# Patient Record
Sex: Male | Born: 1960 | Race: Black or African American | Hispanic: No | Marital: Single | Smoking: Former smoker
Health system: Southern US, Community
[De-identification: ages and names within clinical notes are randomized; demographics above are authoritative.]

## PROBLEM LIST (undated history)

## (undated) DIAGNOSIS — R0789 Other chest pain: Secondary | ICD-10-CM

## (undated) DIAGNOSIS — D689 Coagulation defect, unspecified: Secondary | ICD-10-CM

## (undated) DIAGNOSIS — I639 Cerebral infarction, unspecified: Secondary | ICD-10-CM

## (undated) DIAGNOSIS — Z8639 Personal history of other endocrine, nutritional and metabolic disease: Secondary | ICD-10-CM

## (undated) DIAGNOSIS — I10 Essential (primary) hypertension: Secondary | ICD-10-CM

## (undated) DIAGNOSIS — R29898 Other symptoms and signs involving the musculoskeletal system: Secondary | ICD-10-CM

## (undated) DIAGNOSIS — I69359 Hemiplegia and hemiparesis following cerebral infarction affecting unspecified side: Secondary | ICD-10-CM

## (undated) DIAGNOSIS — E119 Type 2 diabetes mellitus without complications: Secondary | ICD-10-CM

## (undated) DIAGNOSIS — E7800 Pure hypercholesterolemia, unspecified: Secondary | ICD-10-CM

## (undated) DIAGNOSIS — K2970 Gastritis, unspecified, without bleeding: Secondary | ICD-10-CM

## (undated) DIAGNOSIS — I69398 Other sequelae of cerebral infarction: Principal | ICD-10-CM

## (undated) DIAGNOSIS — D649 Anemia, unspecified: Secondary | ICD-10-CM

## (undated) DIAGNOSIS — N184 Chronic kidney disease, stage 4 (severe): Secondary | ICD-10-CM

## (undated) DIAGNOSIS — R002 Palpitations: Secondary | ICD-10-CM

## (undated) DIAGNOSIS — N289 Disorder of kidney and ureter, unspecified: Secondary | ICD-10-CM

## (undated) HISTORY — DX: Other symptoms and signs involving the musculoskeletal system: R29.898

## (undated) HISTORY — DX: Palpitations: R00.2

## (undated) HISTORY — DX: Personal history of other endocrine, nutritional and metabolic disease: Z86.39

## (undated) HISTORY — DX: Gastritis, unspecified, without bleeding: K29.70

## (undated) HISTORY — PX: COLONOSCOPY: SHX174

## (undated) HISTORY — PX: ESOPHAGOGASTRODUODENOSCOPY: SHX1529

## (undated) HISTORY — DX: Hemiplegia and hemiparesis following cerebral infarction affecting unspecified side: I69.359

## (undated) HISTORY — DX: Other chest pain: R07.89

## (undated) HISTORY — PX: AV FISTULA PLACEMENT: SHX1204

## (undated) HISTORY — DX: Coagulation defect, unspecified: D68.9

## (undated) HISTORY — DX: Other sequelae of cerebral infarction: I69.398

## (undated) HISTORY — DX: Anemia, unspecified: D64.9

## (undated) HISTORY — PX: LUMBAR SPINE SURGERY: SHX701

---
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
## 1999-07-26 ENCOUNTER — Emergency Department (HOSPITAL_COMMUNITY): Admission: EM | Admit: 1999-07-26 | Discharge: 1999-07-26 | Payer: Self-pay | Admitting: Podiatry

## 1999-07-27 ENCOUNTER — Encounter: Payer: Self-pay | Admitting: Emergency Medicine

## 2000-05-07 HISTORY — PX: REPAIR OF FRACTURED PENIS: SHX6063

## 2000-05-28 ENCOUNTER — Observation Stay (HOSPITAL_COMMUNITY): Admission: EM | Admit: 2000-05-28 | Discharge: 2000-05-28 | Payer: Self-pay | Admitting: Emergency Medicine

## 2004-05-11 ENCOUNTER — Observation Stay (HOSPITAL_COMMUNITY): Admission: RE | Admit: 2004-05-11 | Discharge: 2004-05-12 | Payer: Self-pay | Admitting: Specialist

## 2006-11-08 ENCOUNTER — Inpatient Hospital Stay (HOSPITAL_COMMUNITY): Admission: RE | Admit: 2006-11-08 | Discharge: 2006-11-09 | Payer: Self-pay | Admitting: Specialist

## 2008-11-19 ENCOUNTER — Inpatient Hospital Stay (HOSPITAL_COMMUNITY): Admission: AD | Admit: 2008-11-19 | Discharge: 2008-11-22 | Payer: Self-pay | Admitting: Specialist

## 2008-12-01 ENCOUNTER — Encounter: Payer: Self-pay | Admitting: Internal Medicine

## 2009-02-12 ENCOUNTER — Ambulatory Visit: Payer: Self-pay | Admitting: Internal Medicine

## 2009-02-12 DIAGNOSIS — K921 Melena: Secondary | ICD-10-CM

## 2009-02-12 DIAGNOSIS — E119 Type 2 diabetes mellitus without complications: Secondary | ICD-10-CM

## 2009-03-01 ENCOUNTER — Telehealth: Payer: Self-pay | Admitting: Internal Medicine

## 2009-03-03 ENCOUNTER — Telehealth: Payer: Self-pay | Admitting: Internal Medicine

## 2009-03-05 ENCOUNTER — Ambulatory Visit: Payer: Self-pay | Admitting: Internal Medicine

## 2010-10-06 ENCOUNTER — Other Ambulatory Visit: Payer: Self-pay | Admitting: Nephrology

## 2010-10-06 DIAGNOSIS — I10 Essential (primary) hypertension: Secondary | ICD-10-CM

## 2010-10-06 DIAGNOSIS — N179 Acute kidney failure, unspecified: Secondary | ICD-10-CM

## 2010-10-12 ENCOUNTER — Ambulatory Visit
Admission: RE | Admit: 2010-10-12 | Discharge: 2010-10-12 | Disposition: A | Discharge status: Home / Self Care | Payer: Managed Care, Other (non HMO) | Source: Ambulatory Visit | Attending: Nephrology | Admitting: Nephrology

## 2010-10-12 DIAGNOSIS — N179 Acute kidney failure, unspecified: Secondary | ICD-10-CM

## 2010-10-12 DIAGNOSIS — I10 Essential (primary) hypertension: Secondary | ICD-10-CM

## 2010-11-13 LAB — GLUCOSE, CAPILLARY
Glucose-Capillary: 27 mg/dL — CL (ref 70–99)
Glucose-Capillary: 29 mg/dL — CL (ref 70–99)
Glucose-Capillary: 39 mg/dL — CL (ref 70–99)
Glucose-Capillary: 42 mg/dL — ABNORMAL LOW (ref 70–99)

## 2010-11-16 LAB — BASIC METABOLIC PANEL
BUN: 10 mg/dL (ref 6–23)
BUN: 11 mg/dL (ref 6–23)
BUN: 17 mg/dL (ref 6–23)
CO2: 27 mEq/L (ref 19–32)
CO2: 29 mEq/L (ref 19–32)
CO2: 29 mEq/L (ref 19–32)
Calcium: 8.1 mg/dL — ABNORMAL LOW (ref 8.4–10.5)
Chloride: 106 mEq/L (ref 96–112)
Chloride: 108 mEq/L (ref 96–112)
Creatinine, Ser: 1.22 mg/dL (ref 0.4–1.5)
GFR calc non Af Amer: >60 mL/min (ref >60)
GFR calc non Af Amer: >60 mL/min (ref >60)
Glucose, Bld: 137 mg/dL — ABNORMAL HIGH (ref 70–99)
Glucose, Bld: 50 mg/dL — ABNORMAL LOW (ref 70–99)
Glucose, Bld: 185 mg/dL — ABNORMAL HIGH (ref 70–99)
Potassium: 3.5 mEq/L (ref 3.5–5.1)
Sodium: 137 mEq/L (ref 135–145)
Sodium: 138 mEq/L (ref 135–145)

## 2010-11-16 LAB — GLUCOSE, CAPILLARY
Glucose-Capillary: 114 mg/dL — ABNORMAL HIGH (ref 70–99)
Glucose-Capillary: 147 mg/dL — ABNORMAL HIGH (ref 70–99)
Glucose-Capillary: 149 mg/dL — ABNORMAL HIGH (ref 70–99)
Glucose-Capillary: 157 mg/dL — ABNORMAL HIGH (ref 70–99)
Glucose-Capillary: 195 mg/dL — ABNORMAL HIGH (ref 70–99)
Glucose-Capillary: 224 mg/dL — ABNORMAL HIGH (ref 70–99)
Glucose-Capillary: 358 mg/dL — ABNORMAL HIGH (ref 70–99)
Glucose-Capillary: 135 mg/dL — ABNORMAL HIGH (ref 70–99)
Glucose-Capillary: 143 mg/dL — ABNORMAL HIGH (ref 70–99)
Glucose-Capillary: 61 mg/dL — ABNORMAL LOW (ref 70–99)
Glucose-Capillary: 70 mg/dL (ref 70–99)

## 2010-11-16 LAB — CBC
HCT: 33 % — ABNORMAL LOW (ref 39.0–52.0)
HCT: 38.7 % — ABNORMAL LOW (ref 39.0–52.0)
Hemoglobin: 10 g/dL — ABNORMAL LOW (ref 13.0–17.0)
Hemoglobin: 10.4 g/dL — ABNORMAL LOW (ref 13.0–17.0)
MCHC: 31.6 g/dL (ref 30.0–36.0)
MCHC: 32.1 g/dL (ref 30.0–36.0)
MCHC: 31.8 g/dL (ref 30.0–36.0)
MCV: 79.2 fL (ref 78.0–100.0)
MCV: 79.9 fL (ref 78.0–100.0)
MCV: 79.7 fL (ref 78.0–100.0)
Platelets: 249 10*3/uL (ref 150–400)
RBC: 3.94 MIL/uL — ABNORMAL LOW (ref 4.22–5.81)
RBC: 4.13 MIL/uL — ABNORMAL LOW (ref 4.22–5.81)
RDW: 14.6 % (ref 11.5–15.5)
RDW: 14.9 % (ref 11.5–15.5)
WBC: 7.7 10*3/uL (ref 4.0–10.5)

## 2010-11-16 LAB — URINALYSIS, ROUTINE W REFLEX MICROSCOPIC
Bilirubin Urine: NEGATIVE
Ketones, ur: NEGATIVE mg/dL
Nitrite: NEGATIVE
Specific Gravity, Urine: 1.017 (ref 1.005–1.030)
Urobilinogen, UA: 0.2 mg/dL (ref 0.0–1.0)

## 2010-11-16 LAB — COMPREHENSIVE METABOLIC PANEL
Albumin: 3.5 g/dL (ref 3.5–5.2)
BUN: 22 mg/dL (ref 6–23)
Calcium: 9 mg/dL (ref 8.4–10.5)
Chloride: 107 mEq/L (ref 96–112)
Creatinine, Ser: 1.63 mg/dL — ABNORMAL HIGH (ref 0.4–1.5)
GFR calc non Af Amer: 45 mL/min — ABNORMAL LOW (ref >60)
Total Bilirubin: 0.7 mg/dL (ref 0.3–1.2)

## 2010-11-16 LAB — PROTIME-INR: INR: 1 (ref 0.00–1.49)

## 2010-11-16 LAB — APTT: aPTT: 26 seconds (ref 24–37)

## 2010-12-20 NOTE — Op Note (Signed)
Daniel, Santos NO.:  [id number]   MEDICAL RECORD NO.:  [id number]          PATIENT TYPE:  AMB   LOCATION:  DAY                          FACILITY:  [facility]   PHYSICIAN:  Jene Every, M.D.    DATE OF BIRTH:  1961-05-16   DATE OF PROCEDURE:  DATE OF DISCHARGE:                               OPERATIVE REPORT   PREOPERATIVE DIAGNOSIS:  Spinal stenosis, recurrent disk herniation,  status post decompression 4-5, 5-1.   POSTOPERATIVE DIAGNOSIS:  Spinal stenosis, recurrent disk herniation,  status post decompression 4-5, 5-1.   PROCEDURE PERFORMED:  1. Redo lumbar decompression L4-5 with bilateral hemilaminotomies,      lateral recess decompression, foraminotomies L4.  2. Redo lumbar decompression L5-S1 with bilateral hemilaminotomies,      lateral recess decompression, microdiskectomy L5-S1 left.   ANESTHESIA:  General.   ASSISTANT:  Strader.   BRIEF HISTORY AND INDICATIONS:  The patient is a 50 year old status post  lumbar decompression 4-5 and 5-1.  Has had progressive disk  degeneration.  Had spinal stenosis, central disk protrusion at 4-5,  recurrent disk herniation and small bony fragment L5-S1 on the right  compressing the S1 and 5 roots.  He is indicated for decompression and  has been refractory to conservative treatment.  Risks and benefits  discussed including bleeding, infection, damage to neurovascular  structure, CSF leakage, epidural fibrosis, adjacent segment disease,  need for fusion in the future, anesthetic complications, etc.   With the patient in supine position, after adequate general anesthesia  and 2 g of Kefzol, he was placed prone on the [place] frame.  All bony  prominences were well padded.  Lumbar region was prepped and draped in  the usual sterile fashion.  Incision was made from the spinous process  of L4 to S1.  Subcutaneous tissue was dissected, electrocautery utilized  to achieve hemostasis.  Dorsolumbar fascia identified  and divided in  line with the skin incision.  Paraspinous muscle elevated from lamina at  4-5 and 5-1.  Kocher's placed in the spinous processes of 4 and 5,  confirmed with x-ray.  It was determined preoperatively we would remove  the spinous processes of 4 and 5 and performed complete removal of the  lamina of 5.  He had previous decompression at 4-5 and 5-1 right.  Hemilaminotomy of the caudad edge of 4 was performed with 2-mm  Kerrison's attached to the ligamentum flavum.  We used the operating  microscope.  Ligament flavum attached from the cephalad edge of 5  utilizing the 2-mm Kerrison as well.  Removed the ligamentum flavum from  the interspace centrally after mobilization, protecting the neural  elements.  He had severe lateral recess stenosis bilaterally which was  decompressed at the medial border pedicle utilizing 2-mm Kerrison.  Bipolar electrocautery was utilized to achieve hemostasis.  Then passed  a hockey stick probe cephalad with good epidural fat, no cephalad  compression.  He did have a hard disk at 4-5 on both sides.  There was  fairly significant pressure of the 4 roots.  It was fairly significant,  especially  of the 4 and 5 roots into the lateral recess, and these  decompressed to the medial border of the pedicle.  Following this,  hockey stick probe was passed freely up the foramen of 4.  We then  turned our attention caudad, removing the hemi-lamina of 5 on the right  with a 2-mm Kerrison.  Ligamentum flavum was then removed from the  interspace at 5-1 on the left.  There was compression over the S1 nerve  root into the lateral recesses and decompressed the medial border of the  pedicle.  There was no evidence of a disk herniation here.  Hockey-stick  probe then passed freely at 5 and S1 on the left.  We turned our  attention to the right.  The small bony fragment at L5-S1 on the right  seemed to be the inferior aspect of the facet at that level.  This was   skeletonized and removed with a pituitary root, performed a foraminotomy  of S1 at the caudad edge, gently mobilizing the scar tissue with a  straight curette.  This was fairly significant encasing the S1 and 5  roots.  This was performed at 4-5 on the right as well.  After removal  of the hemi-lamina 5 as well on the right, we decompressed the lateral  recess and the medial border pedicle, took the 5 root out as well.  With  the neural elements well protected and mobilizing the epidural fibrosis,  we identified the disk space at 5-1, entered it with a Penfield 4 and  removed herniated disk material at 5-1 on the right.  This mobilized the  S1 and 5 roots adequately.  Hockey stick probe passed freely out the  foramen of S1 and 5.  Inspection revealed no evidence of CSF leakage or  active bleeding.  There was no instability of the spine noted.  This was  evaluated prior to the decompression as well with a Kocher on the  spinous process.  Next, bipolar cautery was utilized to achieve  hemostasis.  Thrombin-soaked Gelfoam was placed in the laminotomy  defect, removed the [facility] retractor.  Paraspinous muscles  inspected.  Electrocautery utilized to achieve hemostasis.  Was  irrigated copiously, and the wound was irrigated copiously throughout  the case.  Dorsolumbar fascia reapproximated with #1 Vicryl interrupted  figure-of-eight sutures.  Just on the proximal aspect, there was a  slight aperture of a centimeter to allow for evacuation of hematoma if  it occurred.  Subcutaneous tissue reapproximated with 2-0 Vicryl simple  sutures.  Skin was reapproximated with staples and dressed sterilely.  Placed supine on the hospital bed, extubated without difficulty and  transferred to recovery in satisfactory condition.  The patient  tolerated the procedure well.  There were no complications.   ASSISTANT:  Roma Schanz, P.A.   BLOOD LOSS:  100 mL.      Jene Every, M.D.   Electronically Signed     JB/MEDQ  D:  11/19/2008  T:  11/19/2008  Job:  [id number]

## 2010-12-23 NOTE — Discharge Summary (Signed)
NAMESYLVESTER, MINTON NO.:  [id number]   MEDICAL RECORD NO.:  [id number]          PATIENT TYPE:  INP   LOCATION:  1529                         FACILITY:  [facility]   PHYSICIAN:  Jene Every, M.D.    DATE OF BIRTH:  [birth year]   DATE OF ADMISSION:  11/19/2008  DATE OF DISCHARGE:  11/22/2008                               DISCHARGE SUMMARY   ADMISSION DIAGNOSES:  1. Severe spinal stenosis at L4-5 and 5-1.  2. Insulin-dependent diabetes.  3. Hypertension.  4. Hypercholesteremia.   DISCHARGE DIAGNOSES:  1. Severe spinal stenosis at L4-5 and 5-1.  2. Insulin-dependent diabetes.  3. Hypertension.  4. Hypercholesteremia.  5. Status post lumbar decompression at 4-5 and 5-1.  6. Resolved urinary retention.   HISTORY:  Daniel Santos is a 50 year old gentleman who is well-known to our  practice.  He has undergone previous lumbar decompression with good  relief of his symptoms, but unfortunately there is now recurrent pain  down to the right lower extremity.  He states pain is worse with  walking, better with standing.  MRI study does reveal significant  stenosis multifactorial at 4-5.  The patient also has lateral recess and  disk herniation at 5-1 on the right with synovial cysts.  I felt at this  point due to the disabling nature of the patient's symptoms that he  would benefit from lumbar decompression.  The risks and benefits of this  were discussed with the patient.  He does wish to proceed.   LABORATORY DATA:  Preoperative CBC shows white cell count 7.7,  hemoglobin 12.3, hematocrit of 8.7.  This was monitored throughout the  hospital course.  White cell count remains in normal range.  Hemoglobin  slightly decreased at 10.0, hematocrit 31.2.  Coagulation studies were  within normal range.  Preoperative chemistries showed sodium of 140,  potassium of 4.8, elevated glucose of 367.  This was nonfasting.  Elevated creatinine of 1.63.  These were monitored throughout  the  hospital course.  Sodium and potassium remained stable.  BUN remained  stable.  Creatinine stabilized at time of discharge to 1.13. Glucose did  fluctuate; high being the 367 on admission, the low being 50.  At time  of discharge serum glucose is 137.  Initial GFR was 45 prior to  discharge.  GFR is greater than 60.  Routine liver function tests within  normal range.  Preoperative urinalysis showed 250 mg/dL of glucose,  trace hemoglobin, 100 mg/dL of protein, rare bacteria, hyaline casts  were noted.  Preoperative EKG showed normal sinus rhythm.  Preoperative  chest x-ray showed no active lung disease.  Slight hyperaeration,  probable chronic bronchitis was noted.   PROCEDURE NOTE:  Lumbar decompression, redo 4-5 and 5-1.  Surgeon Dr.  Jene Every,  assistant Roma Schanz P.A.-C.  Anesthesia:  General.  Complications:  None.   HOSPITAL COURSE:  The patient was admitted, taken to the OR. and  underwent the above stated procedure without difficulty.  He was then  transferred to the PACU, and then to the orthopedic floor for continued  postoperative  care.  Postoperatively the patient did fairly well.  He  did have some issues with hypoglycemia.  This was treated accordingly  without incident.  Postoperative day #1, the patient did have low back  pain as expected.  He did note improvement in his lower extremity  symptoms.  The patient had been out of bed without difficulty, and he  was having some problems voiding at this time.  He denied any shortness  of breath or chest pain.  No silent seizure was noted.  Vital signs were  stable.  He had slight temp at 99.1 overnight.  Incision was clean and  dry.  Motor and neurovascular function was intact to the lower  extremity.  Calves were soft and nontender.  Abdomen was also nontender  and mildly distended.  At this point we did start Flomax IV, in and out  cath q.6, bladder scan if greater than 250 going in and out cath.  We  did  adjust his Lantus to avoid hyperglycemic event.  We did continue  with antibiotics secondary to drainage at incision.  Postop day #2, the  patient was doing better.  He noted decreased pain.  Unfortunately he  continued to have problems with urinary retention.  He had been in and  out cath __________ , but just voided about 200 mL prior to exam.  Vital  signs were stable.  Continued to have a low grade temp at 99.2.  Hemoglobin was stable.  Motor and neurovascular function remained  intact.  At this point we did adjust his medications.  We discontinued  the Robaxin.  We continued the Flomax.  We did start Urecholine.  On  postoperative day #3, the patient was doing much better.  He was voiding  without difficulty.  Did note significant decrease in his pain.  Vital  signs were stable.  He was afebrile.  Incision was clean, dry, and  intact.  No evidence of infection. Until this point the patient was  stable to be discharged home.   DISPOSITION:  The patient discharged home.   ACTIVITY:  Walk as tolerated.  Use back precautions.  He is to follow up  with Dr. Shelle Iron in approximately 10-14 days.   DISCHARGE MEDICATIONS:  Includes all home medications as well as:   1. Vitamin C 500 mg.  2. Percocet 1-2 p.o. q.4-6 p.r.n. pain.   CONDITION ON DISCHARGE:  Stable.   FINAL DIAGNOSIS:  Doing well status post lumbar decompression L4-5 and 5-  1.      Roma Schanz, P.A.      Jene Every, M.D.  Electronically Signed    CS/MEDQ  D:  12/17/2008  T:  12/17/2008  Job:  [id number]

## 2010-12-23 NOTE — Op Note (Signed)
Daniel Santos, Daniel Santos NO.:  [id number]   MEDICAL RECORD NO.:  [id number]          PATIENT TYPE:  INP   LOCATION:  0002                         FACILITY:  [facility]   PHYSICIAN:  Jene Every, M.D.    DATE OF BIRTH:  1960-10-03   DATE OF PROCEDURE:  11/08/2006  DATE OF DISCHARGE:                               OPERATIVE REPORT   PREOPERATIVE DIAGNOSES:  1. Spinal stenosis.  2. Recurrent disk herniation L5-S1 right and L4-5.   PROCEDURE PERFORMED:  Redo decompression L5-S1 with foraminotomy of S1,  microdiskectomy L5-S1, hemilaminectomy of L5 and decompression 4-5  right.   ANESTHESIA:  General.   ASSISTANT:  Roma Schanz, P.A.   BRIEF HISTORY AND INDICATION:  A 50 year old with S1-L5 radiculopathy  secondary to recurrent disk herniation of 5-1, lateral recess stenosis,  progressive disc degeneration, severe S1 and 5 root compression,  indicated for decompression.  Risks and benefits discussed including  bleeding, infection, damage to vascular structures, CSF leakage,  epidural fibrosis, adjacent segment disease, need for fusion in the  future, anesthetic complications, etc.   The patient in supine position after induction of adequate general  anesthesia and 500 mg IV vancomycin, placed prone on the [place] frame.  All bony prominences well-padded.  Lumbar area prepped and draped in the  usual sterile fashion.  Incision was made in spinous process 4-5  resecting the previous scar.  Subcutaneous tissue was dissected.  Electrocautery was utilized to achieve hemostasis.  Dorsolumbar fascia  identified and divided in line of the skin incision.  Paraspinous muscle  elevated from lamina of 4-5 and S1.  Epidural fibrosis removed from the  interspace at 5-1.  The bony landmarks were skeletonized with a curette.  Operating microscope was draped and brought into the surgical field.  Hemilaminotomy of 5 was performed. Due to shingling of 5, the disk  degeneration,  the compression, migration of the disk herniation, it was  felt necessary to remove the hemilamina of 5 on the right.  I performed  this with a 2 and 3 mm Kerrison.  There was significant stenosis in the  lateral recess noted.  I decompressed the lateral recess at 4-5, which  was fairly significant to the medial border of the pedicle, performed  foraminotomy of S1.  Identified the S1 nerve root, gently mobilized it  medially, identified the disk space and a large disk herniation  paracentral to the right was noted.  Protected the thecal sac, performed  annulotomy and large amount of fragments were retrieved with straight  and upbiting pituitary mobilized with a nerve hook.  This significantly  decompressed the thecal sac, S1 and S5 root.  Performed a foraminotomy  of 5 as well freeing the 5 root.  There was no significant disc  herniation at 4-5 so we left the disk intact.  The 4 and 5 root,  however, were free.  Good excursion of the S1 and 5 root at least a  centimeter medial to the pedicle without difficulty.  No CSF leakage or  active bleeding.  Wound was copiously irrigated.  McCullough retractor  removed, the paraspinous muscle was inspected with no evidence of active  bleeding.  Thrombin-soaked Gelfoam was placed in laminotomy defects.  The dorsolumbar fascia reapproximated with #1 Vicryl in figure-of-eight  sutures.  Subcutaneous tissue reapproximated  with 2-0 Vicryl simple sutures.  Skin reapproximated with staples.  Wound was dressed sterilely.  Placed supine on hospital bed, extubated  without difficulty and transported to the recovery room in satisfactory  condition.   The patient tolerated the procedure well with no complications.      Jene Every, M.D.  Electronically Signed     JB/MEDQ  D:  11/08/2006  T:  11/08/2006  Job:  [id number]

## 2010-12-23 NOTE — Op Note (Signed)
[facility]  Patient:    Daniel Santos, Daniel Santos                        MRN: [id number] Proc. Date: 05/27/00 Adm. Date:  [id number] Attending:  Benny Lennert                           Operative Report  PREOPERATIVE DIAGNOSIS:  Fractured penis.  POSTOPERATIVE DIAGNOSIS:  Fractured penis.  PROCEDURE:  Repair of fractured penis and cystoscopy.  SURGEON:  Jamison Neighbor, M.D.  ANESTHESIA:  General.  COMPLICATIONS:  None.  DRAINS:  None.  BRIEF HISTORY:  A 50 year old male heard a pop during intercourse and developed immediate swelling and bruising.  This occurred on Friday night. The patient did attempt to ejaculate a second time, which was successful, but he noted that he had swelling at the base of the penis and no ability to achieve an erection beyond the swollen point.  It is quite clear the patient has a fractured penis both by history and examination.  The patients _____ was normal with no blood at the meatus and no dysuria.  The patient is now to undergo repair of the fractured penis plus cystoscopy to ensure that the urethra is intact.  The patient understands the risks and benefits of the procedure.  Specifically, he has been informed that if he does nothing, he is guaranteed to have problems with erectile dysfunction and curvature of the penis.  He also realizes, however, that even with repair there is no guarantee that he will have normal erections postoperatively.  He gave full and informed consent.  DESCRIPTION OF PROCEDURE:  After successful induction of general anesthesia, the patient was placed in the supine position, prepped with Betadine, and draped in the usual sterile fashion.  A circumferential incision was made just below the glans penis, and the penis was degloved.  The patient was found to have a large clot underneath the [place] fascia on the left-hand side.  The [place] fascia was opened, exposing the clot, which was then evacuated.   The underlying injury to the corpora was identified and was closed with a series of interrupted sutures of 2-0 Vicryl.  The urethra was exposed directly adjacent to the injury, and there was no sign of extension into the urethra or to the corpus spongiosum.  The other side was inspected.  There was no evidence of any additional injury.  The patient had an artificial erection performed.  The erection was straight.  There was no sign of any leak from the area of repair, and there was no sign of any other injury to the corporal body.  The devitalized tissue was debrided, but very little of that was necessary.  The patient had a cystoscopic examination performed.  The urethra was visualized with the flexible cystoscope.  No injury to the urethra occurred.  Beyond the verumontanum, there was little if any prostatic enlargement.  The bladder was carefully inspected.  It was free of any tumor or stones.  Both ureteral orifices were normal.  The patient had a dorsal block placed.  He was then closed with quadrant sutures of 4-0 chromic followed by individual sutures of 4-0 chromic as done with a normal circumcision.  The patient had a dressing of Xeroform gauze, dry gauze, and Coban applied.  He tolerated the procedure well and was taken to the recovery room in good condition.  Due to the late hour, he will stay overnight.  He will be given a prescription for Tylox as well as Keflex and will return to the office in one weeks time. DD:  05/27/00 TD:  05/28/00 Job: [id number]

## 2010-12-23 NOTE — Op Note (Signed)
NAMEANAY, RATHE NO.:  [id number]   MEDICAL RECORD NO.:  [id number]          PATIENT TYPE:  AMB   LOCATION:  DAY                          FACILITY[facility]   PHYSICIAN:  Jene Every, M.D.    DATE OF BIRTH:  July 09, 1961   DATE OF PROCEDURE:  05/11/2004  DATE OF DISCHARGE:                                 OPERATIVE REPORT   PREOPERATIVE DIAGNOSIS:  Herniated nucleus pulposus, L5-S1, right.   POSTOPERATIVE DIAGNOSIS:  Herniated nucleus pulposus, L5-S1, right.   PROCEDURE PERFORMED:  Lateral recess, decompression, microdiskectomy, L5-S1,  right.   ANESTHESIA:  General.   ASSISTANT:  Roma Schanz, P.A.   BRIEF HISTORY/INDICATIONS:  This is a 50 year old with right lower extremity  radicular pain, L5-S1 nerve root distribution, neural compression at L5-S1.  Positive neural tension sign on MRI, indicating neural compression.  Operative intervention is indicated for decompression of the S1 nerve root.  Risks and benefits discussed including bleeding, infection, damage to nerve  or vascular structures, CSF leakage, epidural fibrosis, adjacent segment  disease, need for fusion in the future, etc.   TECHNIQUE:  With the patient in supine position after adequate general  anesthesia and 1 gm of Kefzol, he is placed prone on the [place] frame.  All  bony prominences are well padded.  The lumbar region is prepped and draped  in the usual sterile fashion.  An 18 gauge spinal needle is used to localize  the L5-S1 interspace, confirmed with x-ray.  The incision is made from the  spinous process of 5-1.  Subcutaneous tissue was dissected.  Electrocautery  was utilized to achieve hemostasis.  The dorsal lumbar fascia was identified  in the bilateral skin incision.  The paraspinous muscle was elevated from  the lamina of L5-S1.  A McCullough retractor is placed.  The operating  microscope is draped and brought into the surgical field.  The ligamentum  flavum is  detached from the cephalad edge of S1.  Confirmatory radiograph  obtained with the Penfield 4 in the interlaminar space.  A hemilaminectomy  of the caudad edge of 5 was performed, and with a 2-3 mm Kerrison, a  foraminotomy of S1 was performed as well with detachment of the cephalad  edge of the ligamentum flavum of S1.  Foraminotomy of S1 was performed.  The  nerve root was gently mobilized medially.  A focal HNP was noted at the disk  space.  Annulotomy was performed, and copious portion of the disk material  was removed from the disk space in a piece-meal fashion with straightening  up by the pituitaries; however, the patient still had some tension up on the  nerve root.  He was found, with further exploration, the axilla of the root,  a caudad extrusion and compression of the S1 nerve root.  I mobilized a free  fragment from between the axilla of the nerve root.  This was retrieved with  a pituitary rongeur.  This freed up the nerve root of S1 and had medial  excursion without difficulty.  Hockey stick probe placed in the foramen of  S1 and 5 found to be widely patent.  Bipolar electrocautery was utilized to  achieve hemostasis.  Copious portion of disk material was irrigated in the  disk space.  The disk space was copiously irrigated as well.  The disk space  was copiously irrigated.  Further checked the disk space beneath the thecal  sac and foramen of S1 and 5, found to be widely patent.  There was lateral  recess stenosis that was decompressed with 2-3 mm Kerrison.  Good excursion  of the root.  No further compression.  After copious irrigation, inspection  revealed no evidence of CSF leaks or active bleeding.  Thrombin-soaked  Gelfoam was placed in the laminotomy defect.  Inspection revealed no  evidence of CSF leakage or active bleeding.  The dorsal lumbar fascia was  reapproximated with #1 Vicryl figure-of-eight suture.  The subcutaneous  tissue was reapproximated with 2-0 Vicryl  simple sutures.  The skin was  reapproximated with 4-0 subcuticular Prolene.  It was reinforced with Steri-  Strips.  Sterile dressing applied.  Placed supine on the hospital bed.  Extubated without difficulty.  Transported to the recovery room in  satisfactory condition.  No complications.  Minimal blood loss.      JB/MEDQ  D:  05/11/2004  T:  05/11/2004  Job:  [id number]

## 2012-01-04 ENCOUNTER — Other Ambulatory Visit: Payer: Self-pay | Admitting: Nephrology

## 2012-01-04 DIAGNOSIS — N179 Acute kidney failure, unspecified: Secondary | ICD-10-CM

## 2012-01-08 ENCOUNTER — Ambulatory Visit
Admission: RE | Admit: 2012-01-08 | Discharge: 2012-01-08 | Disposition: A | Discharge status: Home / Self Care | Payer: Managed Care, Other (non HMO) | Source: Ambulatory Visit | Attending: Nephrology | Admitting: Nephrology

## 2012-01-08 DIAGNOSIS — N179 Acute kidney failure, unspecified: Secondary | ICD-10-CM

## 2015-01-12 ENCOUNTER — Emergency Department (HOSPITAL_COMMUNITY): Payer: BLUE CROSS/BLUE SHIELD

## 2015-01-12 ENCOUNTER — Encounter (HOSPITAL_COMMUNITY): Payer: Self-pay | Admitting: Emergency Medicine

## 2015-01-12 ENCOUNTER — Inpatient Hospital Stay (HOSPITAL_COMMUNITY)
Admission: EM | Admit: 2015-01-12 | Discharge: 2015-01-13 | DRG: 065 | Disposition: A | Discharge status: Home / Self Care | Payer: BLUE CROSS/BLUE SHIELD | Attending: Family Medicine | Admitting: Family Medicine

## 2015-01-12 ENCOUNTER — Inpatient Hospital Stay (HOSPITAL_COMMUNITY): Payer: BLUE CROSS/BLUE SHIELD

## 2015-01-12 DIAGNOSIS — N179 Acute kidney failure, unspecified: Secondary | ICD-10-CM | POA: Diagnosis present

## 2015-01-12 DIAGNOSIS — E119 Type 2 diabetes mellitus without complications: Secondary | ICD-10-CM | POA: Diagnosis present

## 2015-01-12 DIAGNOSIS — E785 Hyperlipidemia, unspecified: Secondary | ICD-10-CM | POA: Diagnosis not present

## 2015-01-12 DIAGNOSIS — N189 Chronic kidney disease, unspecified: Secondary | ICD-10-CM | POA: Diagnosis present

## 2015-01-12 DIAGNOSIS — Z794 Long term (current) use of insulin: Secondary | ICD-10-CM

## 2015-01-12 DIAGNOSIS — I639 Cerebral infarction, unspecified: Principal | ICD-10-CM | POA: Diagnosis present

## 2015-01-12 DIAGNOSIS — Z9641 Presence of insulin pump (external) (internal): Secondary | ICD-10-CM | POA: Diagnosis present

## 2015-01-12 DIAGNOSIS — Z881 Allergy status to other antibiotic agents status: Secondary | ICD-10-CM | POA: Diagnosis not present

## 2015-01-12 DIAGNOSIS — L0390 Cellulitis, unspecified: Secondary | ICD-10-CM | POA: Diagnosis present

## 2015-01-12 DIAGNOSIS — I739 Peripheral vascular disease, unspecified: Secondary | ICD-10-CM | POA: Diagnosis present

## 2015-01-12 DIAGNOSIS — Z720 Tobacco use: Secondary | ICD-10-CM | POA: Diagnosis present

## 2015-01-12 DIAGNOSIS — R531 Weakness: Secondary | ICD-10-CM

## 2015-01-12 DIAGNOSIS — I129 Hypertensive chronic kidney disease with stage 1 through stage 4 chronic kidney disease, or unspecified chronic kidney disease: Secondary | ICD-10-CM | POA: Diagnosis present

## 2015-01-12 DIAGNOSIS — Z7982 Long term (current) use of aspirin: Secondary | ICD-10-CM

## 2015-01-12 DIAGNOSIS — E780 Pure hypercholesterolemia: Secondary | ICD-10-CM | POA: Diagnosis present

## 2015-01-12 DIAGNOSIS — E1143 Type 2 diabetes mellitus with diabetic autonomic (poly)neuropathy: Secondary | ICD-10-CM | POA: Diagnosis not present

## 2015-01-12 DIAGNOSIS — E1065 Type 1 diabetes mellitus with hyperglycemia: Secondary | ICD-10-CM

## 2015-01-12 DIAGNOSIS — M6289 Other specified disorders of muscle: Secondary | ICD-10-CM | POA: Diagnosis not present

## 2015-01-12 DIAGNOSIS — IMO0002 Reserved for concepts with insufficient information to code with codable children: Secondary | ICD-10-CM

## 2015-01-12 DIAGNOSIS — I10 Essential (primary) hypertension: Secondary | ICD-10-CM | POA: Diagnosis not present

## 2015-01-12 DIAGNOSIS — I6350 Cerebral infarction due to unspecified occlusion or stenosis of unspecified cerebral artery: Secondary | ICD-10-CM | POA: Diagnosis present

## 2015-01-12 HISTORY — DX: Essential (primary) hypertension: I10

## 2015-01-12 HISTORY — DX: Type 2 diabetes mellitus without complications: E11.9

## 2015-01-12 HISTORY — DX: Disorder of kidney and ureter, unspecified: N28.9

## 2015-01-12 HISTORY — DX: Pure hypercholesterolemia, unspecified: E78.00

## 2015-01-12 LAB — BASIC METABOLIC PANEL
Anion gap: 10 (ref 5–15)
BUN: 52 mg/dL — ABNORMAL HIGH (ref 6–20)
CO2: 21 mmol/L — AB (ref 22–32)
CREATININE: 3.16 mg/dL — AB (ref 0.61–1.24)
Calcium: 9.5 mg/dL (ref 8.9–10.3)
Chloride: 106 mmol/L (ref 101–111)
GFR calc Af Amer: 24 mL/min — ABNORMAL LOW (ref >60)
GFR calc non Af Amer: 21 mL/min — ABNORMAL LOW (ref >60)
GLUCOSE: 206 mg/dL — AB (ref 65–99)
Potassium: 5 mmol/L (ref 3.5–5.1)
SODIUM: 137 mmol/L (ref 135–145)

## 2015-01-12 LAB — GLUCOSE, CAPILLARY
Glucose-Capillary: 413 mg/dL — ABNORMAL HIGH (ref 65–99)
Glucose-Capillary: 310 mg/dL — ABNORMAL HIGH (ref 65–99)

## 2015-01-12 LAB — CBC
HCT: 35.9 % — ABNORMAL LOW (ref 39.0–52.0)
HEMATOCRIT: 32.1 % — AB (ref 39.0–52.0)
Hemoglobin: 11.6 g/dL — ABNORMAL LOW (ref 13.0–17.0)
Hemoglobin: 10.3 g/dL — ABNORMAL LOW (ref 13.0–17.0)
MCH: 25.1 pg — AB (ref 26.0–34.0)
MCH: 24.2 pg — AB (ref 26.0–34.0)
MCHC: 32.3 g/dL (ref 30.0–36.0)
MCHC: 32.1 g/dL (ref 30.0–36.0)
MCV: 77.5 fL — AB (ref 78.0–100.0)
MCV: 75.5 fL — ABNORMAL LOW (ref 78.0–100.0)
PLATELETS: 175 10*3/uL (ref 150–400)
Platelets: 163 10*3/uL (ref 150–400)
RBC: 4.63 MIL/uL (ref 4.22–5.81)
RBC: 4.25 MIL/uL (ref 4.22–5.81)
RDW: 14.3 % (ref 11.5–15.5)
RDW: 14.1 % (ref 11.5–15.5)
WBC: 5.8 10*3/uL (ref 4.0–10.5)
WBC: 6.6 10*3/uL (ref 4.0–10.5)

## 2015-01-12 LAB — CREATININE, SERUM
Creatinine, Ser: 2.93 mg/dL — ABNORMAL HIGH (ref 0.61–1.24)
GFR calc non Af Amer: 23 mL/min — ABNORMAL LOW (ref >60)
GFR, EST AFRICAN AMERICAN: 26 mL/min — AB (ref >60)

## 2015-01-12 LAB — CBG MONITORING, ED: Glucose-Capillary: 173 mg/dL — ABNORMAL HIGH (ref 65–99)

## 2015-01-12 MED ORDER — SENNOSIDES-DOCUSATE SODIUM 8.6-50 MG PO TABS: 1.0000 | ORAL_TABLET | Freq: Every evening | ORAL | Status: DC | PRN

## 2015-01-12 MED ORDER — ATORVASTATIN CALCIUM 10 MG PO TABS
20.0000 mg | ORAL_TABLET | Freq: Every day | ORAL | Status: DC
  Administered 2015-01-12 – 2015-01-13 (×2): 20 mg via ORAL
  Filled 2015-01-12 (×2): qty 2

## 2015-01-12 MED ORDER — STROKE: EARLY STAGES OF RECOVERY BOOK
Freq: Once | Status: AC
  Administered 2015-01-13: 06:00:00

## 2015-01-12 MED ORDER — INSULIN PUMP
SUBCUTANEOUS | Status: DC
  Administered 2015-01-12 – 2015-01-13 (×5): via SUBCUTANEOUS
  Filled 2015-01-12: qty 1

## 2015-01-12 MED ORDER — INSULIN PUMP: SUBCUTANEOUS | Status: DC

## 2015-01-12 MED ORDER — HEPARIN SODIUM (PORCINE) 5000 UNIT/ML IJ SOLN
5000.0000 [IU] | Freq: Three times a day (TID) | INTRAMUSCULAR | Status: DC
  Administered 2015-01-12 – 2015-01-13 (×3): 5000 [IU] via SUBCUTANEOUS
  Filled 2015-01-12 (×3): qty 1

## 2015-01-12 MED ORDER — BUPRENORPHINE 15 MCG/HR TD PTWK: 1.0000 | MEDICATED_PATCH | TRANSDERMAL | Status: DC

## 2015-01-12 MED ORDER — SODIUM CHLORIDE 0.9 % IV SOLN
INTRAVENOUS | Status: DC
  Administered 2015-01-12: 23:00:00 via INTRAVENOUS

## 2015-01-12 MED ORDER — SODIUM CHLORIDE 0.9 % IV BOLUS (SEPSIS)
1000.0000 mL | Freq: Once | INTRAVENOUS | Status: AC
  Administered 2015-01-12: 1000 mL via INTRAVENOUS

## 2015-01-12 MED ORDER — PREGABALIN 75 MG PO CAPS: 75.0000 mg | ORAL_CAPSULE | Freq: Two times a day (BID) | ORAL | Status: DC | PRN

## 2015-01-12 MED ORDER — ASPIRIN 300 MG RE SUPP: 300.0000 mg | Freq: Every day | RECTAL | Status: DC

## 2015-01-12 MED ORDER — AMLODIPINE BESYLATE 10 MG PO TABS
10.0000 mg | ORAL_TABLET | Freq: Every day | ORAL | Status: DC
  Administered 2015-01-13: 10 mg via ORAL
  Filled 2015-01-12: qty 1

## 2015-01-12 MED ORDER — ASPIRIN 325 MG PO TABS
325.0000 mg | ORAL_TABLET | Freq: Every day | ORAL | Status: DC
  Administered 2015-01-12 – 2015-01-13 (×2): 325 mg via ORAL
  Filled 2015-01-12 (×2): qty 1

## 2015-01-12 MED ORDER — SODIUM CHLORIDE 0.9 % IV SOLN
INTRAVENOUS | Status: AC
  Administered 2015-01-12: 18:00:00 via INTRAVENOUS

## 2015-01-12 MED ORDER — ASPIRIN 81 MG PO CHEW
324.0000 mg | CHEWABLE_TABLET | Freq: Once | ORAL | Status: AC
  Administered 2015-01-12: 324 mg via ORAL
  Filled 2015-01-12: qty 4

## 2015-01-12 MED ORDER — FERROUS SULFATE 325 (65 FE) MG PO TABS
325.0000 mg | ORAL_TABLET | Freq: Every day | ORAL | Status: DC
  Administered 2015-01-13: 325 mg via ORAL
  Filled 2015-01-12: qty 1

## 2015-01-12 NOTE — Progress Notes (Signed)
Patient left the floor for a MRA.

## 2015-01-12 NOTE — ED Provider Notes (Signed)
Year CSN: [id number]     Arrival date & time 01/12/15  1032 History   First MD Initiated Contact with Patient 01/12/15 1242     Chief Complaint  Patient presents with  . Weakness     (Consider location/radiation/quality/duration/timing/severity/associated sxs/prior Treatment) HPI Comments: 54 year old male with diabetes on insulin pump,  High  blood pressure, high cholesterol, kidney disease cellulitis of the left with left-sided weakness. Patient had episode yesterday where he gave himself extra insulin and had low glucose when he woke from a nap.  Nonsmoker.  Patient noticed that since yesterday he has had persistent mild left-sided weakness initially he attributed to low glucose however his glucose is normalized in this persist. No stroke history. Nothing has improved his mild symptoms.   Patient is a 54 y.o. male presenting with weakness. The history is provided by the patient.  Weakness Pertinent negatives include no chest pain, no abdominal pain, no headaches and no shortness of breath.    Past Medical History  Diagnosis Date  . Diabetes mellitus without complication   . Hypertension   . Hypercholesteremia   . Renal disorder    History reviewed. No pertinent past surgical history. History reviewed. No pertinent family history. History  Substance Use Topics  . Smoking status: Never Smoker   . Smokeless tobacco: Not on file  . Alcohol Use: No    Review of Systems  Constitutional: Negative for fever and chills.  HENT: Negative for congestion.   Eyes: Negative for visual disturbance.  Respiratory: Negative for shortness of breath.   Cardiovascular: Negative for chest pain.  Gastrointestinal: Negative for vomiting and abdominal pain.  Genitourinary: Negative for dysuria and flank pain.  Musculoskeletal: Positive for gait problem. Negative for back pain, neck pain and neck stiffness.  Skin: Negative for rash.  Neurological: Positive for weakness. Negative for  light-headedness and headaches.      Allergies  Doxycycline  Home Medications   Prior to Admission medications   Medication Sig Start Date End Date Taking? Authorizing Provider  amLODipine (NORVASC) 10 MG tablet Take 10 mg by mouth daily.   Yes Historical Provider, MD  aspirin EC 81 MG tablet Take 81 mg by mouth daily.   Yes Historical Provider, MD  atorvastatin (LIPITOR) 20 MG tablet Take 20 mg by mouth daily.   Yes Historical Provider, MD  Buprenorphine 15 MCG/HR PTWK Place 1 patch onto the skin every 7 (seven) days.   Yes Historical Provider, MD  calcitRIOL (ROCALTROL) 0.25 MCG capsule Take 0.25 mcg by mouth 3 (three) times a week. Monday, Wednesday, Friday   Yes Historical Provider, MD  carvedilol (COREG) 25 MG tablet Take 25 mg by mouth 2 (two) times daily with a meal.   Yes Historical Provider, MD  ferrous sulfate 325 (65 FE) MG tablet Take 325 mg by mouth daily with breakfast.   Yes Historical Provider, MD  HUMALOG 100 UNIT/ML injection Inject into the skin.  10/19/14  Yes Historical Provider, MD  Insulin Human (INSULIN PUMP) SOLN Inject into the skin. Humulog Insulin Pump   Yes Historical Provider, MD  LINZESS 145 MCG CAPS capsule Take 1 tablet by mouth daily as needed. constipation 10/14/14  Yes Historical Provider, MD  Minocycline HCl 80 MG TB24 Take 1 tablet by mouth daily.   Yes Historical Provider, MD  pregabalin (LYRICA) 75 MG capsule Take 75 mg by mouth 2 (two) times daily as needed (pain).   Yes Historical Provider, MD  promethazine (PHENERGAN) 25 MG tablet Take 25 mg  by mouth every 6 (six) hours as needed for nausea or vomiting.   Yes Historical Provider, MD   BP 132/74 mmHg  Pulse 70  Temp(Src) 98.1 F (36.7 C) (Oral)  Resp 18  SpO2 100% Physical Exam  Constitutional: He is oriented to person, place, and time. He appears well-developed and well-nourished.  HENT:  Head: Normocephalic and atraumatic.  Eyes: Conjunctivae are normal. Right eye exhibits no discharge.  Left eye exhibits no discharge.  Neck: Normal range of motion. Neck supple. No tracheal deviation present.  Cardiovascular: Normal rate and regular rhythm.   Pulmonary/Chest: Effort normal and breath sounds normal.  Abdominal: Soft. He exhibits no distension. There is no tenderness. There is no guarding.  Musculoskeletal: He exhibits no edema.  Neurological: He is alert and oriented to person, place, and time. Coordination and gait normal. GCS eye subscore is 4. GCS verbal subscore is 5. GCS motor subscore is 6.  5+ strength in UE and LE with f/e at major joints. Sensation to palpation intact in UE and LE. CNs 2-12 grossly intact.  EOMFI.  PERRL.   Finger nose and coordination intact bilateral.   Visual fields intact to finger testing.   Skin: Skin is warm. No rash noted.  Psychiatric: He has a normal mood and affect.  Nursing note and vitals reviewed.   ED Course  Procedures (including critical care time) Labs Review Labs Reviewed  CBC - Abnormal; Notable for the following:    Hemoglobin 11.6 (*)    HCT 35.9 (*)    MCV 77.5 (*)    MCH 25.1 (*)    All other components within normal limits  BASIC METABOLIC PANEL - Abnormal; Notable for the following:    CO2 21 (*)    Glucose, Bld 206 (*)    BUN 52 (*)    Creatinine, Ser 3.16 (*)    GFR calc non Af Amer 21 (*)    GFR calc Af Amer 24 (*)    All other components within normal limits  CBG MONITORING, ED - Abnormal; Notable for the following:    Glucose-Capillary 173 (*)    All other components within normal limits  CBG MONITORING, ED    Imaging Review Ct Head Wo Contrast  01/12/2015   CLINICAL DATA:  Left-sided weakness and unsteady gait began yesterday, some improvement  EXAM: CT HEAD WITHOUT CONTRAST  TECHNIQUE: Contiguous axial images were obtained from the base of the skull through the vertex without intravenous contrast.  COMPARISON:  None.  FINDINGS: The ventricular system is normal in size and configuration and the septum  is midline in position. The fourth ventricle and basilar cisterns are unremarkable. No hemorrhage, mass lesion, or acute infarction is seen. There may be very mild small vessel disease within the periventricular white matter. On bone window images, there is complete opacification of the right maxillary sinus with somewhat thickened walls, most consistent with chronic right maxillary sinusitis. The remainder of the paranasal sinuses are well pneumatized. No calvarial abnormality is seen.  IMPRESSION: 1. No acute intracranial abnormality. Question mild small vessel disease of the periventricular white matter. 2. 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   Electronically Signed   By: Dwyane DeePaul  Barry M.D.   On: 01/12/2015 11:57     EKG Interpretation None      MDM   Final diagnoses:  Left-sided weakness  DM II (diabetes mellitus, type II), controlled  CRF (chronic renal failure), unspecified stage   Patient presents with concern for small  stroke with persistent left-sided weakness and mild gait changes since yesterday. Patient has no focal findings on exam. Plan for blood work, MRI to look for stroke. Patient has worsening kidney function however no recent blood work. Patient is followed by nephrology outpatient for worsening kidney disease. Will attempt to obtain more recent kidney function.  Discussed with on-call nephrology reviewed recent lab work kidney function was in the upper twos, assuming no stroke on MRI they're comfortable following this up outpatient. The patients results and plan were reviewed and discussed.   Any x-rays performed were personally reviewed by myself.   Pt signed out to fup MRI results for dispo. Differential diagnosis were considered with the presenting HPI.  Medications  sodium chloride 0.9 % bolus 1,000 mL (0 mLs Intravenous Stopped 01/12/15 1529)    Filed Vitals:   01/12/15 1044 01/12/15 1314  BP: 137/89 132/74  Pulse: 72 70  Temp: 98.1 F  (36.7 C) 98.1 F (36.7 C)  TempSrc: Oral Oral  Resp: 16 18  SpO2: 100% 100%    Final diagnoses:  Left-sided weakness  DM II (diabetes mellitus, type II), controlled  CRF (chronic renal failure), unspecified stage    Blane Ohara, MD 01/12/15 [phone]

## 2015-01-12 NOTE — H&P (Addendum)
PATIENT DETAILS Name: Daniel Santos Age: 54 y.o. Sex: male Date of Birth: 1960-08-29 Admit Date: 01/12/2015 ZOX:[id number] N, MD Referring Physician:Dr. Karma Ganja   CHIEF COMPLAINT:  Left sided weakness since 3 PM 01/11/15  HPI: Daniel Santos is a 54 y.o. male with a Past Medical History of diabetes on insulin pump, hypertension, suspected chronic kidney disease, ongoing tobacco use, prior history of cocaine use (claims last use approximately 2-3 years back) who presents today with the above noted complaint. Per patient, around 3 PM yesterday he noticed that his left side of his body was slightly weaker than his right side. He had repeated this to perhaps hypoglycemia (did not check his CBG) and crank sugary drinks. He felt somewhat better, but continued to have some intermittent weakness of his left side. He subsequently went to bed to see if he could just sleep it off, however he woke up this morning with ongoing mild left-sided weakness. He subsequently went to work where he continued to stumble and have intermittent unsteady gait, he felt that his left side was slightly weaker than his right. He then proceeded to the ED for further evaluation, where MRI of the brain showed a possible right paramedian pontine infarct. I was subsequently asked to admit this patient for further evaluation and treatment.  Patient denies any visual symptoms, he denies any dysarthria.  ALLERGIES:   Allergies  Allergen Reactions  . Doxycycline Nausea Only    (kidney function?)    PAST MEDICAL HISTORY: Past Medical History  Diagnosis Date  . Diabetes mellitus without complication   . Hypertension   . Hypercholesteremia   . Renal disorder     PAST SURGICAL HISTORY: History reviewed. No pertinent past surgical history.  MEDICATIONS AT HOME: Prior to Admission medications   Medication Sig Start Date End Date Taking? Authorizing Provider  amLODipine (NORVASC) 10 MG tablet Take 10 mg by  mouth daily.   Yes Historical Provider, MD  aspirin EC 81 MG tablet Take 81 mg by mouth daily.   Yes Historical Provider, MD  atorvastatin (LIPITOR) 20 MG tablet Take 20 mg by mouth daily.   Yes Historical Provider, MD  Buprenorphine 15 MCG/HR PTWK Place 1 patch onto the skin every 7 (seven) days.   Yes Historical Provider, MD  carvedilol (COREG) 25 MG tablet Take 25 mg by mouth 2 (two) times daily with a meal.   Yes Historical Provider, MD  ferrous sulfate 325 (65 FE) MG tablet Take 325 mg by mouth daily with breakfast.   Yes Historical Provider, MD  HUMALOG 100 UNIT/ML injection Inject into the skin.  10/19/14  Yes Historical Provider, MD  Insulin Human (INSULIN PUMP) SOLN Inject into the skin. Humulog Insulin Pump   Yes Historical Provider, MD  LINZESS 145 MCG CAPS capsule Take 1 tablet by mouth daily as needed. constipation 10/14/14  Yes Historical Provider, MD  Minocycline HCl 80 MG TB24 Take 1 tablet by mouth daily.   Yes Historical Provider, MD  pregabalin (LYRICA) 75 MG capsule Take 75 mg by mouth 2 (two) times daily as needed (pain).   Yes Historical Provider, MD  promethazine (PHENERGAN) 25 MG tablet Take 25 mg by mouth every 6 (six) hours as needed for nausea or vomiting.   Yes Historical Provider, MD    FAMILY HISTORY: Grandmother-CVA   SOCIAL HISTORY: +smoker He reports that he does not drink alcohol or use illicit drugs. Lives at: Home Mobility: Independent  REVIEW OF  SYSTEMS:  Constitutional:   No  weight loss, night sweats,  Fevers, chills, fatigue.  HEENT:    No headaches, Dysphagia,Tooth/dental problems,Sore throat,  No sneezing, itching, ear ache, nasal congestion, post nasal drip  Cardio-vascular: No chest pain,Orthopnea, PND,lower extremity edema, anasarca, palpitations  GI:  No heartburn, indigestion, abdominal pain, nausea, vomiting, diarrhea, melena or hematochezia  Resp: No shortness of breath, cough, hemoptysis,plueritic chest pain.   Skin:  No rash or  lesions.  GU:  No dysuria, change in color of urine, no urgency or frequency.  No flank pain.  Musculoskeletal: No joint pain or swelling.  No decreased range of motion.  No back pain.  Endocrine: No heat intolerance, no cold intolerance, no polyuria, no polydipsia  Psych: No change in mood or affect. No depression or anxiety.  No memory loss.   PHYSICAL EXAM: Blood pressure 141/82, pulse 71, temperature 98 F (36.7 C), temperature source Oral, resp. rate 19, SpO2 99 %.  General appearance :Awake, alert, not in any distress. Speech Clear. Not toxic Looking HEENT: Atraumatic and Normocephalic, pupils equally reactive to light and accomodation Neck: supple, no JVD. No cervical lymphadenopathy.  Chest:Good air entry bilaterally, no added sounds  CVS: S1 S2 regular, no murmurs.  Abdomen: Bowel sounds present, Non tender and not distended with no gaurding, rigidity or rebound. Extremities: B/L Lower Ext shows no edema, both legs are warm to touch Neurology:  Non focal-gait not assessed Skin:No Rash Wounds:N/A  LABS ON ADMISSION:   Recent Labs  01/12/15 1134  NA 137  K 5.0  CL 106  CO2 21*  GLUCOSE 206*  BUN 52*  CREATININE 3.16*  CALCIUM 9.5   No results for input(s): AST, ALT, ALKPHOS, BILITOT, PROT, ALBUMIN in the last 72 hours. No results for input(s): LIPASE, AMYLASE in the last 72 hours.  Recent Labs  01/12/15 1134  WBC 5.8  HGB 11.6*  HCT 35.9*  MCV 77.5*  PLT 175   No results for input(s): CKTOTAL, CKMB, CKMBINDEX, TROPONINI in the last 72 hours. No results for input(s): DDIMER in the last 72 hours. Invalid input(s): POCBNP   RADIOLOGIC STUDIES ON ADMISSION: Ct Head Wo Contrast  01/12/2015   CLINICAL DATA:  Left-sided weakness and unsteady gait began yesterday, some improvement  EXAM: CT HEAD WITHOUT CONTRAST  TECHNIQUE: Contiguous axial images were obtained from the base of the skull through the vertex without intravenous contrast.  COMPARISON:  None.   FINDINGS: The ventricular system is normal in size and configuration and the septum is midline in position. The fourth ventricle and basilar cisterns are unremarkable. No hemorrhage, mass lesion, or acute infarction is seen. There may be very mild small vessel disease within the periventricular white matter. On bone window images, there is complete opacification of the right maxillary sinus with somewhat thickened walls, most consistent with chronic right maxillary sinusitis. The remainder of the paranasal sinuses are well pneumatized. No calvarial abnormality is seen.  IMPRESSION: 1. No acute intracranial abnormality. Question mild small vessel disease of the periventricular white matter. 2. 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   Electronically Signed   By: Dwyane DeePaul  Barry M.D.   On: 01/12/2015 11:57   Mr Brain Wo Contrast  01/12/2015   CLINICAL DATA:  Left-sided weakness and unsteady gait beginning at 1500 hours yesterday. Symptoms are incompletely resolved.  EXAM: MRI HEAD WITHOUT CONTRAST  TECHNIQUE: Multiplanar, multiecho pulse sequences of the brain and surrounding structures were obtained without intravenous contrast.  COMPARISON:  CT head  without contrast 01/12/2015  FINDINGS: The 14 mm acute/ subacute nonhemorrhagic right paramedian pontine infarct is present. T2 hyperintensity is associated with the infarct. More chronic T2 changes are present bilaterally in the lower pons and bilateral cerebral peduncle is. Mild to moderate age advanced periventricular T2 changes are present as well. There scattered subcortical T2 changes bilaterally.  No acute hemorrhage or mass lesion is present.  The ventricles are of normal size. No significant extraaxial fluid collection is present.  Flow is present in the major intracranial arteries. The globes and orbits are intact. Mild mucosal thickening is present in the anterior ethmoid air cells. The right maxillary sinus is chronically opacified. The left  maxillary sinus is clear. Minimal fluid is present in the left mastoid air cells. No obstructing nasopharyngeal lesion is present.  The skullbase is within normal limits. Midline images demonstrate moderate degenerative changes in the upper cervical spine. Intracranial midline structures are normal.  IMPRESSION: 1. 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 2. Age advanced white matter disease. Given the acute infarct in extension in the brainstem, this likely reflects the sequela of chronic microvascular ischemia. 3. Chronic right maxillary sinus opacification. 4. Left mastoid effusion. No obstructing nasopharyngeal lesion is present. These results were called by telephone at the time of interpretation on 01/12/2015 at 4:03 pm to ABIGAIL, ED PA, who verbally acknowledged these results.   Electronically Signed   By: Marin Roberts M.D.   On: 01/12/2015 16:24    EKG: Personally reviewed-12 EKG  ASSESSMENT AND PLAN: Present on Admission:  . Acute CVA (cerebrovascular accident): Symptoms started around 3 PM on 6/6-nonfocal exam. On 81 mg of aspirin at home, will admit to telemetry, increase aspirin to 325 mg. Will check MRA brain, echocardiogram, carotid Doppler, A1c and lipid panel. Given prior history of drug use, check urinary drug screen. Await further recommendations from neurology   . Essential hypertension: Allow permissive hypertension-will continue with amlodipine, but discontinue Coreg.   Marland Kitchen History of type 2 diabetes: Continue insulin pump, follow CBGs and await A1c  . Acute renal failure versus chronic kidney disease stage IV: No old records available, will gently hydrate and recheck lites in a.m. Check UA to see if any proteinuria. If renal failure persists despite hydration, will need renal ultrasound and probably will need to obtain records from PCPs office.   . Dyslipidemia: Continue Lipitor, await lipid panel.  . Tobacco abuse: Counseled.   Further plan  will depend as patient's clinical course evolves and further radiologic and laboratory data become available. Patient will be monitored closely.  Above noted plan was discussed with patient face to face at bedside,he wasin agreement.   CONSULTS: /Neuro  DVT Prophylaxis: Prophylactic Heparin  Code Status: Full Code  Disposition Plan:  Discharge back home in 1-2 days  Total time spent  55 minutes.Greater than 50% of this time was spent in counseling, explanation of diagnosis, planning of further management, and coordination of care.  [facility] Pager [phone]  If 7PM-7AM, please contact night-coverage [email or web address] Password [facility] 01/12/2015, 6:14 PM

## 2015-01-12 NOTE — ED Notes (Signed)
Patient is still with MRI and I will update vitals when he returns.

## 2015-01-12 NOTE — Consult Note (Signed)
Stroke Consult    Chief Complaint:  HPI: Daniel Santos is an 54 y.o. male with history of HTN, DM, presenting for evaluation of left sided weakness and gait instability since 1500 yesterday. He initially attributed symptoms to low glucose but the weakness persisted so he came to the ED. MRI brain completed in ED and imaging reviewed. Shows an acute/subacute infarct in the right paramedian pons. No prior CVA or TIA history. Takes a daily ASA 81mg  at home.    Date last known well: 01/11/2015 Time last known well: 1500 tPA Given: no, outside IV tPA window  Past Medical History  Diagnosis Date  . Diabetes mellitus without complication   . Hypertension   . Hypercholesteremia   . Renal disorder     History reviewed. No pertinent past surgical history.  History reviewed. No pertinent family history. Social History:  reports that he has never smoked. He does not have any smokeless tobacco history on file. He reports that he does not drink alcohol or use illicit drugs.  Allergies:  Allergies  Allergen Reactions  . Doxycycline Nausea Only    (kidney function?)     (Not in a hospital admission)  ROS: Out of a complete 14 system review, the patient complains of only the following symptoms, and all other reviewed systems are negative. +weakness, gait instability  Physical Examination: Filed Vitals:   01/12/15 1600  BP: 141/82  Pulse: 71  Temp: 98 F (36.7 C)  Resp: 19   Physical Exam  Constitutional: He appears well-developed and well-nourished.  Psych: Affect appropriate to situation Eyes: No scleral injection HENT: No OP obstrucion Head: Normocephalic.  Cardiovascular: Normal rate and regular rhythm.  Respiratory: Effort normal and breath sounds normal.  GI: Soft. Bowel sounds are normal. No distension. There is no tenderness.  Skin: WDI  Neurologic Examination: Mental Status: Alert, oriented, thought content appropriate.  Speech fluent without evidence of aphasia.   Able to follow 3 step commands without difficulty. Cranial Nerves: II: funduscopic exam wnl bilaterally, visual fields grossly normal, pupils equal, round, reactive to light and accommodation III,IV, VI: ptosis not present, extra-ocular motions intact bilaterally V,VII: smile symmetric, facial light touch sensation normal bilaterally VIII: hearing normal bilaterally IX,X: gag reflex present XI: trapezius strength/neck flexion strength normal bilaterally XII: tongue strength normal  Motor: Right : Upper extremity    Left:     Upper extremity 5/5 deltoid       5-/5 deltoid 5/5 biceps      5-/5 biceps  5/5 triceps      5/5 triceps 5/5 hand grip      5/5 hand grip  Lower extremity     Lower extremity 5/5 hip flexor      5-/5 hip flexor 5/5 quadricep      5-/5 quadriceps  5/5 hamstrings     5-/5 hamstrings 5/5 plantar flexion       5/5 plantar flexion 5/5 plantar extension     5/5 plantar extension Tone and bulk:normal tone throughout; no atrophy noted Sensory: Pinprick and light touch intact throughout, bilaterally Deep Tendon Reflexes: 2+ and symmetric throughout Plantars: Right: downgoing   Left: downgoing Cerebellar: normal finger-to-nose, normal rapid alternating movements and normal heel-to-shin test Gait: normal gait and station  Laboratory Studies:   Basic Metabolic Panel:  Recent Labs Lab 01/12/15 1134  NA 137  K 5.0  CL 106  CO2 21*  GLUCOSE 206*  BUN 52*  CREATININE 3.16*  CALCIUM 9.5    Liver Function  Tests: No results for input(s): AST, ALT, ALKPHOS, BILITOT, PROT, ALBUMIN in the last 168 hours. No results for input(s): LIPASE, AMYLASE in the last 168 hours. No results for input(s): AMMONIA in the last 168 hours.  CBC:  Recent Labs Lab 01/12/15 1134  WBC 5.8  HGB 11.6*  HCT 35.9*  MCV 77.5*  PLT 175    Cardiac Enzymes: No results for input(s): CKTOTAL, CKMB, CKMBINDEX, TROPONINI in the last 168 hours.  BNP: Invalid input(s):  POCBNP  CBG:  Recent Labs Lab 01/12/15 1048  GLUCAP 173*    Microbiology: No results found for this or any previous visit.  Coagulation Studies: No results for input(s): LABPROT, INR in the last 72 hours.  Urinalysis: No results for input(s): COLORURINE, LABSPEC, PHURINE, GLUCOSEU, HGBUR, BILIRUBINUR, KETONESUR, PROTEINUR, UROBILINOGEN, NITRITE, LEUKOCYTESUR in the last 168 hours.  Invalid input(s): APPERANCEUR  Lipid Panel:  No results found for: CHOL, TRIG, HDL, CHOLHDL, VLDL, LDLCALC  HgbA1C: No results found for: HGBA1C  Urine Drug Screen:  No results found for: LABOPIA, COCAINSCRNUR, LABBENZ, AMPHETMU, THCU, LABBARB  Alcohol Level: No results for input(s): ETH in the last 168 hours.  Other results:  Imaging: Ct Head Wo Contrast  01/12/2015   CLINICAL DATA:  Left-sided weakness and unsteady gait began yesterday, some improvement  EXAM: CT HEAD WITHOUT CONTRAST  TECHNIQUE: Contiguous axial images were obtained from the base of the skull through the vertex without intravenous contrast.  COMPARISON:  None.  FINDINGS: The ventricular system is normal in size and configuration and the septum is midline in position. The fourth ventricle and basilar cisterns are unremarkable. No hemorrhage, mass lesion, or acute infarction is seen. There may be very mild small vessel disease within the periventricular white matter. On bone window images, there is complete opacification of the right maxillary sinus with somewhat thickened walls, most consistent with chronic right maxillary sinusitis. The remainder of the paranasal sinuses are well pneumatized. No calvarial abnormality is seen.  IMPRESSION: 1. No acute intracranial abnormality. Question mild small vessel disease of the periventricular white matter. 2. 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   Electronically Signed   By: Dwyane Dee M.D.   On: 01/12/2015 11:57   Mr Brain Wo Contrast  01/12/2015   CLINICAL DATA:   Left-sided weakness and unsteady gait beginning at 1500 hours yesterday. Symptoms are incompletely resolved.  EXAM: MRI HEAD WITHOUT CONTRAST  TECHNIQUE: Multiplanar, multiecho pulse sequences of the brain and surrounding structures were obtained without intravenous contrast.  COMPARISON:  CT head without contrast 01/12/2015  FINDINGS: The 14 mm acute/ subacute nonhemorrhagic right paramedian pontine infarct is present. T2 hyperintensity is associated with the infarct. More chronic T2 changes are present bilaterally in the lower pons and bilateral cerebral peduncle is. Mild to moderate age advanced periventricular T2 changes are present as well. There scattered subcortical T2 changes bilaterally.  No acute hemorrhage or mass lesion is present.  The ventricles are of normal size. No significant extraaxial fluid collection is present.  Flow is present in the major intracranial arteries. The globes and orbits are intact. Mild mucosal thickening is present in the anterior ethmoid air cells. The right maxillary sinus is chronically opacified. The left maxillary sinus is clear. Minimal fluid is present in the left mastoid air cells. No obstructing nasopharyngeal lesion is present.  The skullbase is within normal limits. Midline images demonstrate moderate degenerative changes in the upper cervical spine. Intracranial midline structures are normal.  IMPRESSION: 1. 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 2. Age advanced white matter disease. Given the acute infarct in extension in the brainstem, this likely reflects the sequela of chronic microvascular ischemia. 3. Chronic right maxillary sinus opacification. 4. Left mastoid effusion. No obstructing nasopharyngeal lesion is present. These results were called by telephone at the time of interpretation on 01/12/2015 at 4:03 pm to ABIGAIL, ED PA, who verbally acknowledged these results.   Electronically Signed   By: Marin Roberts M.D.   On:  01/12/2015 16:24    Assessment: 54 y.o. male with history of HTN, DM presenting for evaluation of acute onset of left sided weakness. MRI imaging shows acute/subacute infarct in the right pons. Suspect small vessel disease as etiology. Will admit for further stroke workup.    Plan: 1. HgbA1c, fasting lipid panel 2. MRI, MRA  of the brain without contrast 3. PT consult, OT consult, Speech consult 4. Echocardiogram 5. Carotid dopplers 6. Prophylactic therapy-ASA  daily 7. Risk factor modification 8. Telemetry monitoring 9. Frequent neuro checks 10. NPO until RN stroke swallow screen    Elspeth Cho, DO [facility] [phone]  If 7pm- 7am, please page neurology on call as listed in AMION. 01/12/2015, 5:49 PM

## 2015-01-12 NOTE — ED Notes (Signed)
Pt states that he is diabetic.  Yesterday, began having lt sided weakness and unsteady gait around 1500.  States that it has gotten better but it is not completely resolved.  Pt was ambulatory to triage with steady gait.  No slurred speech, no facial droop.  States that he believes that his sugar might be low.  States that he wears an insulin pump.  Pt's CBG was 173.

## 2015-01-12 NOTE — Progress Notes (Signed)
Patient is back to the unit from [facility].

## 2015-01-12 NOTE — ED Notes (Signed)
Report given to [facility] 

## 2015-01-12 NOTE — ED Notes (Signed)
Family at bedside. 

## 2015-01-12 NOTE — ED Provider Notes (Signed)
4:52 PM pt with acute stroke on MRI.  I have discussed these findings with the patient, he feels that he needs to leave to take care of some other issues.  I have discussed the results with him at length, and advised admission to the hospital due to the acute stroke.  Discussed with patient and Jeanene ErbJason Upham, RN.  Pt is deciding whether he will stay for admission or not.    5:32 PM pt has decided to stay.  d/w Dr. Hosie PoissonSumner, neurohospitalist,  [facility], will contact hospitalist for admission.    5:41 PM d/w Dr. Jerral RalphGhimire, [facility] he will see patient, to be transferred to [facility] at [facility].   Jerelyn ScottMartha Linker, MD 01/12/15 [phone]

## 2015-01-12 NOTE — Discharge Instructions (Signed)
Follow up closely with your kidney doctor, no nsaids (ie. Motrin, ibuprofen.) If you were given medicines take as directed.  If you are on coumadin or contraceptives realize their levels and effectiveness is altered by many different medicines.  If you have any reaction (rash, tongues swelling, other) to the medicines stop taking and see a physician.    If your blood pressure was elevated in the ER make sure you follow up for management with a primary doctor or return for chest pain, shortness of breath or stroke symptoms.  Please follow up as directed and return to the ER or see a physician for new or worsening symptoms.  Thank you. Filed Vitals:   01/12/15 1044 01/12/15 1314  BP: 137/89 132/74  Pulse: 72 70  Temp: 98.1 F (36.7 C) 98.1 F (36.7 C)  TempSrc: Oral Oral  Resp: 16 18  SpO2: 100% 100%

## 2015-01-12 NOTE — ED Notes (Signed)
Carb modified meal placed for this patient per Barbara CowerJason Upham...klj

## 2015-01-13 ENCOUNTER — Encounter: Payer: Self-pay | Admitting: Family Medicine

## 2015-01-13 ENCOUNTER — Ambulatory Visit (HOSPITAL_COMMUNITY): Payer: BLUE CROSS/BLUE SHIELD

## 2015-01-13 DIAGNOSIS — I639 Cerebral infarction, unspecified: Principal | ICD-10-CM

## 2015-01-13 DIAGNOSIS — N179 Acute kidney failure, unspecified: Secondary | ICD-10-CM

## 2015-01-13 DIAGNOSIS — I10 Essential (primary) hypertension: Secondary | ICD-10-CM

## 2015-01-13 DIAGNOSIS — E1143 Type 2 diabetes mellitus with diabetic autonomic (poly)neuropathy: Secondary | ICD-10-CM

## 2015-01-13 DIAGNOSIS — Z720 Tobacco use: Secondary | ICD-10-CM

## 2015-01-13 LAB — LIPID PANEL
CHOL/HDL RATIO: 2.5 ratio
Cholesterol: 95 mg/dL (ref 0–200)
HDL: 38 mg/dL — ABNORMAL LOW (ref >40)
LDL Cholesterol: 39 mg/dL (ref 0–99)
Triglycerides: 88 mg/dL (ref <150)
VLDL: 18 mg/dL (ref 0–40)

## 2015-01-13 LAB — GLUCOSE, CAPILLARY
GLUCOSE-CAPILLARY: 275 mg/dL — AB (ref 65–99)
GLUCOSE-CAPILLARY: 145 mg/dL — AB (ref 65–99)
Glucose-Capillary: 285 mg/dL — ABNORMAL HIGH (ref 65–99)
Glucose-Capillary: 190 mg/dL — ABNORMAL HIGH (ref 65–99)
Glucose-Capillary: 183 mg/dL — ABNORMAL HIGH (ref 65–99)

## 2015-01-13 LAB — RAPID URINE DRUG SCREEN, HOSP PERFORMED
Amphetamines: NOT DETECTED
BARBITURATES: NOT DETECTED
Benzodiazepines: NOT DETECTED
COCAINE: NOT DETECTED
Opiates: NOT DETECTED
Tetrahydrocannabinol: NOT DETECTED

## 2015-01-13 MED ORDER — CLOPIDOGREL BISULFATE 75 MG PO TABS: 75.0000 mg | ORAL_TABLET | Freq: Every day | ORAL | Status: DC

## 2015-01-13 MED ORDER — ENSURE ENLIVE PO LIQD
237.0000 mL | Freq: Two times a day (BID) | ORAL | Status: DC
  Administered 2015-01-13 (×2): 237 mL via ORAL

## 2015-01-13 NOTE — Progress Notes (Signed)
Patient arrived to 4N20 at 2025, alert and oriented. Patient oriented to room and equipment. Vital signs stable.

## 2015-01-13 NOTE — Progress Notes (Signed)
VASCULAR LAB PRELIMINARY  PRELIMINARY  PRELIMINARY  PRELIMINARY  Carotid duplex completed.    Preliminary report:  Bilateral:  1-39% ICA stenosis.  Vertebral artery flow is antegrade.     Daniel Santos, RVS 01/13/2015, 1:28 PM

## 2015-01-13 NOTE — Progress Notes (Signed)
  Echocardiogram 2D Echocardiogram has been performed.  Daniel Santos 01/13/2015, 11:50 AM

## 2015-01-13 NOTE — Evaluation (Signed)
Physical Therapy Evaluation and discharge Patient Details Name: Daniel Santos MRN: [id number] DOB: 18-May-1961 Today's Date: 01/13/2015   History of Present Illness  Daniel Santos is an 54 y.o. male with history of HTN, DM, presenting for evaluation of left sided weakness and gait instability since 1500 yesterday (LKW 01/11/2015 at 1500). He initially attributed symptoms to low glucose but the weakness persisted so he came to the ED. MRI brain completed in ED and imaging reviewed. Shows an acute/subacute infarct in the right paramedian pons.    Clinical Impression  Pt functioning at baseline. Denies vision deficits, dizziness or any numbness/tingling. Pt at minimal fall risk as indicated by 24/24 on DGI. Pt safe to d/c home with family once medically cleared. Pt with no further acute PT needs at this time. Please re-consult if needed in future.    Follow Up Recommendations No PT follow up;Supervision - Intermittent    Equipment Recommendations  None recommended by PT    Recommendations for Other Services       Precautions / Restrictions Precautions Precautions: None Restrictions Weight Bearing Restrictions: No      Mobility  Bed Mobility Overal bed mobility: Modified Independent             General bed mobility comments: pt with safe technique, no difficulties  Transfers Overall transfer level: Modified independent Equipment used: None             General transfer comment: safe technique, no difficulties or instability  Ambulation/Gait Ambulation/Gait assistance: Modified independent (Device/Increase time) Ambulation Distance (Feet): 500 Feet Assistive device: None Gait Pattern/deviations: WFL(Within Functional Limits) Gait velocity: wfl Gait velocity interpretation: at or above normal speed for age/gender General Gait Details: no episodes of LOB or instability  Stairs Stairs: Yes Stairs assistance: Modified independent (Device/Increase time) Stair Management:  No rails Number of Stairs: 12 General stair comments: no episodes of instability  Wheelchair Mobility    Modified Rankin (Stroke Patients Only) Modified Rankin (Stroke Patients Only) Pre-Morbid Rankin Score: No symptoms Modified Rankin: No symptoms     Balance Overall balance assessment: No apparent balance deficits (not formally assessed)                               Standardized Balance Assessment Standardized Balance Assessment : Dynamic Gait Index   Dynamic Gait Index Level Surface: Normal Change in Gait Speed: Normal Gait with Horizontal Head Turns: Normal Gait with Vertical Head Turns: Normal Gait and Pivot Turn: Normal Step Over Obstacle: Normal Step Around Obstacles: Normal Steps: Normal Total Score: 24       Pertinent Vitals/Pain Pain Assessment: No/denies pain    Home Living Family/patient expects to be discharged to:: Private residence Living Arrangements: Spouse/significant other Available Help at Discharge: Family;Available 24 hours/day Type of Home: House Home Access: Stairs to enter Entrance Stairs-Rails: None Entrance Stairs-Number of Steps: 6 Home Layout: One level Home Equipment: None      Prior Function Level of Independence: Independent         Comments: works third shift      Hand Dominance   Dominant Hand: Right    Extremity/Trunk Assessment   Upper Extremity Assessment: Overall WFL for tasks assessed           Lower Extremity Assessment: Overall WFL for tasks assessed      Cervical / Trunk Assessment: Normal  Communication   Communication: No difficulties  Cognition Arousal/Alertness: Awake/alert Behavior During Therapy:  WFL for tasks assessed/performed Overall Cognitive Status: Within Functional Limits for tasks assessed                      General Comments General comments (skin integrity, edema, etc.): pt with reports of L tip of pointer finger numbness    Exercises         Assessment/Plan    PT Assessment Patent does not need any further PT services  PT Diagnosis Generalized weakness   PT Problem List    PT Treatment Interventions     PT Goals (Current goals can be found in the Care Plan section) Acute Rehab PT Goals Patient Stated Goal: home asap PT Goal Formulation: All assessment and education complete, DC therapy    Frequency     Barriers to discharge        Co-evaluation               End of Session   Activity Tolerance: Patient tolerated treatment well Patient left: in bed;with call bell/phone within reach;with family/visitor present Nurse Communication: Mobility status         Time: [id number] PT Time Calculation (min) (ACUTE ONLY): 18 min   Charges:   PT Evaluation $Initial PT Evaluation Tier I: 1 Procedure     PT G CodesMarcene Brawn:        Daniel Santos 01/13/2015, 3:44 PM  Daniel Santos, PT, DPT Pager #: [phone] Office #: [phone]

## 2015-01-13 NOTE — Progress Notes (Signed)
Rounding Note:   Spoke with patient regarding diabetes and home regimen for diabetes management.  Patient states that he is followed by Dr. Romero BellingBalin for diabetes management. Patient uses an Animas Vibe insulin pump with Humalog insulin as an outpatient. Patient has insulin pump connected to his slight right upper quadrant of abdomen. Last time patient changes site was Monday 6/6. Patient does not have supplies at bedside but can easily get them if needed. Patient had to remove his glucose sensor for MRI. Patient is appropriate to operate his insulin pump while inpatient. RN to chart in the insulin pump management tab in flow sheets Q shift and chart carb intake in same column.  Current insulin pump settings are as follows:  Basal insulin settings 12A 0.375 units/hour 05A 0.425 units/hour 10A 0.375 units/hour 16P 0.400 units/hour 20P 0.400 units/hour  Total daily basal insulin: 9.85 units/24 hours  Carb Coverage 1:13 1 unit for every 13 grams of carbohydrates  Insulin Sensitivity 1:60 1 unit drops blood glucose 60 mg/dl  Target Glucose Goal 161140 mg/dl   Thanks,  Christena DeemShannon Chaddrick Brue RN, MSN, [facility] Inpatient Diabetes Coordinator Team Pager [phone]

## 2015-01-13 NOTE — Progress Notes (Signed)
Patient packed all belongings. Patient and family given discharge information, and all questions answered. Patient assisted to car by nurse tech. [place]

## 2015-01-13 NOTE — Evaluation (Signed)
Speech Language Pathology Evaluation Patient Details Name: Agnes LawrenceJohn A Gores MRN: [id number] DOB: 03/16/1961 Today's Date: 01/13/2015 Time: 1150-1208 SLP Time Calculation (min) (ACUTE ONLY): 18 min  Problem List:  Patient Active Problem List   Diagnosis Date Noted  . Acute CVA (cerebrovascular accident) 01/12/2015  . Essential hypertension 01/12/2015  . Acute renal failure 01/12/2015  . Tobacco abuse 01/12/2015  . Diabetes mellitus 02/12/2009  . BLOOD IN STOOL 02/12/2009   Past Medical History:  Past Medical History  Diagnosis Date  . Diabetes mellitus without complication   . Hypertension   . Hypercholesteremia   . Renal disorder    Past Surgical History: History reviewed. No pertinent past surgical history. HPI:  54 y.o. male with history of HTN, DM, presenting for evaluation of left sided weakness and gait instability.  MRI brain shows an acute/subacute infarct in the right paramedian pons.    Assessment / Plan / Recommendation Clinical Impression  Pt assessed with MOCA - scored 30/30 on areas of executive functioning, planning, short-term/working memory and attention.  Speech is clear; no dysarthria.  Pt reports no difficulty swallowing.  NoSLP f/u recommended.      SLP Assessment  Patient does not need any further Speech Lanaguage Pathology Services    Follow Up Recommendations  None     SLP Evaluation Prior Functioning  Cognitive/Linguistic Baseline: Within functional limits Vocation: Full time employment   Cognition  Overall Cognitive Status: Within Functional Limits for tasks assessed Orientation Level: Oriented X4    Comprehension  Auditory Comprehension Overall Auditory Comprehension: Appears within functional limits for tasks assessed Visual Recognition/Discrimination Discrimination: Within Function Limits Reading Comprehension Reading Status: Within funtional limits    Expression Expression Primary Mode of Expression: Verbal Verbal Expression Overall  Verbal Expression: Appears within functional limits for tasks assessed Written Expression Dominant Hand: Right Written Expression: Within Functional Limits   Oral / Motor Oral Motor/Sensory Function Overall Oral Motor/Sensory Function: Appears within functional limits for tasks assessed Motor Speech Overall Motor Speech: Appears within functional limits for tasks assessed        Blenda MountsCouture, Krysteena Stalker Laurice 01/13/2015, 12:11 PM

## 2015-01-13 NOTE — Progress Notes (Signed)
STROKE TEAM PROGRESS NOTE   HISTORY Agnes LawrenceJohn A Sloop is an 54 y.o. male with history of HTN, DM, presenting for evaluation of left sided weakness and gait instability since 1500 yesterday (LKW 01/11/2015 at 1500). He initially attributed symptoms to low glucose but the weakness persisted so he came to the ED. MRI brain completed in ED and imaging reviewed. Shows an acute/subacute infarct in the right paramedian pons. No prior CVA or TIA history. Takes a daily ASA 81mg  at home. Patient was not administered TPA secondary to delay in arrival. He was admitted for further evaluation and treatment.   SUBJECTIVE (INTERVAL HISTORY) Two ladies at the bedside left during rounds. Overall he feels his condition is stable.    OBJECTIVE Temp:  [97.8 F (36.6 C)-98.2 F (36.8 C)] 98.2 F (36.8 C) (06/08 0909) Pulse Rate:  [69-82] 69 (06/08 0909) Cardiac Rhythm:  [-] Normal sinus rhythm (06/07 2100) Resp:  [17-20] 20 (06/08 0909) BP: (128-149)/(75-85) 130/75 mmHg (06/08 0909) SpO2:  [97 %-100 %] 100 % (06/08 0909) Weight:  [65.318 kg (144 lb)] 65.318 kg (144 lb) (06/07 1502)   Recent Labs Lab 01/12/15 2154 01/13/15 0143 01/13/15 0408 01/13/15 0731 01/13/15 1208  GLUCAP 310* 285* 190* 183* 275*    Recent Labs Lab 01/12/15 1134 01/12/15 2248  NA 137  --   K 5.0  --   CL 106  --   CO2 21*  --   GLUCOSE 206*  --   BUN 52*  --   CREATININE 3.16* 2.93*  CALCIUM 9.5  --    No results for input(s): AST, ALT, ALKPHOS, BILITOT, PROT, ALBUMIN in the last 168 hours.  Recent Labs Lab 01/12/15 1134 01/12/15 2248  WBC 5.8 6.6  HGB 11.6* 10.3*  HCT 35.9* 32.1*  MCV 77.5* 75.5*  PLT 175 163   No results for input(s): CKTOTAL, CKMB, CKMBINDEX, TROPONINI in the last 168 hours. No results for input(s): LABPROT, INR in the last 72 hours. No results for input(s): COLORURINE, LABSPEC, PHURINE, GLUCOSEU, HGBUR, BILIRUBINUR, KETONESUR, PROTEINUR, UROBILINOGEN, NITRITE, LEUKOCYTESUR in the last 72  hours.  Invalid input(s): APPERANCEUR     Component Value Date/Time   CHOL 95 01/13/2015 0425   TRIG 88 01/13/2015 0425   HDL 38* 01/13/2015 0425   CHOLHDL 2.5 01/13/2015 0425   VLDL 18 01/13/2015 0425   LDLCALC 39 01/13/2015 0425   No results found for: HGBA1C    Component Value Date/Time   LABOPIA NONE DETECTED 01/13/2015 0020   COCAINSCRNUR NONE DETECTED 01/13/2015 0020   LABBENZ NONE DETECTED 01/13/2015 0020   AMPHETMU NONE DETECTED 01/13/2015 0020   THCU NONE DETECTED 01/13/2015 0020   LABBARB NONE DETECTED 01/13/2015 0020    No results for input(s): ETH in the last 168 hours.  Dg Chest 2 View  01/12/2015    Negative chest x-ray.     Ct Head Wo Contrast  01/12/2015   1. No acute intracranial abnormality. Question mild small vessel disease of the periventricular white matter. 2. 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     Mr Brain Wo Contrast  01/12/2015   1. 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 2. Age advanced white matter disease. Given the acute infarct in extension in the brainstem, this likely reflects the sequela of chronic microvascular ischemia. 3. Chronic right maxillary sinus opacification. 4. Left mastoid effusion. No obstructing nasopharyngeal lesion is present.   Mr Maxine GlennMra Head/brain Wo Cm  01/12/2015    1. No large vessel  occlusion or significant atheromatous disease within the intracranial circulation. Both the anterior and posterior circulation are widely patent. 2. Hypoplastic right A1 segment, with the anterior cerebral arteries supplied primarily via the left internal carotid artery system.     Carotid Doppler  There is 1-39% bilateral ICA stenosis. Vertebral artery flow is antegrade.    2D Echocardiogram  - Left ventricle: The cavity size was normal. Wall thickness wasincreased in a pattern of mild LVH. The estimated ejectionfraction was 65%. Wall motion was normal; there were no regionalwall motion  abnormalities. - Right ventricle: The cavity size was normal. Systolic functionwas normal. - Pulmonary arteries: PA peak pressure: 40 mm Hg (S). Impressions: No cardiac source of embolism was identified, butcannot be ruled out on the basis of this examination.  PHYSICAL EXAM Physical exam: Exam: Gen: NAD Eyes: anicteric sclerae, moist conjunctivae                    CV: no MRG, no carotid bruits, no peripheral edema Mental Status: Alert, follows commands, good historian  Neuro: Detailed Neurologic Exam  Speech:    No aphasia, no dysarthria  Cranial Nerves:    The pupils are equal, round, and reactive to light.. Attempted, Fundi flat.  EOMI. No gaze preference. Visual fields full. Face symmetric, Tongue midline. Hearing intact to voice. Shoulder shrug intact  Motor Observation:    no involuntary movements noted. Tone appears normal.     Strength:    5/5     Sensation:  Intact to LT  Plantars downgoing.     ASSESSMENT/PLAN Mr. HEBER HOOG is a 54 y.o. male with history of HTN, DM, presenting for evaluation of left sided weakness and gait instability. He did not receive IV t-PA due to delay in arrival.   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 Recommend change to Plavix 75 mg daily. Will start tomorrow. Order added.  Patient counseled to be compliant with his antithrombotic medications  Ongoing aggressive stroke risk factor management  Therapy recommendations:  No  therapy needs  Disposition:  Return home. Ok for discharge from stroke standpoint  Follow up with Dr. Roda Shutters in 2 months, stroke clinic, order written.  Stroke team will sign off  Essential Hypertension  Home meds:   Norvasc, coreg, minocycline  Stable  Hyperlipidemia  Home meds:  lipitor 20,  resumed in hospital  LDL 39, goal < 70  Continue statin at discharge  Diabetes type II  HgbA1c pending , goal < 7.0  Other Stroke Risk Factors  Tobacco abuse  Family hx stroke (grandmother)  Other Active Problems  Acute renal failure vs chronic kidney disease stage IV  Hospital day # 1  BIBY,SHARON  [facility] See Amion for Pager information 01/13/2015 1:47 PM   Personally examined patient and images, and have participated in and made any corrections needed to history, physical, neuro exam,assessment and plan as stated above.  Naomie Dean, MD Stroke Neurology [phone] [facility]  To contact Stroke Continuity provider, please refer to [email or web address] After hours, contact General Neurology

## 2015-01-13 NOTE — Discharge Summary (Signed)
Physician Discharge Summary  Daniel Santos XBJ:[id number] DOB: May 29, 1961 DOA: 01/12/2015  PCP: Gwynneth Aliment, MD  Admit date: 01/12/2015 Discharge date: 01/13/2015  Time spent: 35 minutes  Recommendations for Outpatient Follow-up:  1. Needs follow-up with nephrologist Dr. Abel Presto in 1-2 weeks 2. Needs follow-up with endocrinologist Dr. Talmage Nap for insulin pump adjustment and monitoring of A1c probably in about one to 2 months 3. Will need Chem-7 in about one week 4. Will continue Plavix which is the only new medication given on dischargePatient was given a prescription for this on discharge  5. patient was given a work letter excusing him until 01/16/15   Discharge Diagnoses:  Active Problems:   Diabetes mellitus   Acute CVA (cerebrovascular accident)   Essential hypertension   Acute renal failure   Tobacco abuse   Discharge Condition: d doing well  Diet recommendation: 0 heart healthy low-salt diabetic  There were no vitals filed for this visit. 54 y/o ? DM ty II since 1992 on Insulin Pump for about 2 years followed by endocrinology, Htn, CKD followed by nephrology for the past year and a half or so, TOb/Cocaine use known prior Spinal stenosis s/p multil surgeries 2008 [Dr. Beane], Fractured shaft of penis s/p repair 2001 admitted c CVA symptoms 01/12/15--MRI brain=14 mm R paramedian Pontine infarct.    On admission GFR noted to 21 from baseline >60- Had neg RAS screening 2013  Also noted microcytic anemia Patient was admitted overnight   after coming from the emergency room because of left leg weakness starting 01/11/15. 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 Because of his persisting symptoms despite the fact that her sugar was within normal limits, he came to the emergency room where an MRI was done showing a right paramedian pontine  infarct. Neurologist was consulted and recommended complete workup which has been performed. Patient was noted to have acute kidney injury based on his last labs in 2010 however is actually been followed by nephrology as an outpatient for the past year or year and a half and is supposed to see the nephrologist coming up soon. It is noted that his creatinine has budged slightly and has come down from 3 to about 2.9.-Patient is very insistent on going home. I did explain to him carefully the new medication he has to take his for prevention of strokes and he can get this at his pharmacy which have called it into. He was hemodynamically stable otherwise for discharge home and I have counseled and cautioned him to follow-up with his primary care physician for Chem-7 in about one week     Discharge Exam: Filed Vitals:   01/13/15 1418  BP: 132/70  Pulse: 71  Temp: 98.5 F (36.9 C)  Resp: 20    General: Alert pleasant oriented no apparent distress Cardiovascular: 0 S1-S2 no murmur rub or gallop Respiratory: 0 clinically clear no added sound no focal deficit, moving all 4 limbs equally. No weakness at present time  Discharge Instructions   Discharge Instructions    Ambulatory referral to Neurology    Complete by:  As directed   Dr. Roda Shutters requests followup in 2 months     Diet - low sodium heart healthy    Complete by:  As directed      Discharge instructions    Complete by:  As directed   You had a  stroke See the neurologist as needed Please follow up withy your nephrologist and your endocrinologist Main thing is close follow up with Primary MD in the near future for care coordination     Increase activity slowly    Complete by:  As directed           Current Discharge Medication List    START taking these medications   Details  clopidogrel (PLAVIX) 75 MG tablet Take 1 tablet (75 mg total) by mouth daily. Qty: 30 tablet, Refills: 0      CONTINUE these medications which have NOT  CHANGED   Details  amLODipine (NORVASC) 10 MG tablet Take 10 mg by mouth daily.    aspirin EC 81 MG tablet Take 81 mg by mouth daily.    atorvastatin (LIPITOR) 20 MG tablet Take 20 mg by mouth daily.    Buprenorphine 15 MCG/HR PTWK Place 1 patch onto the skin every 7 (seven) days.    carvedilol (COREG) 25 MG tablet Take 25 mg by mouth 2 (two) times daily with a meal.    ferrous sulfate 325 (65 FE) MG tablet Take 325 mg by mouth daily with breakfast.    HUMALOG 100 UNIT/ML injection Inject into the skin.     Insulin Human (INSULIN PUMP) SOLN Inject into the skin. Humulog Insulin Pump    LINZESS 145 MCG CAPS capsule Take 1 tablet by mouth daily as needed. constipation    pregabalin (LYRICA) 75 MG capsule Take 75 mg by mouth 2 (two) times daily as needed (pain).    promethazine (PHENERGAN) 25 MG tablet Take 25 mg by mouth every 6 (six) hours as needed for nausea or vomiting.      STOP taking these medications     Minocycline HCl 80 MG TB24        Allergies  Allergen Reactions  . Doxycycline Nausea Only    (kidney function?)   Follow-up Information    Follow up with Xu,Jindong, MD In 2 months.   Specialty:  Neurology   Why:  Stroke Clinic, Office will call you with appointment date & time   Contact information:   [address] [place] [address] [phone]        The results of significant diagnostics from this hospitalization (including imaging, microbiology, ancillary and laboratory) are listed below for reference.    Significant Diagnostic Studies: Dg Chest 2 View  01/12/2015   CLINICAL DATA:  54 year old with cerebral vascular accident yesterday  EXAM: CHEST  2 VIEW  COMPARISON:  Prior chest x-ray 11/17/2008  FINDINGS: The lungs are clear and negative for focal airspace consolidation, pulmonary edema or suspicious pulmonary nodule. No pleural effusion or pneumothorax. Cardiac and mediastinal contours are within normal limits. No acute fracture  or lytic or blastic osseous lesions. The visualized upper abdominal bowel gas pattern is unremarkable.  IMPRESSION: Negative chest x-ray.   Electronically Signed   By: Malachy Moan M.D.   On: 01/12/2015 21:42   Ct Head Wo Contrast  01/12/2015   CLINICAL DATA:  Left-sided weakness and unsteady gait began yesterday, some improvement  EXAM: CT HEAD WITHOUT CONTRAST  TECHNIQUE: Contiguous axial images were obtained from the base of the skull through the vertex without intravenous contrast.  COMPARISON:  None.  FINDINGS: The ventricular system is normal in size and configuration and the septum is midline in position. The fourth ventricle and basilar cisterns are unremarkable. No hemorrhage, mass lesion, or acute infarction is seen. There may be very  mild small vessel disease within the periventricular white matter. On bone window images, there is complete opacification of the right maxillary sinus with somewhat thickened walls, most consistent with chronic right maxillary sinusitis. The remainder of the paranasal sinuses are well pneumatized. No calvarial abnormality is seen.  IMPRESSION: 1. No acute intracranial abnormality. Question mild small vessel disease of the periventricular white matter. 2. 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   Electronically Signed   By: Dwyane Dee M.D.   On: 01/12/2015 11:57   Mr Brain Wo Contrast  01/12/2015   CLINICAL DATA:  Left-sided weakness and unsteady gait beginning at 1500 hours yesterday. Symptoms are incompletely resolved.  EXAM: MRI HEAD WITHOUT CONTRAST  TECHNIQUE: Multiplanar, multiecho pulse sequences of the brain and surrounding structures were obtained without intravenous contrast.  COMPARISON:  CT head without contrast 01/12/2015  FINDINGS: The 14 mm acute/ subacute nonhemorrhagic right paramedian pontine infarct is present. T2 hyperintensity is associated with the infarct. More chronic T2 changes are present bilaterally in the lower pons and  bilateral cerebral peduncle is. Mild to moderate age advanced periventricular T2 changes are present as well. There scattered subcortical T2 changes bilaterally.  No acute hemorrhage or mass lesion is present.  The ventricles are of normal size. No significant extraaxial fluid collection is present.  Flow is present in the major intracranial arteries. The globes and orbits are intact. Mild mucosal thickening is present in the anterior ethmoid air cells. The right maxillary sinus is chronically opacified. The left maxillary sinus is clear. Minimal fluid is present in the left mastoid air cells. No obstructing nasopharyngeal lesion is present.  The skullbase is within normal limits. Midline images demonstrate moderate degenerative changes in the upper cervical spine. Intracranial midline structures are normal.  IMPRESSION: 1. 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 2. Age advanced white matter disease. Given the acute infarct in extension in the brainstem, this likely reflects the sequela of chronic microvascular ischemia. 3. Chronic right maxillary sinus opacification. 4. Left mastoid effusion. No obstructing nasopharyngeal lesion is present. These results were called by telephone at the time of interpretation on 01/12/2015 at 4:03 pm to ABIGAIL, ED PA, who verbally acknowledged these results.   Electronically Signed   By: Marin Roberts M.D.   On: 01/12/2015 16:24   Mr Maxine Glenn Head/brain Wo Cm  01/12/2015   CLINICAL DATA:  Initial evaluation for acute stroke.  EXAM: MRA HEAD WITHOUT CONTRAST  TECHNIQUE: Angiographic images of the Circle of Willis were obtained using MRA technique without intravenous contrast.  COMPARISON:  Prior MRI performed earlier on the same day.  FINDINGS: ANTERIOR CIRCULATION:  Visualized portions of the distal cervical segments of the internal carotid arteries are widely patent with antegrade flow. The petrous, cavernous, and supra clinoid segments are widely  patent. Right A1 segment is hypoplastic with T anterior cerebral arteries supplied primarily a via the left internal carotid artery system. Slight asymmetry within the internal carotid arteries limb cells, with the left larger than the right, is also consistent with this finding. Anterior communicating artery is normal. M1 segments widely patent without stenosis or occlusion. Distal MCA branches normal.  POSTERIOR CIRCULATION:  Vertebral arteries are widely patent to the vertebrobasilar junction. The posterior inferior cerebral arteries are patent bilaterally. The left anterior inferior cerebellar artery is dominant. Basilar artery is mildly tortuous but widely patent. No basilar thrombosis or significant stenosis. Superior cerebellar arteries are well opacified. Posterior cerebral arteries widely patent.  No aneurysm or vascular  malformation.  Right maxillary sinus disease noted.  IMPRESSION: 1. No large vessel occlusion or significant atheromatous disease within the intracranial circulation. Both the anterior and posterior circulation are widely patent. 2. Hypoplastic right A1 segment, with the anterior cerebral arteries supplied primarily via the left internal carotid artery system.   Electronically Signed   By: Rise MuBenjamin  McClintock M.D.   On: 01/12/2015 22:06    Microbiology: No results found for this or any previous visit (from the past 240 hour(s)).   Labs: Basic Metabolic Panel:  Recent Labs Lab 01/12/15 1134 01/12/15 2248  NA 137  --   K 5.0  --   CL 106  --   CO2 21*  --   GLUCOSE 206*  --   BUN 52*  --   CREATININE 3.16* 2.93*  CALCIUM 9.5  --    Liver Function Tests: No results for input(s): AST, ALT, ALKPHOS, BILITOT, PROT, ALBUMIN in the last 168 hours. No results for input(s): LIPASE, AMYLASE in the last 168 hours. No results for input(s): AMMONIA in the last 168 hours. CBC:  Recent Labs Lab 01/12/15 1134 01/12/15 2248  WBC 5.8 6.6  HGB 11.6* 10.3*  HCT 35.9* 32.1*   MCV 77.5* 75.5*  PLT 175 163   Cardiac Enzymes: No results for input(s): CKTOTAL, CKMB, CKMBINDEX, TROPONINI in the last 168 hours. BNP: BNP (last 3 results) No results for input(s): BNP in the last 8760 hours.  ProBNP (last 3 results) No results for input(s): PROBNP in the last 8760 hours.  CBG:  Recent Labs Lab 01/12/15 2154 01/13/15 0143 01/13/15 0408 01/13/15 0731 01/13/15 1208  GLUCAP 310* 285* 190* 183* 275*       Signed:  Rhetta MuraSAMTANI, JAI-GURMUKH  [facility] 01/13/2015, 4:12 PM    35 minutes

## 2015-01-13 NOTE — Progress Notes (Signed)
Initial Nutrition Assessment  INTERVENTION:  Ensure Enlive (each supplement provides 350kcal and 20 grams of protein) BID  NUTRITION DIAGNOSIS:  Inadequate oral intake related to altered GI function, nausea as evidenced by per patient/family report, mild depletion of body fat, moderate depletions of muscle mass.  GOAL:  Patient will meet greater than or equal to 90% of their needs   MONITOR:  PO intake, Labs, Weight trends, Skin, I & O's  REASON FOR ASSESSMENT:  Malnutrition Screening Tool    ASSESSMENT: 54 y.o. male with a Past Medical History of diabetes on insulin pump, hypertension, suspected chronic kidney disease, ongoing tobacco use, prior history of cocaine use (claims last use approximately 2-3 years back) who presents today with complaints of left-sided weakness.   Pt states that for the past year he has been on pain medications/patches that have caused constipation and nausea. He states he eats 50% less than he used to and has lost from 160 lbs down to current weight. He has been drinking Ensure and Boost at home due to poor PO intake.  He reports eating 100% of breakfast this morning. Pt has mild fat wasting of thoracic region and moderate muscle wasting of clavicle/acromion region and legs. RD encouraged pt to continue drinking nutritional supplements and encouraged snacking in between meals.   Labs: low hemoglobin, elevated glucose, elevated BUN/creatinine, hemoglobin A1C pending  Height:  Ht Readings from Last 1 Encounters:  01/13/15 5\' 9"  (1.753 m)    Weight:  Wt Readings from Last 1 Encounters:  01/12/15 144 lb (65.318 kg)    Ideal Body Weight:  72.7 kg  Wt Readings from Last 10 Encounters:  01/12/15 144 lb (65.318 kg)  02/12/09 144 lb (65.318 kg)    BMI:  There is no weight on file to calculate BMI.  Estimated Nutritional Needs:  Kcal:  1800-2000  Protein:  70-80 grams  Fluid:  1.8-2 L/day  Skin:  Reviewed, no issues  Diet Order:  Diet  heart healthy/carb modified Room service appropriate?: Yes; Fluid consistency:: Thin  EDUCATION NEEDS:  No education needs identified at this time   Intake/Output Summary (Last 24 hours) at 01/13/15 1051 Last data filed at 01/12/15 1529  Gross per 24 hour  Intake   1000 ml  Output      0 ml  Net   1000 ml    Last BM:  6/5  Ian Malkineanne Barnett RD, LDN Inpatient Clinical Dietitian Pager: [phone] After Hours Pager: [phone]

## 2015-01-13 NOTE — Progress Notes (Signed)
Utilization Review Completed.Daniel Santos T6/03/2015  

## 2015-01-14 LAB — HEMOGLOBIN A1C
HEMOGLOBIN A1C: 8.5 % — AB (ref 4.8–5.6)
MEAN PLASMA GLUCOSE: 197 mg/dL

## 2015-03-25 ENCOUNTER — Encounter: Payer: Self-pay | Admitting: Neurology

## 2015-03-25 ENCOUNTER — Ambulatory Visit (INDEPENDENT_AMBULATORY_CARE_PROVIDER_SITE_OTHER): Payer: BLUE CROSS/BLUE SHIELD | Admitting: Neurology

## 2015-03-25 VITALS — BP 130/88 | HR 72 | Ht 69.0 in | Wt 127.5 lb

## 2015-03-25 DIAGNOSIS — I69398 Other sequelae of cerebral infarction: Secondary | ICD-10-CM | POA: Diagnosis not present

## 2015-03-25 DIAGNOSIS — I69359 Hemiplegia and hemiparesis following cerebral infarction affecting unspecified side: Secondary | ICD-10-CM | POA: Insufficient documentation

## 2015-03-25 HISTORY — DX: Hemiplegia and hemiparesis following cerebral infarction affecting unspecified side: I69.359

## 2015-03-25 HISTORY — DX: Hemiplegia and hemiparesis following cerebral infarction affecting unspecified side: I69.398

## 2015-03-25 NOTE — Progress Notes (Signed)
Reason for visit: Stroke  Referring physician: [facility]  KALIEF KATTNER is a 54 y.o. male  History of present illness:  Mr. Petion is a 54 year old right-handed black male with a history of hypertension, diabetes, tobacco abuse, and dyslipidemia. He also has chronic renal insufficiency. He was on low-dose aspirin, but on 01/12/2015, he was admitted for left-sided symptoms of some mild weakness, gait instability. He denies any double vision, loss of vision, slurred speech, or syncope. 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 He underwent a workup that did not show a cardiogenic source of stroke. He was felt to have small vessel disease, and he was placed on Plavix. The patient has continued to take aspirin, however. He believes that he is back to normal in terms of his physical abilities, he is back to work, but he still smokes a pack of cigarettes daily. His last hemoglobin A1c was 8.5. He returns to this office for an evaluation.  Past Medical History  Diagnosis Date  . Diabetes mellitus without complication   . Hypertension   . Hypercholesteremia   . Renal disorder   . Hemiparesis and alteration of sensations as late effects of stroke 03/25/2015    Past Surgical History  Procedure Laterality Date  . Back surgery      3-lumbar  . Penis rupture      Family History  Problem Relation Age of Onset  . Cancer Mother   . Diabetes Paternal Aunt   . Leukemia Paternal Uncle   . Stroke Paternal Grandmother   . Diabetes Paternal Grandmother     Social history:  reports that he has been smoking.  He has never used smokeless tobacco. He reports that he does not drink alcohol or use illicit drugs.  Medications:  Prior to Admission medications   Medication Sig Start Date End Date Taking? Authorizing Provider  amLODipine (NORVASC) 10 MG tablet Take 10 mg by mouth daily.   Yes Historical Provider, MD  aspirin EC 81 MG tablet  Take 81 mg by mouth daily.   Yes Historical Provider, MD  atorvastatin (LIPITOR) 20 MG tablet Take 20 mg by mouth daily.   Yes Historical Provider, MD  Buprenorphine 15 MCG/HR PTWK Place 1 patch onto the skin every 7 (seven) days.   Yes Historical Provider, MD  calcitRIOL (ROCALTROL) 0.25 MCG capsule Take 0.25 mcg by mouth every other day. Monday, Wednesday, Friday   Yes Historical Provider, MD  carvedilol (COREG) 25 MG tablet Take 25 mg by mouth 2 (two) times daily with a meal.   Yes Historical Provider, MD  clopidogrel (PLAVIX) 75 MG tablet Take 1 tablet (75 mg total) by mouth daily. 01/14/15  Yes Rhetta Mura, MD  ferrous sulfate 325 (65 FE) MG tablet Take 325 mg by mouth daily with breakfast.   Yes Historical Provider, MD  HUMALOG 100 UNIT/ML injection Inject into the skin.  10/19/14  Yes Historical Provider, MD  Insulin Human (INSULIN PUMP) SOLN Inject into the skin. Humulog Insulin Pump   Yes Historical Provider, MD  LINZESS 145 MCG CAPS capsule Take 1 tablet by mouth daily as needed. constipation 10/14/14  Yes Historical Provider, MD  Minocycline HCl (SOLODYN) 80 MG TB24 Take 1 tablet by mouth daily.   Yes Historical Provider, MD  pregabalin (LYRICA) 75 MG capsule Take 75 mg by mouth 2 (two) times daily as needed (pain).   Yes Historical Provider, MD  promethazine (PHENERGAN) 25  MG tablet Take 25 mg by mouth every 6 (six) hours as needed for nausea or vomiting.   Yes Historical Provider, MD      Allergies  Allergen Reactions  . Doxycycline Nausea Only    (kidney function?)    ROS:  Out of a complete 14 system review of symptoms, the patient complains only of the following symptoms, and all other reviewed systems are negative.  Weight loss Heart murmur Constipation Feeling cold Muscle cramps  Blood pressure 130/88, pulse 72, height  (1.753 m), weight 127 lb 8 oz (57.834 kg).  Physical Exam  General: The patient is alert and cooperative at the time of the  examination.  Eyes: Pupils are equal, round, and reactive to light. Discs are flat bilaterally.  Neck: The neck is supple, no carotid bruits are noted.  Respiratory: The respiratory examination is clear.  Cardiovascular: The cardiovascular examination reveals a regular rate and rhythm, no obvious murmurs or rubs are noted.  Skin: Extremities are without significant edema.  Neurologic Exam  Mental status: The patient is alert and oriented x 3 at the time of the examination. The patient has apparent normal recent and remote memory, with an apparently normal attention span and concentration ability.  Cranial nerves: Facial symmetry is present. There is good sensation of the face to pinprick and soft touch bilaterally. The strength of the facial muscles and the muscles to head turning and shoulder shrug are normal bilaterally. Speech is well enunciated, no aphasia or dysarthria is noted. Extraocular movements are full. Visual fields are full. The tongue is midline, and the patient has symmetric elevation of the soft palate. No obvious hearing deficits are noted.  Motor: The motor testing reveals 5 over 5 strength of all 4 extremities. Good symmetric motor tone is noted throughout.  Sensory: Sensory testing is intact to pinprick, soft touch, vibration sensation, and position sense on all 4 extremities, with exception of a stocking pattern pinprick sensory deficit two thirds the way up the legs below the knees. No evidence of extinction is noted.  Coordination: Cerebellar testing reveals good finger-nose-finger and heel-to-shin bilaterally.  Gait and station: Gait is normal. Tandem gait is normal. Romberg is negative. No drift is seen.  Reflexes: Deep tendon reflexes are symmetric, but are depressed bilaterally. Toes are downgoing bilaterally.   Ct Head Wo Contrast 01/12/15:  01/12/2015 1. No acute intracranial abnormality. Question mild small vessel disease of the periventricular white  matter. 2. 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   Mr Brain Wo Contrast  01/12/2015 1. 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 2. Age advanced white matter disease. Given the acute infarct in extension in the brainstem, this likely reflects the sequela of chronic microvascular ischemia. 3. Chronic right maxillary sinus opacification. 4. Left mastoid effusion. No obstructing nasopharyngeal lesion is present.   * MRI scan images were reviewed online. I agree with the written report.   Mr Maxine Glenn Head/brain Wo Cm  01/12/2015 1. No large vessel occlusion or significant atheromatous disease within the intracranial circulation. Both the anterior and posterior circulation are widely patent. 2. Hypoplastic right A1 segment, with the anterior cerebral arteries supplied primarily via the left internal carotid artery system.   * MRA scan images were reviewed online. I agree with the written report.   Carotid Doppler There is 1-39% bilateral ICA stenosis. Vertebral artery flow is antegrade.   2D Echocardiogram  - Left ventricle: The cavity size was normal. Wall thickness wasincreased in a pattern  of mild LVH. The estimated ejectionfraction was 65%. Wall motion was normal; there were no regionalwall motion abnormalities. - Right ventricle: The cavity size was normal. Systolic functionwas normal. - Pulmonary arteries: PA peak pressure: 40 mm Hg (S). Impressions: No cardiac source of embolism was identified, butcannot be ruled out on the basis of this examination.   Assessment/Plan:  1. Cerebrovascular disease, right paramedian pontine stroke  2. Tobacco abuse  3. Diabetes  4. Hypertension  5. Dyslipidemia  6. Chronic renal insufficiency  The patient has multiple risk factors for stroke. He is still smoking, I have indicated that he should stop using tobacco. The patient is on aspirin and Plavix, he should stop the aspirin at this  time, continue the Plavix. He is to keep his systolic blood pressures under 130 if at all possible. He is doing quite well fortunately. He will follow-up through this office on an as-needed basis. I have asked him to follow-up regularly with his primary care physician.  Marlan Palau MD 03/25/2015 8:34 PM  [facility] [address] [place], [place] [address]  Phone [phone] Fax [phone]

## 2015-03-25 NOTE — Patient Instructions (Addendum)
We will have you stop aspirin, stay on Plavix. If you can stop smoking, this would greatly benefit your health. We will see back if needed. Try to keep your blood pressure under 130 systolic (upper number).   Stroke Prevention Some medical conditions and behaviors are associated with an increased chance of having a stroke. You may prevent a stroke by making healthy choices and managing medical conditions. HOW CAN I REDUCE MY RISK OF HAVING A STROKE?   Stay physically active. Get at least 30 minutes of activity on most or all days.  Do not smoke. It may also be helpful to avoid exposure to secondhand smoke.  Limit alcohol use. Moderate alcohol use is considered to be:  No more than 2 drinks per day for men.  No more than 1 drink per day for nonpregnant women.  Eat healthy foods. This involves:  Eating 5 or more servings of fruits and vegetables a day.  Making dietary changes that address high blood pressure (hypertension), high cholesterol, diabetes, or obesity.  Manage your cholesterol levels.  Making food choices that are high in fiber and low in saturated fat, trans fat, and cholesterol may control cholesterol levels.  Take any prescribed medicines to control cholesterol as directed by your health care provider.  Manage your diabetes.  Controlling your carbohydrate and sugar intake is recommended to manage diabetes.  Take any prescribed medicines to control diabetes as directed by your health care provider.  Control your hypertension.  Making food choices that are low in salt (sodium), saturated fat, trans fat, and cholesterol is recommended to manage hypertension.  Take any prescribed medicines to control hypertension as directed by your health care provider.  Maintain a healthy weight.  Reducing calorie intake and making food choices that are low in sodium, saturated fat, trans fat, and cholesterol are recommended to manage weight.  Stop drug abuse.  Avoid  taking birth control pills.  Talk to your health care provider about the risks of taking birth control pills if you are over 71 years old, smoke, get migraines, or have ever had a blood clot.  Get evaluated for sleep disorders (sleep apnea).  Talk to your health care provider about getting a sleep evaluation if you snore a lot or have excessive sleepiness.  Take medicines only as directed by your health care provider.  For some people, aspirin or blood thinners (anticoagulants) are helpful in reducing the risk of forming abnormal blood clots that can lead to stroke. If you have the irregular heart rhythm of atrial fibrillation, you should be on a blood thinner unless there is a good reason you cannot take them.  Understand all your medicine instructions.  Make sure that other conditions (such as anemia or atherosclerosis) are addressed. SEEK IMMEDIATE MEDICAL CARE IF:   You have sudden weakness or numbness of the face, arm, or leg, especially on one side of the body.  Your face or eyelid droops to one side.  You have sudden confusion.  You have trouble speaking (aphasia) or understanding.  You have sudden trouble seeing in one or both eyes.  You have sudden trouble walking.  You have dizziness.  You have a loss of balance or coordination.  You have a sudden, severe headache with no known cause.  You have new chest pain or an irregular heartbeat. Any of these symptoms may represent a serious problem that is an emergency. Do not wait to see if the symptoms will go away. Get medical help at  once. Call your local emergency services (911 in [place] Do not drive yourself to the hospital. Document Released: 08/31/2004 Document Revised: 12/08/2013 Document Reviewed: 01/24/2013 [facility] Patient Information 2015 [place], [place]. This information is not intended to replace advice given to you by your health care provider. Make sure you discuss any questions you have with your health care  provider.

## 2015-07-08 ENCOUNTER — Encounter: Payer: Self-pay | Admitting: Internal Medicine

## 2015-11-19 ENCOUNTER — Encounter: Payer: Self-pay | Admitting: *Deleted

## 2015-11-19 ENCOUNTER — Ambulatory Visit: Payer: BLUE CROSS/BLUE SHIELD | Admitting: Cardiovascular Disease

## 2015-11-20 NOTE — Progress Notes (Signed)
HPI: 55 yo male for evaluation of . Renal dopplers 6/13 showed no RAS. Echo 6/16 showed normal LV function. Carotid dopplers 6/16 showed 1-39 bilateral stenosis.   Current Outpatient Prescriptions  Medication Sig Dispense Refill  . amLODipine (NORVASC) 10 MG tablet Take 10 mg by mouth daily.    Marland Kitchen. aspirin EC 81 MG tablet Take 81 mg by mouth daily.    Marland Kitchen. atorvastatin (LIPITOR) 20 MG tablet Take 20 mg by mouth daily.    . Buprenorphine 15 MCG/HR PTWK Place 1 patch onto the skin every 7 (seven) days.    . calcitRIOL (ROCALTROL) 0.25 MCG capsule Take 0.25 mcg by mouth every other day. Monday, Wednesday, Friday    . carvedilol (COREG) 25 MG tablet Take 25 mg by mouth 2 (two) times daily with a meal.    . clopidogrel (PLAVIX) 75 MG tablet Take 1 tablet (75 mg total) by mouth daily. 30 tablet 0  . ferrous sulfate 325 (65 FE) MG tablet Take 325 mg by mouth daily with breakfast.    . HUMALOG 100 UNIT/ML injection Inject into the skin.     . Insulin Human (INSULIN PUMP) SOLN Inject into the skin. Humulog Insulin Pump    . LINZESS 145 MCG CAPS capsule Take 1 tablet by mouth daily as needed. constipation    . Minocycline HCl (SOLODYN) 80 MG TB24 Take 1 tablet by mouth daily.    . pregabalin (LYRICA) 75 MG capsule Take 75 mg by mouth 2 (two) times daily as needed (pain).    . promethazine (PHENERGAN) 25 MG tablet Take 25 mg by mouth every 6 (six) hours as needed for nausea or vomiting.     No current facility-administered medications for this visit.    Allergies  Allergen Reactions  . Doxycycline Nausea Only    (kidney function?)    Past Medical History  Diagnosis Date  . Diabetes mellitus without complication (HCC)   . Hypertension   . Hypercholesteremia   . Renal disorder   . 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 History of non-insulin dependent diabetes mellitus     Past Surgical History  Procedure Laterality Date  . Lumbar spine  surgery  2005, 2008, 2010  . Repair of fractured penis  05/2000    with cystoscopy    Social History   Social History  . Marital Status: Single    Spouse Name: N/A  . Number of Children: 3  . Years of Education: 12   Occupational History  . Not on file.   Social History Main Topics  . Smoking status: Current Every Day Smoker -- 1.00 packs/day  . Smokeless tobacco: Never Used  . Alcohol Use: No  . Drug Use: No  . Sexual Activity: Not on file   Other Topics Concern  . Not on file   Social History Narrative   Patient drinks about 2 cups of caffeine daily.   Patient is right handed.    Family History  Problem Relation Age of Onset  . Cancer Mother   . Diabetes Paternal Aunt     x2  . Leukemia Paternal Uncle   . Stroke Paternal Grandmother   . Diabetes Paternal Grandmother   . Breast cancer Maternal Grandmother   . Breast cancer Paternal Grandmother   . Kidney disease Father     ROS: no fevers or chills, productive cough, hemoptysis, dysphasia, odynophagia, melena, hematochezia, dysuria, hematuria, rash, seizure activity, orthopnea, PND,  pedal edema, claudication. Remaining systems are negative.  Physical Exam:   There were no vitals taken for this visit.  General:  Well developed/well nourished in NAD Skin warm/dry Patient not depressed No peripheral clubbing Back-normal HEENT-normal/normal eyelids Neck supple/normal carotid upstroke bilaterally; no bruits; no JVD; no thyromegaly chest - CTA/ normal expansion CV - RRR/normal S1 and S2; no murmurs, rubs or gallops;  PMI nondisplaced Abdomen -NT/ND, no HSM, no mass, + bowel sounds, no bruit 2+ femoral pulses, no bruits Ext-no edema, chords, 2+ DP Neuro-grossly nonfocal  ECG    This encounter was created in error - please disregard.

## 2015-11-22 ENCOUNTER — Encounter: Payer: BLUE CROSS/BLUE SHIELD | Admitting: Cardiology

## 2015-12-14 ENCOUNTER — Ambulatory Visit (INDEPENDENT_AMBULATORY_CARE_PROVIDER_SITE_OTHER): Payer: BLUE CROSS/BLUE SHIELD | Admitting: Cardiovascular Disease

## 2015-12-14 ENCOUNTER — Encounter: Payer: Self-pay | Admitting: Cardiovascular Disease

## 2015-12-14 VITALS — BP 120/80 | HR 60 | Ht 69.0 in | Wt 128.0 lb

## 2015-12-14 DIAGNOSIS — Z720 Tobacco use: Secondary | ICD-10-CM | POA: Diagnosis not present

## 2015-12-14 DIAGNOSIS — R0789 Other chest pain: Secondary | ICD-10-CM | POA: Diagnosis not present

## 2015-12-14 DIAGNOSIS — R002 Palpitations: Secondary | ICD-10-CM | POA: Diagnosis not present

## 2015-12-14 DIAGNOSIS — R079 Chest pain, unspecified: Secondary | ICD-10-CM

## 2015-12-14 HISTORY — DX: Other chest pain: R07.89

## 2015-12-14 HISTORY — DX: Palpitations: R00.2

## 2015-12-14 MED ORDER — BUPROPION HCL ER (SR) 150 MG PO TB12: ORAL_TABLET | ORAL | Status: DC

## 2015-12-14 NOTE — Patient Instructions (Addendum)
Medication Instructions:  START WELLBUTRIN 150 MG ONCE DAILY FOR 3 DAYS AND THEN INCREASE TO TWICE A DAY FOR 12 WEEKS  Labwork: [facility]bc[facility]bmet[facility]magnesium/tsh[facility] on [facility]  Testing/Procedures: Your physician has requested that you have a lexiscan myoview. For further information please visit [email or web address] Please follow instruction sheet, as given.  Follow-Up: Your physician wants you to follow-up in: 6 month ov You will receive a reminder letter in the mail two months in advance. If you don't receive a letter, please call our office to schedule the follow-up appointment.  If you need a refill on your cardiac medications before your next appointment, please call your pharmacy.

## 2015-12-14 NOTE — Progress Notes (Signed)
Cardiology Office Note   Date:  12/14/2015   ID:  Daniel Santos, DOB 12-23-1960, MRN [id number]  PCP:  Gwynneth Aliment, MD  Cardiologist:   Chilton Si, MD  Nephrologist: Dr. Arrie Aran  Chief Complaint  Patient presents with  . New Evaluation    Referred Dr. Gala [facility] for Palpitations  pt states when he takes his bp--screen says irregular heartbeat; c/o chest spasm--not sure what it is and has had for all of his life; swelling in legs/feet/ankles      History of Present Illness: Daniel Santos is a 55 y.o. male with diabetes, hypertension, hyperlipidemia, spinal stenosis, prior polysubstance abuse and prior stroke who presents for an evaluation of palpitations.  He mentioned these to his nephrologist, Dr. Arrie Aran, who referred him to cardiology for evaluation. Daniel Santos notes palpitations that typically occur when he is lying in bed at night.  This started 3-4 months ago and lasts for a few seconds.  There is no associated shortness of breath but he sometimes has to take a deep breath.  He denies chest pain, lightheadedness or dizziness.  He doesn't notice it at other times of the day or with exertion.  It happens approximately 3 times per week.  At times when he checks his BP the monitor indicates that his heart beat is irregular.  He drinks up to 3 cups of coffee at work.  He doesn't use OTC cough or cold medicines or use illicits.  Daniel Santos occasionally gets sharp pain in his chest that has been ongoing intermittently for years.  This occurs randomly and is not associated with shortness of breath, nausea or diaphoresis.  It does not occur with exertion, though he does not get much exercise.  He is limited by back pain.  He denies lower extremity edema, orhtopnea or PND.    Daniel Santos smokes 1 ppd with approximately 38 pack years. He is interested in quitting.  He was prescribed Chantix but is unable to afford it.   Past Medical History  Diagnosis Date  .  Diabetes mellitus without complication (HCC)   . Hypertension   . Hypercholesteremia   . Renal disorder   . 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 History of non-insulin dependent diabetes mellitus   . Palpitations 12/14/2015  . Atypical chest pain 12/14/2015    Past Surgical History  Procedure Laterality Date  . Lumbar spine surgery  2005, 2008, 2010  . Repair of fractured penis  05/2000    with cystoscopy     Current Outpatient Prescriptions  Medication Sig Dispense Refill  . amLODipine (NORVASC) 10 MG tablet Take 10 mg by mouth daily.    Marland Kitchen aspirin EC 81 MG tablet Take 81 mg by mouth daily.    Marland Kitchen atorvastatin (LIPITOR) 20 MG tablet Take 20 mg by mouth daily.    Marland Kitchen BUTRANS 20 MCG/HR PTWK patch Place 1 patch onto the skin once a week.    . calcitRIOL (ROCALTROL) 0.25 MCG capsule Take 0.25 mcg by mouth every other day. Monday, Wednesday, Friday    . carvedilol (COREG) 25 MG tablet Take 25 mg by mouth 2 (two) times daily with a meal.    . clopidogrel (PLAVIX) 75 MG tablet Take 1 tablet (75 mg total) by mouth daily. 30 tablet 0  . ferrous sulfate 325 (65 FE) MG tablet Take 325 mg by mouth daily with breakfast.    . HUMALOG 100 UNIT/ML injection Inject  into the skin.     . Insulin Human (INSULIN PUMP) SOLN Inject into the skin. Humulog Insulin Pump    . LINZESS 145 MCG CAPS capsule Take 1 tablet by mouth daily as needed. constipation    . pregabalin (LYRICA) 75 MG capsule Take 75 mg by mouth 2 (two) times daily as needed (pain).    . promethazine (PHENERGAN) 25 MG tablet Take 25 mg by mouth every 6 (six) hours as needed for nausea or vomiting.    Marland Kitchen buPROPion (WELLBUTRIN SR) 150 MG 12 hr tablet 1 tablet by mouth daily for 3 days and then increase to twice a day for 12 weeks 183 tablet 0   No current facility-administered medications for this visit.    Allergies:   Doxycycline    Social History:  The patient  reports that he has been  smoking.  He has never used smokeless tobacco. He reports that he does not drink alcohol or use illicit drugs.   Family History:  The patient's family history includes Breast cancer in his maternal grandmother and paternal grandmother; Cancer in his mother; Diabetes in his paternal aunt and paternal grandmother; Kidney disease in his father; Leukemia in his paternal uncle; Stroke in his paternal grandmother.    ROS:  Please see the history of present illness.   Otherwise, review of systems are positive for none.   All other systems are reviewed and negative.    PHYSICAL EXAM: VS:  BP 120/80 mmHg  Pulse 60  Ht 5\' 9"  (1.753 m)  Wt 58.06 kg (128 lb)  BMI 18.89 kg/m2 , BMI Body mass index is 18.89 kg/(m^2). GENERAL:  Well appearing HEENT:  Pupils equal round and reactive, fundi not visualized, oral mucosa unremarkable NECK:  No jugular venous distention, waveform within normal limits, carotid upstroke brisk and symmetric, no bruits, no thyromegaly LYMPHATICS:  No cervical adenopathy LUNGS:  Clear to auscultation bilaterally HEART:  RRR.  PMI not displaced or sustained,S1 and S2 within normal limits, no S3, no S4, no clicks, no rubs, no murmurs ABD:  Flat, positive bowel sounds normal in frequency in pitch, no bruits, no rebound, no guarding, no midline pulsatile mass, no hepatomegaly, no splenomegaly EXT:  2 plus pulses throughout, no edema, no cyanosis no clubbing SKIN:  No rashes no nodules NEURO:  Cranial nerves II through XII grossly intact, motor grossly intact throughout PSYCH:  Cognitively intact, oriented to person place and time   EKG:  EKG is ordered today. The ekg ordered today demonstrates sinus rhythm rate 60 bpm.  R atrial enlargement.    Recent Labs: 01/12/2015: BUN 52*; Creatinine, Ser 2.93*; Hemoglobin 10.3*; Platelets 163; Potassium 5.0; Sodium 137    Lipid Panel    Component Value Date/Time   CHOL 95 01/13/2015 0425   TRIG 88 01/13/2015 0425   HDL 38* 01/13/2015  0425   CHOLHDL 2.5 01/13/2015 0425   VLDL 18 01/13/2015 0425   LDLCALC 39 01/13/2015 0425      Wt Readings from Last 3 Encounters:  12/14/15 58.06 kg (128 lb)  03/25/15 57.834 kg (127 lb 8 oz)  01/12/15 65.318 kg (144 lb)      ASSESSMENT AND PLAN:  # Palpitations: Daniel Santos has palpitations that last for a few seconds at a time.  This is likely PACs or PVCs.  He is not particularly bothered by them.  We will check a CBC, BMP, magnesium, TSH and free T4.  Continue carvedilol.     # Atypical chest pain:  Given his history of diabetes, hypertension and tobacco abuse, we will refer him for [facility].  He does not think that he will be able to walk on the treadmill due to back pain.   # Tobacco abuse: Daniel Santos is interested in tobacco cessation.  We will try Wellbutrin 150 mg daily x3 days then 150 mg bid x12 weeks.  I suggested that he wait 1 weeks after starting the medicine before stopping.  We also discussed finding a new habit such as walking or a healthy snack.   We discussed smoking cessation for 7 minutes.   # Hypertension: BP is well-controlled.  Continue amlodipine and carvedilol.  # Hyperlipidemia: LDL is well-controlled.  Continue atorvastatin.  Current medicines are reviewed at length with the patient today.  The patient does not have concerns regarding medicines.  The following changes have been made:  Start Wellbutrin  Labs/ tests ordered today include:   Orders Placed This Encounter  Procedures  . T4, free  . TSH  . CBC with Differential/Platelet  . Basic metabolic panel  . Magnesium  . Myocardial Perfusion Imaging  . EKG 12-Lead     Disposition:   FU with Daniel Thurner C. Duke Salviaandolph, MD, [facility] in 6 months    This note was written with the assistance of speech recognition software.  Please excuse any transcriptional errors.  Signed, Darlis Wragg C. Duke Salviaandolph, MD, [facility]  12/14/2015 12:31 PM    [facility]

## 2015-12-21 LAB — CBC WITH DIFFERENTIAL/PLATELET
BASOS PCT: 1 %
Basophils Absolute: 81 cells/uL (ref 0–200)
Eosinophils Absolute: 243 cells/uL (ref 15–500)
Eosinophils Relative: 3 %
HCT: 38.1 % — ABNORMAL LOW (ref 38.5–50.0)
Hemoglobin: 11 g/dL — ABNORMAL LOW (ref 13.2–17.1)
LYMPHS ABS: 2511 {cells}/uL (ref 850–3900)
LYMPHS PCT: 31 %
MCH: 24.4 pg — ABNORMAL LOW (ref 27.0–33.0)
MCHC: 28.9 g/dL — AB (ref 32.0–36.0)
MCV: 84.5 fL (ref 80.0–100.0)
MPV: 9.9 fL (ref 7.5–12.5)
Monocytes Absolute: 486 cells/uL (ref 200–950)
Monocytes Relative: 6 %
Neutro Abs: 4779 cells/uL (ref 1500–7800)
Neutrophils Relative %: 59 %
Platelets: 196 10*3/uL (ref 140–400)
RBC: 4.51 MIL/uL (ref 4.20–5.80)
RDW: 15 % (ref 11.0–15.0)
WBC: 8.1 10*3/uL (ref 3.8–10.8)

## 2015-12-21 LAB — BASIC METABOLIC PANEL WITH GFR
BUN: 37 mg/dL — ABNORMAL HIGH (ref 7–25)
CO2: 21 mmol/L (ref 20–31)
Calcium: 8.7 mg/dL (ref 8.6–10.3)
Chloride: 106 mmol/L (ref 98–110)
Creat: 2.9 mg/dL — ABNORMAL HIGH (ref 0.70–1.33)
Glucose, Bld: 368 mg/dL — ABNORMAL HIGH (ref 65–99)
Potassium: 5.1 mmol/L (ref 3.5–5.3)
Sodium: 135 mmol/L (ref 135–146)

## 2015-12-21 LAB — T4, FREE: Free T4: 0.9 ng/dL (ref 0.8–1.8)

## 2015-12-21 LAB — TSH: TSH: 3 mIU/L (ref 0.40–4.50)

## 2015-12-21 LAB — MAGNESIUM: Magnesium: 1.7 mg/dL (ref 1.5–2.5)

## 2015-12-30 ENCOUNTER — Telehealth (HOSPITAL_COMMUNITY): Payer: Self-pay

## 2015-12-30 NOTE — Telephone Encounter (Signed)
Encounter complete. 

## 2016-01-04 ENCOUNTER — Ambulatory Visit (HOSPITAL_COMMUNITY)
Admission: RE | Admit: 2016-01-04 | Discharge: 2016-01-04 | Disposition: A | Discharge status: Home / Self Care | Payer: BLUE CROSS/BLUE SHIELD | Source: Ambulatory Visit | Attending: Cardiology | Admitting: Cardiology

## 2016-01-04 DIAGNOSIS — R079 Chest pain, unspecified: Secondary | ICD-10-CM

## 2016-01-05 ENCOUNTER — Telehealth (HOSPITAL_COMMUNITY): Payer: Self-pay

## 2016-01-05 NOTE — Telephone Encounter (Signed)
Encounter complete. 

## 2016-01-06 ENCOUNTER — Ambulatory Visit (HOSPITAL_COMMUNITY)
Admission: RE | Admit: 2016-01-06 | Discharge: 2016-01-06 | Disposition: A | Discharge status: Home / Self Care | Payer: BLUE CROSS/BLUE SHIELD | Source: Ambulatory Visit | Attending: Cardiology | Admitting: Cardiology

## 2016-01-06 DIAGNOSIS — I10 Essential (primary) hypertension: Secondary | ICD-10-CM | POA: Insufficient documentation

## 2016-01-06 DIAGNOSIS — R079 Chest pain, unspecified: Secondary | ICD-10-CM | POA: Diagnosis not present

## 2016-01-06 DIAGNOSIS — R002 Palpitations: Secondary | ICD-10-CM | POA: Diagnosis not present

## 2016-01-06 DIAGNOSIS — Z720 Tobacco use: Secondary | ICD-10-CM | POA: Diagnosis not present

## 2016-01-06 DIAGNOSIS — E109 Type 1 diabetes mellitus without complications: Secondary | ICD-10-CM | POA: Diagnosis not present

## 2016-01-06 DIAGNOSIS — R5383 Other fatigue: Secondary | ICD-10-CM | POA: Insufficient documentation

## 2016-01-06 LAB — MYOCARDIAL PERFUSION IMAGING
CHL CUP NUCLEAR SSS: 3
CSEPPHR: 89 {beats}/min
LVDIAVOL: 87 mL (ref 62–150)
LVSYSVOL: 43 mL
NUC STRESS TID: 1.18
Rest HR: 68 {beats}/min
SDS: 1
SRS: 2

## 2016-01-06 MED ORDER — TECHNETIUM TC 99M TETROFOSMIN IV KIT
30.2000 | PACK | Freq: Once | INTRAVENOUS | Status: AC | PRN
  Administered 2016-01-06: 30.2 via INTRAVENOUS
  Filled 2016-01-06: qty 30

## 2016-01-06 MED ORDER — TECHNETIUM TC 99M TETROFOSMIN IV KIT
10.2000 | PACK | Freq: Once | INTRAVENOUS | Status: AC | PRN
  Administered 2016-01-06: 10 via INTRAVENOUS
  Filled 2016-01-06: qty 10

## 2016-01-06 MED ORDER — AMINOPHYLLINE 25 MG/ML IV SOLN
75.0000 mg | Freq: Once | INTRAVENOUS | Status: AC
  Administered 2016-01-06: 75 mg via INTRAVENOUS

## 2016-01-06 MED ORDER — REGADENOSON 0.4 MG/5ML IV SOLN
0.4000 mg | Freq: Once | INTRAVENOUS | Status: AC
  Administered 2016-01-06: 0.4 mg via INTRAVENOUS

## 2016-04-04 ENCOUNTER — Other Ambulatory Visit: Payer: Self-pay | Admitting: *Deleted

## 2016-04-04 MED ORDER — BUPROPION HCL ER (SR) 150 MG PO TB12: ORAL_TABLET | ORAL | 0 refills | Status: DC

## 2016-04-04 NOTE — Telephone Encounter (Signed)
Received refill request for patients Bupropion SR 150 mg  Ok per Dr Duke Salviaandolph, [facility]

## 2016-07-03 ENCOUNTER — Other Ambulatory Visit: Payer: Self-pay | Admitting: Cardiovascular Disease

## 2016-07-03 NOTE — Telephone Encounter (Signed)
REFILL 

## 2016-07-03 NOTE — Telephone Encounter (Signed)
Please review for refill. Thanks!  

## 2016-10-01 ENCOUNTER — Other Ambulatory Visit: Payer: Self-pay | Admitting: Cardiovascular Disease

## 2016-10-02 NOTE — Telephone Encounter (Signed)
Please review for refill. Thanks!  

## 2016-12-30 ENCOUNTER — Other Ambulatory Visit: Payer: Self-pay | Admitting: Cardiovascular Disease

## 2017-01-02 NOTE — Telephone Encounter (Signed)
Refill Request.  

## 2017-02-23 ENCOUNTER — Encounter: Payer: Self-pay | Admitting: Cardiovascular Disease

## 2017-02-23 ENCOUNTER — Ambulatory Visit (INDEPENDENT_AMBULATORY_CARE_PROVIDER_SITE_OTHER): Payer: BLUE CROSS/BLUE SHIELD | Admitting: Cardiovascular Disease

## 2017-02-23 VITALS — BP 148/92 | Ht 69.0 in | Wt 131.2 lb

## 2017-02-23 DIAGNOSIS — R0789 Other chest pain: Secondary | ICD-10-CM | POA: Diagnosis not present

## 2017-02-23 DIAGNOSIS — Z720 Tobacco use: Secondary | ICD-10-CM | POA: Diagnosis not present

## 2017-02-23 DIAGNOSIS — I10 Essential (primary) hypertension: Secondary | ICD-10-CM

## 2017-02-23 DIAGNOSIS — E7800 Pure hypercholesterolemia, unspecified: Secondary | ICD-10-CM

## 2017-02-23 MED ORDER — NEBIVOLOL HCL 20 MG PO TABS: 40.0000 mg | ORAL_TABLET | Freq: Every day | ORAL | 3 refills | Status: DC

## 2017-02-23 MED ORDER — PROMETHAZINE HCL 25 MG PO TABS: 25.0000 mg | ORAL_TABLET | Freq: Four times a day (QID) | ORAL | 0 refills | Status: DC | PRN

## 2017-02-23 NOTE — Addendum Note (Signed)
Addended by: Regis BillPRATT, Lakedra [place] B on: 02/23/2017 05:28 PM   Modules accepted: Orders

## 2017-02-23 NOTE — Progress Notes (Signed)
Cardiology Office Note   Date:  02/23/2017   ID:  Daniel LawrenceJohn A Durley, DOB 06/25/1961, MRN [id number]  PCP:  Truman HaywardStarkes, Takia S, FNP  Cardiologist:   Chilton Siiffany [place], MD  Nephrologist: Dr. Arrie Aranoladonato  Chief Complaint  Patient presents with  . Follow-up      History of Present Illness: Daniel Santos is a 56 y.o. male with diabetes, hypertension, hyperlipidemia, spinal stenosis, prior polysubstance abuse and prior stroke who presents for follow up.  He was initially seen 12/2015 for an evaluation of palpitations.  His symptoms were mild and he elected not to undergo any ambulatory monitoring. At that time he did report some atypical chest pain and [facility] that revealed LVEF 51% and no ischemia. Started on Wellbutrin for smoking cessation. However he did not try the medication and has over a year's supply at home. He continues to smoke one pack of cigarettes daily.  Daniel Santos reports that he has not been taking his blood pressure medication regularly.  He thinks that the blood pressure medicines are causing him to be nauseous so he does not take them. He hasn't been taking atorvastatin either. He frequently only takes carvedilol once per day if he takes it at all. He also endorses fatigue. He thinks this may be due to his medications, though it also could be   Daniel Santos denies  any recent episodes of chest pain or shortness of breath. Recent had some lower extremity edema and his PCP started Lasix as needed. However, he has not needed to take any recently. He denies orthopnea or PND. He has not been getting much exercise other than when he gets a work.  Past Medical History:  Diagnosis Date  . Atypical chest pain 12/14/2015  . Diabetes mellitus without complication (HCC)   . 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 History of non-insulin dependent diabetes mellitus   . Hypercholesteremia   . Hypertension   .  Palpitations 12/14/2015  . Renal disorder     Past Surgical History:  Procedure Laterality Date  . LUMBAR SPINE SURGERY  2005, 2008, 2010  . REPAIR OF FRACTURED PENIS  05/2000   with cystoscopy     Current Outpatient Prescriptions  Medication Sig Dispense Refill  . amLODipine (NORVASC) 10 MG tablet Take 10 mg by mouth daily.    Marland Kitchen. aspirin EC 81 MG tablet Take 81 mg by mouth daily.    Marland Kitchen. atorvastatin (LIPITOR) 20 MG tablet Take 20 mg by mouth daily.    Marland Kitchen. buPROPion (WELLBUTRIN SR) 150 MG 12 hr tablet TAKE 1 TABLET TWICE A DAY 183 tablet 0  . BUTRANS 20 MCG/HR PTWK patch Place 1 patch onto the skin once a week.    . calcitRIOL (ROCALTROL) 0.25 MCG capsule Take 0.25 mcg by mouth every other day. Monday, Wednesday, Friday    . clopidogrel (PLAVIX) 75 MG tablet Take 1 tablet (75 mg total) by mouth daily. 30 tablet 0  . ferrous sulfate 325 (65 FE) MG tablet Take 325 mg by mouth daily with breakfast.    . HUMALOG 100 UNIT/ML injection Inject into the skin.     . Insulin Human (INSULIN PUMP) SOLN Inject into the skin. Humulog Insulin Pump    . LINZESS 145 MCG CAPS capsule Take 1 tablet by mouth daily as needed. constipation    . pregabalin (LYRICA) 75 MG capsule Take 75 mg by mouth 2 (two) times daily  as needed (pain).    . promethazine (PHENERGAN) 25 MG tablet Take 1 tablet (25 mg total) by mouth every 6 (six) hours as needed for nausea or vomiting. 30 tablet 0  . Nebivolol HCl (BYSTOLIC) 20 MG TABS Take 2 tablets (40 mg total) by mouth daily. 60 tablet 3   No current facility-administered medications for this visit.     Allergies:   Doxycycline    Social History:  The patient  reports that he has been smoking.  He has been smoking about 1.00 pack per day. He has never used smokeless tobacco. He reports that he does not drink alcohol or use drugs.   Family History:  The patient's family history includes Breast cancer in his maternal grandmother and paternal grandmother; Cancer in his mother;  Diabetes in his paternal aunt and paternal grandmother; Kidney disease in his father; Leukemia in his paternal uncle; Stroke in his paternal grandmother.    ROS:  Please see the history of present illness.   Otherwise, review of systems are positive for none.   All other systems are reviewed and negative.    PHYSICAL EXAM: VS:  BP (!) 148/92   Ht 5\' 9"  (1.753 m)   Wt 59.5 kg (131 lb 3.2 oz)   BMI 19.37 kg/m  , BMI Body mass index is 19.37 kg/m. GENERAL:  Well appearing.  No acute distress HEENT:  Pupils equal round and reactive, fundi not visualized, oral mucosa unremarkable NECK:  No jugular venous distention, waveform within normal limits, carotid upstroke brisk and symmetric, no bruits, no thyromegaly LUNGS:  Clear to auscultation bilaterally HEART:  RRR.  PMI not displaced or sustained,S1 and S2 within normal limits, no S3, no S4, no clicks, no rubs, no murmurs ABD:  Flat, positive bowel sounds normal in frequency in pitch, no bruits, no rebound, no guarding, no midline pulsatile mass, no hepatomegaly, no splenomegaly EXT:  2 plus pulses throughout, no edema, no cyanosis no clubbing SKIN:  No rashes no nodules NEURO:  Cranial nerves II through XII grossly intact, motor grossly intact throughout PSYCH:  Cognitively intact, oriented to person place and time   EKG:  EKG is ordered today. The ekg ordered 12/13/15 demonstrates sinus rhythm rate 60 bpm.  R atrial enlargement.  02/23/17: Sinus rhythm.  Rate 72 bpm.  Biatrial enlargement   Lexiscan Myoview 01/06/16:  The left ventricular ejection fraction is mildly decreased (45-54%).  Nuclear stress EF: 51%.  There was no ST segment deviation noted during stress.  The study is normal.  This is a low risk study.    Recent Labs: No results found for requested labs within last 8760 hours.   10/17/16: Sodium 144 potassium 4.8, BUN 23, creatinine 2.8 AST 24, ALT 27 Total cholesterol 149, triglycerides 48, HDL 71, LDL 60 Lipid  Panel    Component Value Date/Time   CHOL 95 01/13/2015 0425   TRIG 88 01/13/2015 0425   HDL 38 (L) 01/13/2015 0425   CHOLHDL 2.5 01/13/2015 0425   VLDL 18 01/13/2015 0425   LDLCALC 39 01/13/2015 0425      Wt Readings from Last 3 Encounters:  02/23/17 59.5 kg (131 lb 3.2 oz)  01/06/16 58.1 kg (128 lb)  12/14/15 58.1 kg (128 lb)      ASSESSMENT AND PLAN:    # Atypical chest pain: Resolved.  Lexiscan Myoview was negative for ischemia 01/2016.  # Tobacco abuse: Mr. Ozburn was encouraged to quit smoking.  Wellbutrin has not been helpful.    #  Hypertension: BP is not controlled.  He has a hard time taking twice daily medications and reports fatigue. We will switch her carvedilol to nebivolol. We discussed the importance of taking his medications as prescribed. He has experienced nausea that preceded his medications. Refilled his Phenergan which he has not had since 2016. Continue amlodipine and carvedilol.  # Hyperlipidemia: LDL 68 on 10/2016. Continue atorvastatin.  # Prior stroke:  Continue aspirin and clopidogrel.  Current medicines are reviewed at length with the patient today.  The patient does not have concerns regarding medicines.  The following changes have been made:  Start Wellbutrin  Labs/ tests ordered today include:   No orders of the defined types were placed in this encounter.    Disposition:   FU with Abryana Lykens C. Duke Salvia, MD, [facility] in 6 months    This note was written with the assistance of speech recognition software.  Please excuse any transcriptional errors.  Signed, Tajae Rybicki C. Duke Salvia, MD, [facility]  02/23/2017 1:05 PM    [facility]

## 2017-02-23 NOTE — Addendum Note (Signed)
Addended by: Regis BillPRATT, Karris Deangelo B on: 02/23/2017 05:35 PM   Modules accepted: Orders

## 2017-02-23 NOTE — Patient Instructions (Addendum)
Medication Instructions:  STOP CARVEDILOL   START BYSTOLIC 40 MG DAILY   Labwork: NONE  Testing/Procedures: NONE  Follow-Up: Your physician recommends that you schedule a follow-up appointment in: 3 MONTH OV  If you need a refill on your cardiac medications before your next appointment, please call your pharmacy.

## 2017-03-22 ENCOUNTER — Emergency Department (HOSPITAL_COMMUNITY): Payer: BLUE CROSS/BLUE SHIELD

## 2017-03-22 ENCOUNTER — Inpatient Hospital Stay (HOSPITAL_COMMUNITY)
Admission: EM | Admit: 2017-03-22 | Discharge: 2017-03-24 | DRG: 065 | Disposition: A | Discharge status: Home / Self Care | Payer: BLUE CROSS/BLUE SHIELD | Attending: Internal Medicine | Admitting: Internal Medicine

## 2017-03-22 ENCOUNTER — Encounter (HOSPITAL_COMMUNITY): Payer: Self-pay | Admitting: Radiology

## 2017-03-22 DIAGNOSIS — E1165 Type 2 diabetes mellitus with hyperglycemia: Secondary | ICD-10-CM | POA: Diagnosis present

## 2017-03-22 DIAGNOSIS — I361 Nonrheumatic tricuspid (valve) insufficiency: Secondary | ICD-10-CM | POA: Diagnosis not present

## 2017-03-22 DIAGNOSIS — I10 Essential (primary) hypertension: Secondary | ICD-10-CM

## 2017-03-22 DIAGNOSIS — Z7902 Long term (current) use of antithrombotics/antiplatelets: Secondary | ICD-10-CM

## 2017-03-22 DIAGNOSIS — E1122 Type 2 diabetes mellitus with diabetic chronic kidney disease: Secondary | ICD-10-CM | POA: Diagnosis present

## 2017-03-22 DIAGNOSIS — F17200 Nicotine dependence, unspecified, uncomplicated: Secondary | ICD-10-CM | POA: Diagnosis present

## 2017-03-22 DIAGNOSIS — Z720 Tobacco use: Secondary | ICD-10-CM

## 2017-03-22 DIAGNOSIS — I129 Hypertensive chronic kidney disease with stage 1 through stage 4 chronic kidney disease, or unspecified chronic kidney disease: Secondary | ICD-10-CM | POA: Diagnosis present

## 2017-03-22 DIAGNOSIS — Z9641 Presence of insulin pump (external) (internal): Secondary | ICD-10-CM | POA: Diagnosis present

## 2017-03-22 DIAGNOSIS — D509 Iron deficiency anemia, unspecified: Secondary | ICD-10-CM | POA: Diagnosis present

## 2017-03-22 DIAGNOSIS — E1143 Type 2 diabetes mellitus with diabetic autonomic (poly)neuropathy: Secondary | ICD-10-CM | POA: Diagnosis not present

## 2017-03-22 DIAGNOSIS — Z7982 Long term (current) use of aspirin: Secondary | ICD-10-CM

## 2017-03-22 DIAGNOSIS — E1065 Type 1 diabetes mellitus with hyperglycemia: Secondary | ICD-10-CM

## 2017-03-22 DIAGNOSIS — Z79899 Other long term (current) drug therapy: Secondary | ICD-10-CM

## 2017-03-22 DIAGNOSIS — I6529 Occlusion and stenosis of unspecified carotid artery: Secondary | ICD-10-CM | POA: Diagnosis present

## 2017-03-22 DIAGNOSIS — E1151 Type 2 diabetes mellitus with diabetic peripheral angiopathy without gangrene: Secondary | ICD-10-CM | POA: Diagnosis present

## 2017-03-22 DIAGNOSIS — Z794 Long term (current) use of insulin: Secondary | ICD-10-CM | POA: Diagnosis not present

## 2017-03-22 DIAGNOSIS — Z8673 Personal history of transient ischemic attack (TIA), and cerebral infarction without residual deficits: Secondary | ICD-10-CM | POA: Diagnosis not present

## 2017-03-22 DIAGNOSIS — Z881 Allergy status to other antibiotic agents status: Secondary | ICD-10-CM

## 2017-03-22 DIAGNOSIS — E7800 Pure hypercholesterolemia, unspecified: Secondary | ICD-10-CM | POA: Diagnosis present

## 2017-03-22 DIAGNOSIS — I639 Cerebral infarction, unspecified: Secondary | ICD-10-CM | POA: Diagnosis present

## 2017-03-22 DIAGNOSIS — I69354 Hemiplegia and hemiparesis following cerebral infarction affecting left non-dominant side: Secondary | ICD-10-CM

## 2017-03-22 DIAGNOSIS — N184 Chronic kidney disease, stage 4 (severe): Secondary | ICD-10-CM | POA: Diagnosis present

## 2017-03-22 DIAGNOSIS — E119 Type 2 diabetes mellitus without complications: Secondary | ICD-10-CM

## 2017-03-22 DIAGNOSIS — Z823 Family history of stroke: Secondary | ICD-10-CM | POA: Diagnosis not present

## 2017-03-22 DIAGNOSIS — I6350 Cerebral infarction due to unspecified occlusion or stenosis of unspecified cerebral artery: Secondary | ICD-10-CM | POA: Diagnosis not present

## 2017-03-22 DIAGNOSIS — IMO0002 Reserved for concepts with insufficient information to code with codable children: Secondary | ICD-10-CM

## 2017-03-22 LAB — DIFFERENTIAL
Basophils Absolute: 0 10*3/uL (ref 0.0–0.1)
Basophils Relative: 1 %
Eosinophils Absolute: 0.1 10*3/uL (ref 0.0–0.7)
Eosinophils Relative: 2 %
LYMPHS ABS: 1.2 10*3/uL (ref 0.7–4.0)
LYMPHS PCT: 21 %
Monocytes Absolute: 0.3 10*3/uL (ref 0.1–1.0)
Monocytes Relative: 5 %
NEUTROS PCT: 71 %
Neutro Abs: 4.1 10*3/uL (ref 1.7–7.7)

## 2017-03-22 LAB — I-STAT TROPONIN, ED: Troponin i, poc: 0 ng/mL (ref 0.00–0.08)

## 2017-03-22 LAB — CBC
HCT: 36.1 % — ABNORMAL LOW (ref 39.0–52.0)
Hemoglobin: 11.5 g/dL — ABNORMAL LOW (ref 13.0–17.0)
MCH: 24.8 pg — AB (ref 26.0–34.0)
MCHC: 31.9 g/dL (ref 30.0–36.0)
MCV: 77.8 fL — AB (ref 78.0–100.0)
Platelets: 202 10*3/uL (ref 150–400)
RBC: 4.64 MIL/uL (ref 4.22–5.81)
RDW: 14.4 % (ref 11.5–15.5)
WBC: 5.8 10*3/uL (ref 4.0–10.5)

## 2017-03-22 LAB — COMPREHENSIVE METABOLIC PANEL
ALBUMIN: 3.5 g/dL (ref 3.5–5.0)
ALK PHOS: 106 U/L (ref 38–126)
ALT: 17 U/L (ref 17–63)
ANION GAP: 7 (ref 5–15)
AST: 22 U/L (ref 15–41)
BILIRUBIN TOTAL: 0.8 mg/dL (ref 0.3–1.2)
BUN: 25 mg/dL — AB (ref 6–20)
CALCIUM: 8.8 mg/dL — AB (ref 8.9–10.3)
CO2: 20 mmol/L — ABNORMAL LOW (ref 22–32)
CREATININE: 3.23 mg/dL — AB (ref 0.61–1.24)
Chloride: 109 mmol/L (ref 101–111)
GFR calc Af Amer: 23 mL/min — ABNORMAL LOW (ref >60)
GFR calc non Af Amer: 20 mL/min — ABNORMAL LOW (ref >60)
GLUCOSE: 257 mg/dL — AB (ref 65–99)
Potassium: 5.4 mmol/L — ABNORMAL HIGH (ref 3.5–5.1)
Sodium: 136 mmol/L (ref 135–145)
TOTAL PROTEIN: 6.3 g/dL — AB (ref 6.5–8.1)

## 2017-03-22 LAB — I-STAT CHEM 8, ED
BUN: 27 mg/dL — AB (ref 6–20)
CALCIUM ION: 1.13 mmol/L — AB (ref 1.15–1.40)
CHLORIDE: 107 mmol/L (ref 101–111)
CREATININE: 3.3 mg/dL — AB (ref 0.61–1.24)
GLUCOSE: 247 mg/dL — AB (ref 65–99)
HCT: 38 % — ABNORMAL LOW (ref 39.0–52.0)
Hemoglobin: 12.9 g/dL — ABNORMAL LOW (ref 13.0–17.0)
Potassium: 5.2 mmol/L — ABNORMAL HIGH (ref 3.5–5.1)
Sodium: 137 mmol/L (ref 135–145)
TCO2: 20 mmol/L (ref 0–100)

## 2017-03-22 LAB — PROTIME-INR
INR: 1.08
Prothrombin Time: 14 seconds (ref 11.4–15.2)

## 2017-03-22 LAB — APTT: aPTT: 31 seconds (ref 24–36)

## 2017-03-22 LAB — CBG MONITORING, ED: Glucose-Capillary: 100 mg/dL — ABNORMAL HIGH (ref 65–99)

## 2017-03-22 MED ORDER — SODIUM CHLORIDE 0.9 % IV BOLUS (SEPSIS)
1000.0000 mL | Freq: Once | INTRAVENOUS | Status: AC
  Administered 2017-03-22: 1000 mL via INTRAVENOUS

## 2017-03-22 NOTE — ED Triage Notes (Signed)
[facility]- pt coming from home, works 3rd shift got home around 0900 and states he "felt off." Pt states he was playing on his computer and began feeling worse. He reports things were "cloudy." Pt had a TIA 2 years ago and had similar symptoms. Pt states his cbg was 75 so he ate something to increase that. CBG currently 221. Pt states he feels as though he can't focus. Pt alert and oriented currently. No unilateral weakness noted.

## 2017-03-22 NOTE — ED Triage Notes (Signed)
EDP in to look at patient. Will order stroke order set but not activate code stroke.

## 2017-03-22 NOTE — Consult Note (Addendum)
Referring Physician: Dr. Eulis Foster    Chief Complaint: Visual changes  HPI: Daniel Santos is an 56 y.o. male who presented to the ED for "delayed focusing" following eye movement. Symptoms began on 8/16 at about 0900 when he first noticed that he "felt off". He was using his computer and then began to feel worse with "cloudy" vision. When he gazed in different directions his vision would be blurry, but after a lag he could focus again. He had a TIA two years ago with similar symptoms.   He denies headache, confusion, difficulty speaking, limb weakness, limb numbness, chest pain, abdominal pain or limb pain.   MRI brain reveals an acute subcentimeter ischemic infarction within the left dorsal pons, in the region of the medial longitudinal fasciculus and paramedian pontine reticular formation. 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   PMHx includes atypical chest pain, HTN, hypercholesterolemia and DM.   Home medications include ASA, Plavix and Lipitor.  Past Medical History:  Diagnosis Date  . Atypical chest pain 12/14/2015  . Diabetes mellitus without complication ([place])   . Hemiparesis and alteration of sensations as late effects of stroke ([place]) 03/25/2015  . History of non-insulin dependent diabetes mellitus   . Hypercholesteremia   . Hypertension   . Palpitations 12/14/2015  . Renal disorder     Past Surgical History:  Procedure Laterality Date  . LUMBAR SPINE SURGERY  2005, 2008, 2010  . REPAIR OF FRACTURED PENIS  05/2000   with cystoscopy    Family History  Problem Relation Age of Onset  . Cancer Mother   . Diabetes Paternal Aunt        x2  . Leukemia Paternal Uncle   . Stroke Paternal Grandmother   . Diabetes Paternal Grandmother   . Breast cancer Maternal Grandmother    . Breast cancer Paternal Grandmother   . Kidney disease Father    Social History:  reports that he has been smoking.  He has been smoking about 1.00 pack per day. He has never used smokeless tobacco. He reports that he does not drink alcohol or use drugs.  Allergies:  Allergies  Allergen Reactions  . Doxycycline Nausea Only    (kidney function?)    Medications:  Prior to Admission:  Prescriptions Prior to Admission  Medication Sig Dispense Refill Last Dose  . amLODipine (NORVASC) 10 MG tablet Take 10 mg by mouth every evening.    03/21/2017 at Unknown time  . aspirin EC 81 MG tablet Take 81 mg by mouth every evening.    03/21/2017 at Unknown time  . atorvastatin (LIPITOR) 20 MG tablet Take 20 mg by mouth every evening.    03/21/2017 at Unknown time  . BUTRANS 20 MCG/HR PTWK patch Place 1 patch onto the skin once a week.   03/18/2017  . calcitRIOL (ROCALTROL) 0.25 MCG capsule Take 0.25 mcg by mouth every other day. Monday, Wednesday, Friday   03/21/2017 at Unknown time  . clopidogrel (PLAVIX) 75 MG tablet Take 1 tablet (75 mg total) by mouth daily. (Patient taking differently: Take 75 mg by mouth every evening. ) 30 tablet 0 03/21/2017 at Unknown time  . ferrous sulfate 325 (65 FE) MG tablet Take 325 mg by mouth every evening.    03/21/2017 at Unknown time  . HUMALOG 100 UNIT/ML injection Inject into the skin.  03/22/2017 at Unknown time  . Insulin Human (INSULIN PUMP) SOLN Inject into the skin. Humulog Insulin Pump   03/22/2017 at Unknown time  . LINZESS 145 MCG CAPS capsule Take 145 mcg by mouth daily as needed. constipation   prn  . Nebivolol HCl (BYSTOLIC) 20 MG TABS Take 2 tablets (40 mg total) by mouth daily. (Patient taking differently: Take 40 mg by mouth every evening. ) 60 tablet 3 03/21/2017 at 10pm  . pregabalin (LYRICA) 75 MG capsule Take 75 mg by mouth 2 (two) times daily as needed (pain).   prn  . promethazine (PHENERGAN) 25 MG tablet Take 1 tablet (25 mg total) by mouth every 6  (six) hours as needed for nausea or vomiting. 30 tablet 0 prn   Scheduled: . aspirin EC  325 mg Oral QPM  . atorvastatin  20 mg Oral QPM  . [START ON 03/25/2017] buprenorphine  20 mcg Transdermal Q Sun  . calcitRIOL  0.25 mcg Oral Q M,W,F  . clopidogrel  75 mg Oral QPM  . enoxaparin (LOVENOX) injection  30 mg Subcutaneous Q24H  . ferrous sulfate  325 mg Oral QPM  . insulin pump   Subcutaneous TID AC, HS, 0200    ROS: As per HPI.   Physical Examination: Blood pressure (!) 141/87, pulse 66, resp. rate 16, SpO2 99 %.  HEENT: [place]/AT Lungs: Respirations unlabored Ext: No edema  Neurologic Examination: Mental Status: Alert, oriented, thought content appropriate. Speech fluent without evidence of aphasia.  Able to follow all commands without difficulty. Cranial Nerves:  II:  Visual fields intact, PERRL III,IV, VI: Ptosis not present. Horizontal versions to the right are normal. When gazing back to the left, there is a lag in abduction of the left globe. Mild esotropia noted on upgaze. Downgaze is normal. No nystagmus seen.   V,VII: smile symmetric, facial temp sensation normal bilaterally VIII: hearing intact to voice IX,X: no hypophonia XI: Symmetric shoulder shrug XII: midline tongue extension  Motor: Right : Upper extremity   5/5    Left:     Upper extremity   5/5  Lower extremity   5/5     Lower extremity   5/5 Normal tone throughout; no atrophy noted Sensory: Temp and light touch intact x 4. No extinction. Deep Tendon Reflexes:  2+ bilateral upper extremities. 0 right patella, 2+ left patellae and bilateral achilles.  Plantars: Right: downgoing  Left: downgoing Cerebellar: No ataxia with FNF bilaterally.  Gait: Deferred   Results for orders placed or performed during the hospital encounter of 03/22/17 (from the past 48 hour(s))  Protime-INR     Status: None   Collection Time: 03/22/17  2:55 PM  Result Value Ref Range   Prothrombin Time 14.0 11.4 - 15.2 seconds   INR 1.08    APTT     Status: None   Collection Time: 03/22/17  2:55 PM  Result Value Ref Range   aPTT 31 24 - 36 seconds  CBC     Status: Abnormal   Collection Time: 03/22/17  2:55 PM  Result Value Ref Range   WBC 5.8 4.0 - 10.5 K/uL   RBC 4.64 4.22 - 5.81 MIL/uL   Hemoglobin 11.5 (L) 13.0 - 17.0 g/dL   HCT 36.1 (L) 39.0 - 52.0 %   MCV 77.8 (L) 78.0 - 100.0 fL   MCH 24.8 (L) 26.0 - 34.0 pg   MCHC 31.9 30.0 - 36.0 g/dL   RDW 14.4 11.5 - 15.5 %   Platelets 202  150 - 400 K/uL  Differential     Status: None   Collection Time: 03/22/17  2:55 PM  Result Value Ref Range   Neutrophils Relative % 71 %   Neutro Abs 4.1 1.7 - 7.7 K/uL   Lymphocytes Relative 21 %   Lymphs Abs 1.2 0.7 - 4.0 K/uL   Monocytes Relative 5 %   Monocytes Absolute 0.3 0.1 - 1.0 K/uL   Eosinophils Relative 2 %   Eosinophils Absolute 0.1 0.0 - 0.7 K/uL   Basophils Relative 1 %   Basophils Absolute 0.0 0.0 - 0.1 K/uL  Comprehensive metabolic panel     Status: Abnormal   Collection Time: 03/22/17  2:55 PM  Result Value Ref Range   Sodium 136 135 - 145 mmol/L   Potassium 5.4 (H) 3.5 - 5.1 mmol/L   Chloride 109 101 - 111 mmol/L   CO2 20 (L) 22 - 32 mmol/L   Glucose, Bld 257 (H) 65 - 99 mg/dL   BUN 25 (H) 6 - 20 mg/dL   Creatinine, Ser 3.23 (H) 0.61 - 1.24 mg/dL   Calcium 8.8 (L) 8.9 - 10.3 mg/dL   Total Protein 6.3 (L) 6.5 - 8.1 g/dL   Albumin 3.5 3.5 - 5.0 g/dL   AST 22 15 - 41 U/L   ALT 17 17 - 63 U/L   Alkaline Phosphatase 106 38 - 126 U/L   Total Bilirubin 0.8 0.3 - 1.2 mg/dL   GFR calc non Af Amer 20 (L) >60 mL/min   GFR calc Af Amer 23 (L) >60 mL/min    Comment: (NOTE) The eGFR has been calculated using the CKD EPI equation. This calculation has not been validated in all clinical situations. eGFR's persistently <60 mL/min signify possible Chronic Kidney Disease.    Anion gap 7 5 - 15  I-stat troponin, ED     Status: None   Collection Time: 03/22/17  3:10 PM  Result Value Ref Range   Troponin i, poc 0.00  0.00 - 0.08 ng/mL   Comment 3            Comment: Due to the release kinetics of cTnI, a negative result within the first hours of the onset of symptoms does not rule out myocardial infarction with certainty. If myocardial infarction is still suspected, repeat the test at appropriate intervals.   I-Stat Chem 8, ED     Status: Abnormal   Collection Time: 03/22/17  3:13 PM  Result Value Ref Range   Sodium 137 135 - 145 mmol/L   Potassium 5.2 (H) 3.5 - 5.1 mmol/L   Chloride 107 101 - 111 mmol/L   BUN 27 (H) 6 - 20 mg/dL   Creatinine, Ser 3.30 (H) 0.61 - 1.24 mg/dL   Glucose, Bld 247 (H) 65 - 99 mg/dL   Calcium, Ion 1.13 (L) 1.15 - 1.40 mmol/L   TCO2 20 0 - 100 mmol/L   Hemoglobin 12.9 (L) 13.0 - 17.0 g/dL   HCT 38.0 (L) 39.0 - 52.0 %  CBG monitoring, ED     Status: Abnormal   Collection Time: 03/22/17 11:06 PM  Result Value Ref Range   Glucose-Capillary 100 (H) 65 - 99 mg/dL   Ct Head Wo Contrast  Result Date: 03/22/2017 CLINICAL DATA:  Abnormal sensation and blurred vision today. EXAM: CT HEAD WITHOUT CONTRAST TECHNIQUE: Contiguous axial images were obtained from the base of the skull through the vertex without intravenous contrast. COMPARISON:  01/12/2015 FINDINGS: Brain: There is old infarction in the  right side of the pons which was acute in June of 2016. No cerebellar abnormality is seen. Cerebral hemispheres show mild chronic small-vessel ischemic changes of the deep white matter. No cortical or large vessel territory infarction. No mass lesion, hemorrhage, hydrocephalus or extra-axial collection. Vascular: There is atherosclerotic calcification of the major vessels at the base of the brain. Skull: Normal Sinuses/Orbits: Clear/normal Other: None IMPRESSION: No acute finding by CT. 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 Mild chronic small-vessel ischemic changes of the cerebral hemispheric deep white matter. Electronically Signed   By: Nelson Chimes M.D.   On: 03/22/2017 17:13   Mr Brain Wo  Contrast (neuro Protocol)  Result Date: 03/22/2017 CLINICAL DATA:  Suspicion of stroke. Altered sensation. Blurry vision. EXAM: MRI HEAD WITHOUT CONTRAST TECHNIQUE: Multiplanar, multiecho pulse sequences of the brain and surrounding structures were obtained without intravenous contrast. COMPARISON:  Head CT 03/22/2017 FINDINGS: Brain: The midline structures are normal. There is a small, linear focus of diffusion restriction within the dorsal left pons, in the region of the medial longitudinal fasciculus and paramedian pontine reticular formation. There is multifocal periventricular leukoaraiosis. There is also an old right pontine infarct. Some of these lesions are oriented perpendicularly to the long axis of the lateral ventricles. The extent of white matter disease has markedly worsened. No intraparenchymal hematoma or chronic microhemorrhage. Mildly advanced atrophy for age. The dura is normal and there is no extra-axial collection. Vascular: Major intracranial arterial and venous sinus flow voids are preserved. Skull and upper cervical spine: The visualized skull base, calvarium, upper cervical spine and extracranial soft tissues are normal. Sinuses/Orbits: No fluid levels or advanced mucosal thickening. No mastoid or middle ear effusion. Normal orbits. IMPRESSION: 1. Acute ischemia within the left dorsal pons, in the region of the medial longitudinal fasciculus and paramedian pontine reticular formation. Correlate for horizontal gaze palsy. 2. Marked worsening of multifocal periventricular white matter hyperintensity. It is possible that this is secondary to chronic hypertensive microangiopathy; however, the orientation of the lesions and the progression since the prior MRI a are very concerning for demyelinating disease. CSF sampling or postcontrast MR imaging, including sagittal T2/FLAIR sequence, should be considered. 3. 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 Electronically Signed   By: Ulyses Jarred M.D.   On:  03/22/2017 21:54    Assessment: 56 y.o. male with acute dorsal pontine ischemic infarction 1. MRI brain reveals an acute subcentimeter ischemic infarction within the left dorsal pons, in the region of the medial longitudinal fasciculus and paramedian pontine reticular formation. 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 Based upon my review of the images, hypertensive microangiopathy appears more likely.  2. Stroke Risk Factors - HTN, hypercholesterolemia and DM.  3. Hemoglobin A1c is 7.4. 4. CKD  Plan: 1. Agree with increasing ASA dose to 325 mg qd. 2. Continue Plavix. 3. Increase Lipitor dose to 40 mg qd.  4. MRA of the brain without contrast 5. TTE 6. Carotid ultrasound 7. Cardiac telemetry 8. PT consult, OT consult, Speech consult 9. Risk factor modification 10. Frequent neuro checks 11. CK level 12. BP management  '@Electronically'  signed: Dr. Kerney Elbe  03/22/2017, 11:55 PM

## 2017-03-22 NOTE — ED Provider Notes (Signed)
[facility]-EMERGENCY DEPT Provider Note   CSN: [id number] Arrival date & time: 03/22/17  1433     History   Chief Complaint Chief Complaint  Patient presents with  . Fatigue    "Feels Off"    HPI Daniel Santos is a 56 y.o. male with history of DM, HTN, HLD, acute CVA, acute renal failure who presents today with acute onset, persisting vision changes and "feeling weird". He states that these symptoms feel very similar to his CVA 2 years ago. He states that he works third shift, left work at around 8 AM and arrived in [place] 1 hour later. He states that shortly after arriving home he developed "a weird feeling "that persisted for 1-2 minutes. His symptoms included a slightly unstable gait and "difficulty focusing my vision". He denies diplopia or blurry vision but states that "when I move my head and try to focus on something new it takes a long time to focus ". He believes this vision change was also contributing to his slightly unstable gait. His symptoms resolved after 1 minute and then shortly thereafter resumed and have persisted. He denies facial droop, aphasia, or altered mental status. No numbness, tingling, or weakness. He denies chest pain, shortness of breath, abdominal pain, n/v/d. He does endorse decreased urine production but states this has been persistent for sometime.   The history is provided by the patient.    Past Medical History:  Diagnosis Date  . Atypical chest pain 12/14/2015  . Diabetes mellitus without complication (HCC)   . 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 History of non-insulin dependent diabetes mellitus   . Hypercholesteremia   . Hypertension   . Palpitations 12/14/2015  . Renal disorder     Patient Active Problem List   Diagnosis Date Noted  . CKD (chronic kidney disease), stage IV (HCC) 03/23/2017  . Microcytic anemia 03/23/2017  . 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 Acute CVA (cerebrovascular accident) (HCC) 01/12/2015  . Essential hypertension 01/12/2015  . Tobacco abuse 01/12/2015  . Diabetes mellitus (HCC) 02/12/2009    Past Surgical History:  Procedure Laterality Date  . LUMBAR SPINE SURGERY  2005, 2008, 2010  . REPAIR OF FRACTURED PENIS  05/2000   with cystoscopy       Home Medications    Prior to Admission medications   Medication Sig Start Date End Date Taking? Authorizing Provider  amLODipine (NORVASC) 10 MG tablet Take 10 mg by mouth every evening.    Yes [provider]  aspirin EC 81 MG tablet Take 81 mg by mouth every evening.    Yes [provider]  atorvastatin (LIPITOR) 20 MG tablet Take 20 mg by mouth every evening.    Yes [provider]  BUTRANS 20 MCG/HR PTWK patch Place 1 patch onto the skin once a week. 10/06/15  Yes [provider]  calcitRIOL (ROCALTROL) 0.25 MCG capsule Take 0.25 mcg by mouth every other day. Monday, Wednesday, Friday   Yes [provider]  clopidogrel (PLAVIX) 75 MG tablet Take 1 tablet (75 mg total) by mouth daily. Patient taking differently: Take 75 mg by mouth every evening.  01/14/15  Yes Rhetta MuraSamtani, Jai-Gurmukh, MD  ferrous sulfate 325 (65 FE) MG tablet Take 325 mg by mouth every evening.    Yes [provider]  HUMALOG 100 UNIT/ML injection Inject into the skin.  10/19/14  Yes [provider]  Insulin Human (INSULIN PUMP) SOLN Inject into the skin. Humulog Insulin Pump   Yes [provider]  LINZESS 145 MCG CAPS capsule Take 145 mcg by mouth daily as needed. constipation 10/14/14  Yes [provider]  Nebivolol HCl (BYSTOLIC) 20 MG TABS Take 2 tablets (40 mg total) by mouth daily. Patient taking differently: Take 40 mg by mouth every evening.  02/23/17  Yes Chilton Si, MD  pregabalin (LYRICA) 75 MG capsule Take 75 mg by mouth 2 (two) times daily as needed (pain).   Yes [provider]  promethazine  (PHENERGAN) 25 MG tablet Take 1 tablet (25 mg total) by mouth every 6 (six) hours as needed for nausea or vomiting. 02/23/17  Yes Chilton Si, MD    Family History Family History  Problem Relation Age of Onset  . Cancer Mother   . Leukemia Paternal Uncle   . Stroke Paternal Grandmother   . Diabetes Paternal Grandmother   . Breast cancer Paternal Grandmother   . Kidney disease Father   . Diabetes Paternal Aunt        x2  . Breast cancer Maternal Grandmother     Social History Social History  Substance Use Topics  . Smoking status: Current Every Day Smoker    Packs/day: 1.00  . Smokeless tobacco: Never Used  . Alcohol use No     Allergies   Doxycycline   Review of Systems Review of Systems  Constitutional: Negative for chills and fever.  Eyes: Positive for visual disturbance.  Respiratory: Negative for shortness of breath.   Cardiovascular: Negative for chest pain.  Gastrointestinal: Negative for abdominal pain, constipation, diarrhea, nausea and vomiting.  Genitourinary: Negative for dysuria and hematuria.  Musculoskeletal: Positive for gait problem.  Neurological: Negative for dizziness, syncope, facial asymmetry, speech difficulty, weakness, light-headedness, numbness and headaches.  Psychiatric/Behavioral: Negative for confusion.  All other systems reviewed and are negative.    Physical Exam Updated Vital Signs BP (!) 146/93   Pulse 71   Resp 15   SpO2 99%   Physical Exam  Constitutional: He is oriented to person, place, and time. He appears well-developed and well-nourished. No distress.  HENT:  Head: Normocephalic and atraumatic.  Right Ear: External ear normal.  Left Ear: External ear normal.  Mouth/Throat: Oropharynx is clear and moist.  Eyes: Pupils are equal, round, and reactive to light. Conjunctivae and EOM are normal. Right eye exhibits no discharge. Left eye exhibits no discharge.  No pain with EOMs. Right eye lags subtly compared to left  eye movements   Visual Acuity  Right Eye Distance: 10/10.25 Left Eye Distance: 10/10.25 Bilateral Distance: 10/10     Neck: Normal range of motion. Neck supple. No JVD present. No tracheal deviation present.  Cardiovascular: Normal rate, regular rhythm, normal heart sounds and intact distal pulses.  Exam reveals no gallop and no friction rub.   No murmur heard. Pulmonary/Chest: Effort normal and breath sounds normal. No respiratory distress. He has no wheezes. He has no rales. He exhibits no tenderness.  Abdominal: Soft. Bowel sounds are normal. He exhibits no distension. There is no tenderness.  Musculoskeletal: He exhibits no edema.  Neurological: He is alert and oriented to person, place, and time. No cranial nerve deficit or sensory deficit.  Mental Status:  Alert, thought content appropriate, able to give a coherent history. Speech fluent without evidence of aphasia. Able to follow 2 step commands without difficulty.  Cranial Nerves:  II:  Peripheral visual fields grossly normal, pupils equal,  round, reactive to light III,IV, VI: ptosis not present, extra-ocular motions intact bilaterally  V,VII: smile symmetric, facial light touch sensation equal VIII: hearing grossly normal to voice  X: uvula elevates symmetrically  XI: bilateral shoulder shrug symmetric and strong XII: midline tongue extension without fassiculations Motor:  Normal tone. 5/5 strength of BUE and BLE major muscle groups including strong and equal grip strength and dorsiflexion/plantar flexion Sensory: light touch normal in all extremities. Cerebellar: normal finger-to-nose with bilateral upper extremities, normal heel to shin Gait: normal gait and balance. Able to walk on toes and heels with ease.  CV: 2+ radial and DP/PT pulses   Skin: Skin is warm and dry. No erythema.  Psychiatric: He has a normal mood and affect. His behavior is normal.  Nursing note and vitals reviewed.    ED Treatments / Results    Labs (all labs ordered are listed, but only abnormal results are displayed) Labs Reviewed  CBC - Abnormal; Notable for the following:       Result Value   Hemoglobin 11.5 (*)    HCT 36.1 (*)    MCV 77.8 (*)    MCH 24.8 (*)    All other components within normal limits  COMPREHENSIVE METABOLIC PANEL - Abnormal; Notable for the following:    Potassium 5.4 (*)    CO2 20 (*)    Glucose, Bld 257 (*)    BUN 25 (*)    Creatinine, Ser 3.23 (*)    Calcium 8.8 (*)    Total Protein 6.3 (*)    GFR calc non Af Amer 20 (*)    GFR calc Af Amer 23 (*)    All other components within normal limits  CBG MONITORING, ED - Abnormal; Notable for the following:    Glucose-Capillary 100 (*)    All other components within normal limits  I-STAT CHEM 8, ED - Abnormal; Notable for the following:    Potassium 5.2 (*)    BUN 27 (*)    Creatinine, Ser 3.30 (*)    Glucose, Bld 247 (*)    Calcium, Ion 1.13 (*)    Hemoglobin 12.9 (*)    HCT 38.0 (*)    All other components within normal limits  PROTIME-INR  APTT  DIFFERENTIAL  I-STAT TROPONIN, ED    EKG  EKG Interpretation None       Radiology Ct Head Wo Contrast  Result Date: 03/22/2017 CLINICAL DATA:  Abnormal sensation and blurred vision today. EXAM: CT HEAD WITHOUT CONTRAST TECHNIQUE: Contiguous axial images were obtained from the base of the skull through the vertex without intravenous contrast. COMPARISON:  01/12/2015 FINDINGS: Brain: There is old infarction in the right side of the pons which was acute in June of 2016. No cerebellar abnormality is seen. Cerebral hemispheres show mild chronic small-vessel ischemic changes of the deep white matter. No cortical or large vessel territory infarction. No mass lesion, hemorrhage, hydrocephalus or extra-axial collection. Vascular: There is atherosclerotic calcification of the major vessels at the base of the brain. Skull: Normal Sinuses/Orbits: Clear/normal Other: None IMPRESSION: No acute finding by  CT. 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 Mild chronic small-vessel ischemic changes of the cerebral hemispheric deep white matter. Electronically Signed   By: Paulina Fusi M.D.   On: 03/22/2017 17:13   Mr Brain Wo Contrast (neuro Protocol)  Result Date: 03/22/2017 CLINICAL DATA:  Suspicion of stroke. Altered sensation. Blurry vision. EXAM: MRI HEAD WITHOUT CONTRAST TECHNIQUE: Multiplanar, multiecho pulse sequences of the brain and surrounding structures were  obtained without intravenous contrast. COMPARISON:  Head CT 03/22/2017 FINDINGS: Brain: The midline structures are normal. There is a small, linear focus of diffusion restriction within the dorsal left pons, in the region of the medial longitudinal fasciculus and paramedian pontine reticular formation. There is multifocal periventricular leukoaraiosis. There is also an old right pontine infarct. Some of these lesions are oriented perpendicularly to the long axis of the lateral ventricles. The extent of white matter disease has markedly worsened. No intraparenchymal hematoma or chronic microhemorrhage. Mildly advanced atrophy for age. The dura is normal and there is no extra-axial collection. Vascular: Major intracranial arterial and venous sinus flow voids are preserved. Skull and upper cervical spine: The visualized skull base, calvarium, upper cervical spine and extracranial soft tissues are normal. Sinuses/Orbits: No fluid levels or advanced mucosal thickening. No mastoid or middle ear effusion. Normal orbits. IMPRESSION: 1. Acute ischemia within the left dorsal pons, in the region of the medial longitudinal fasciculus and paramedian pontine reticular formation. Correlate for horizontal gaze palsy. 2. Marked worsening of multifocal periventricular white matter hyperintensity. It is possible that this is secondary to chronic hypertensive microangiopathy; however, the orientation of the lesions and the progression since the prior MRI a are very concerning for  demyelinating disease. CSF sampling or postcontrast MR imaging, including sagittal T2/FLAIR sequence, should be considered. 3. 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 Electronically Signed   By: Deatra Robinson M.D.   On: 03/22/2017 21:54    Procedures Procedures (including critical care time)  Medications Ordered in ED Medications  sodium chloride 0.9 % bolus 1,000 mL (1,000 mLs Intravenous New Bag/Given 03/22/17 1816)     Initial Impression / Assessment and Plan / ED Course  I have reviewed the triage vital signs and the nursing notes.  Pertinent labs & imaging results that were available during my care of the patient were reviewed by me and considered in my medical decision making (see chart for details).     Patient with complaint of vision changes, abnormal gait, and "feeling weird" since 9 AM this morning. No focal neurological deficits on examination aside from slight lag of the right eye during EOMs. His laboratory show a slight increase in his creatinine from last lab work in May, given IV fluids while in the ED.CT head shows no acute findings but MRI shows acute ischemia within the left dorsal pons. Also shows marked worsening of multifocal periventricular white matter hyperintensity which radiology finds concerning for demyelinating disease. Spoke with Dr. Otelia Limes with neurology, who agrees to assess patient further.  Spoke with Dr. Virl Son, hospitalist, who agrees to bring the patient into the hospital for management and stroke workup.   Final Clinical Impressions(s) / ED Diagnoses   Final diagnoses:  Left pontine cerebrovascular accident [facility])    New Prescriptions New Prescriptions   No medications on file     Bennye Alm 03/23/17 0115    Mancel Bale, MD 03/23/17 1043

## 2017-03-22 NOTE — ED Notes (Signed)
Pt back from MRI 

## 2017-03-22 NOTE — ED Notes (Addendum)
Pt transported to MRI 

## 2017-03-23 ENCOUNTER — Encounter (HOSPITAL_COMMUNITY): Payer: Self-pay | Admitting: Family Medicine

## 2017-03-23 ENCOUNTER — Inpatient Hospital Stay (HOSPITAL_COMMUNITY): Payer: BLUE CROSS/BLUE SHIELD

## 2017-03-23 DIAGNOSIS — N184 Chronic kidney disease, stage 4 (severe): Secondary | ICD-10-CM

## 2017-03-23 DIAGNOSIS — I639 Cerebral infarction, unspecified: Secondary | ICD-10-CM

## 2017-03-23 DIAGNOSIS — I10 Essential (primary) hypertension: Secondary | ICD-10-CM

## 2017-03-23 DIAGNOSIS — D509 Iron deficiency anemia, unspecified: Secondary | ICD-10-CM

## 2017-03-23 DIAGNOSIS — Z8673 Personal history of transient ischemic attack (TIA), and cerebral infarction without residual deficits: Secondary | ICD-10-CM

## 2017-03-23 DIAGNOSIS — E1143 Type 2 diabetes mellitus with diabetic autonomic (poly)neuropathy: Secondary | ICD-10-CM

## 2017-03-23 DIAGNOSIS — Z794 Long term (current) use of insulin: Secondary | ICD-10-CM

## 2017-03-23 DIAGNOSIS — I361 Nonrheumatic tricuspid (valve) insufficiency: Secondary | ICD-10-CM

## 2017-03-23 HISTORY — DX: Chronic kidney disease, stage 4 (severe): N18.4

## 2017-03-23 LAB — LIPID PANEL
CHOLESTEROL: 98 mg/dL (ref 0–200)
HDL: 40 mg/dL — ABNORMAL LOW (ref >40)
LDL Cholesterol: 37 mg/dL (ref 0–99)
TRIGLYCERIDES: 105 mg/dL (ref <150)
Total CHOL/HDL Ratio: 2.5 RATIO
VLDL: 21 mg/dL (ref 0–40)

## 2017-03-23 LAB — GLUCOSE, CAPILLARY
GLUCOSE-CAPILLARY: 50 mg/dL — AB (ref 65–99)
GLUCOSE-CAPILLARY: 82 mg/dL (ref 65–99)
GLUCOSE-CAPILLARY: 161 mg/dL — AB (ref 65–99)
GLUCOSE-CAPILLARY: 148 mg/dL — AB (ref 65–99)
Glucose-Capillary: 299 mg/dL — ABNORMAL HIGH (ref 65–99)
Glucose-Capillary: 424 mg/dL — ABNORMAL HIGH (ref 65–99)

## 2017-03-23 LAB — ECHOCARDIOGRAM COMPLETE
HEIGHTINCHES: 69 in
Weight: 2115.2 oz

## 2017-03-23 LAB — HIV ANTIBODY (ROUTINE TESTING W REFLEX): HIV SCREEN 4TH GENERATION: NONREACTIVE

## 2017-03-23 LAB — HEMOGLOBIN A1C
HEMOGLOBIN A1C: 7.4 % — AB (ref 4.8–5.6)
Mean Plasma Glucose: 165.68 mg/dL

## 2017-03-23 MED ORDER — ASPIRIN EC 325 MG PO TBEC
325.0000 mg | DELAYED_RELEASE_TABLET | Freq: Every evening | ORAL | Status: DC
  Administered 2017-03-23: 325 mg via ORAL
  Filled 2017-03-23 (×2): qty 1

## 2017-03-23 MED ORDER — ACETAMINOPHEN 325 MG PO TABS: 650.0000 mg | ORAL_TABLET | ORAL | Status: DC | PRN

## 2017-03-23 MED ORDER — STROKE: EARLY STAGES OF RECOVERY BOOK
Freq: Once | Status: AC
  Administered 2017-03-23: 03:00:00
  Filled 2017-03-23: qty 1

## 2017-03-23 MED ORDER — CALCITRIOL 0.25 MCG PO CAPS
0.2500 ug | ORAL_CAPSULE | ORAL | Status: DC
  Administered 2017-03-23: 0.25 ug via ORAL
  Filled 2017-03-23: qty 1

## 2017-03-23 MED ORDER — FERROUS SULFATE 325 (65 FE) MG PO TABS
325.0000 mg | ORAL_TABLET | Freq: Every evening | ORAL | Status: DC
  Administered 2017-03-23: 325 mg via ORAL
  Filled 2017-03-23 (×2): qty 1

## 2017-03-23 MED ORDER — PREGABALIN 25 MG PO CAPS: 75.0000 mg | ORAL_CAPSULE | Freq: Two times a day (BID) | ORAL | Status: DC | PRN

## 2017-03-23 MED ORDER — BUPRENORPHINE 20 MCG/HR TD PTWK: 20.0000 ug | MEDICATED_PATCH | TRANSDERMAL | Status: DC

## 2017-03-23 MED ORDER — ACETAMINOPHEN 650 MG RE SUPP
650.0000 mg | RECTAL | Status: DC | PRN
Start: 2017-03-23 — End: 2017-03-24

## 2017-03-23 MED ORDER — CLOPIDOGREL BISULFATE 75 MG PO TABS
75.0000 mg | ORAL_TABLET | Freq: Every evening | ORAL | Status: DC
Start: 2017-03-23 — End: 2017-03-24
  Filled 2017-03-23 (×2): qty 1

## 2017-03-23 MED ORDER — SENNOSIDES-DOCUSATE SODIUM 8.6-50 MG PO TABS: 1.0000 | ORAL_TABLET | Freq: Every evening | ORAL | Status: DC | PRN

## 2017-03-23 MED ORDER — ASPIRIN EC 81 MG PO TBEC: 81.0000 mg | DELAYED_RELEASE_TABLET | Freq: Every evening | ORAL | Status: DC

## 2017-03-23 MED ORDER — ACETAMINOPHEN 160 MG/5ML PO SOLN: 650.0000 mg | ORAL | Status: DC | PRN

## 2017-03-23 MED ORDER — ENOXAPARIN SODIUM 30 MG/0.3ML ~~LOC~~ SOLN
30.0000 mg | SUBCUTANEOUS | Status: DC
  Administered 2017-03-23: 30 mg via SUBCUTANEOUS
  Filled 2017-03-23: qty 0.3

## 2017-03-23 MED ORDER — SODIUM CHLORIDE 0.9 % IV SOLN
INTRAVENOUS | Status: DC
  Administered 2017-03-23: 13:00:00 via INTRAVENOUS

## 2017-03-23 MED ORDER — ATORVASTATIN CALCIUM 20 MG PO TABS
20.0000 mg | ORAL_TABLET | Freq: Every evening | ORAL | Status: DC
  Administered 2017-03-23: 20 mg via ORAL
  Filled 2017-03-23 (×2): qty 2
  Filled 2017-03-23 (×2): qty 1

## 2017-03-23 MED ORDER — INSULIN PUMP
Freq: Three times a day (TID) | SUBCUTANEOUS | Status: DC
  Administered 2017-03-23: 07:00:00 via SUBCUTANEOUS
  Administered 2017-03-23: 16 via SUBCUTANEOUS
  Administered 2017-03-23: 4.3 via SUBCUTANEOUS
  Administered 2017-03-24: 3 via SUBCUTANEOUS
  Filled 2017-03-23: qty 1

## 2017-03-23 NOTE — Progress Notes (Signed)
  Echocardiogram 2D Echocardiogram has been performed.  Margy Sumler 03/23/2017, 4:12 PM

## 2017-03-23 NOTE — H&P (Signed)
History and Physical  Patient Name: Daniel Santos     MMN:[id number]    DOB: 05-May-1961    DOA: 03/22/2017 PCP: Truman Hayward, FNP   Patient coming from: Home     Chief Complaint: Visual disturbance  HPI: Daniel Santos is a 56 y.o. male with a past medical history significant for IDDM, HTN, hx of CVA and hx of CKD IV who presents with vision changes.  The patient was in his usual state of health until this morning when he got home from his third shift job and felt "off". He tried to get some sleep, but continued to feel "cloudy all morning". Primarily we complains of his "difficulty focusing", and having to concentrate very hard to focus when he moves his gaze to something new.  This has persisted all day, so finally he came to the ER.  He has had no syncope.  He has had no unilateral weakness, slurred speech.    ED course: -Heart rate 64, RR normal, SpO2 normal, BP 148/97 -Na 136, K 5.4, Cr 3.2 (baseline 2.9), WBC 5.8K, Hgb 11.5 -Coags normal -CT head showed old infart -MR was obtained and showed new dorsal pons infarct -Case was discussed with Neurology and TRH were asked to admit    Review of systems:  Review of Systems  Constitutional: Negative for chills and fever.  Eyes: Positive for blurred vision.  Neurological: Negative for dizziness, tingling, tremors, sensory change, speech change, focal weakness, seizures, loss of consciousness, weakness and headaches.  All other systems reviewed and are negative.        Past Medical History:  Diagnosis Date  . Atypical chest pain 12/14/2015  . Diabetes mellitus without complication (HCC)   . 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 History of non-insulin dependent diabetes mellitus   . Hypercholesteremia   . Hypertension   . Palpitations 12/14/2015  . Renal disorder     Past Surgical History:  Procedure Laterality Date  . LUMBAR SPINE SURGERY  2005, 2008, 2010  . REPAIR OF  FRACTURED PENIS  05/2000   with cystoscopy    Social History: Patient lives with his girlfriend.  Patient walks unassisted.  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  He smokes.    Allergies  Allergen Reactions  . Doxycycline Nausea Only    (kidney function?)    Family history: family history includes Breast cancer in his maternal grandmother and paternal grandmother; Cancer in his mother; Diabetes in his paternal aunt and paternal grandmother; Kidney disease in his father; Leukemia in his paternal uncle; Stroke in his paternal grandmother.  Prior to Admission medications   Medication Sig Start Date End Date Taking? Authorizing Provider  amLODipine (NORVASC) 10 MG tablet Take 10 mg by mouth every evening.    Yes [provider]  aspirin EC 81 MG tablet Take 81 mg by mouth every evening.    Yes [provider]  atorvastatin (LIPITOR) 20 MG tablet Take 20 mg by mouth every evening.    Yes [provider]  BUTRANS 20 MCG/HR PTWK patch Place 1 patch onto the skin once a week. 10/06/15  Yes [provider]  calcitRIOL (ROCALTROL) 0.25 MCG capsule Take 0.25 mcg by mouth every other day. Monday, Wednesday, Friday   Yes [provider]  clopidogrel (PLAVIX) 75 MG tablet Take 1 tablet (75 mg total) by mouth daily. Patient taking differently: Take 75 mg by mouth every evening.  01/14/15  Yes Rhetta Mura, MD  ferrous sulfate 325 (65 FE) MG tablet Take 325 mg by mouth every evening.    Yes [provider]  HUMALOG 100 UNIT/ML injection Inject into the skin.  10/19/14  Yes [provider]  Insulin Human (INSULIN PUMP) SOLN Inject into the skin. Humulog Insulin Pump   Yes [provider]  LINZESS 145 MCG CAPS capsule Take 145 mcg by mouth daily as needed. constipation 10/14/14  Yes [provider]  Nebivolol HCl (BYSTOLIC) 20 MG TABS Take 2 tablets (40 mg total) by mouth daily. Patient taking differently: Take 40 mg by  mouth every evening.  02/23/17  Yes Chilton Si, MD  pregabalin (LYRICA) 75 MG capsule Take 75 mg by mouth 2 (two) times daily as needed (pain).   Yes [provider]  promethazine (PHENERGAN) 25 MG tablet Take 1 tablet (25 mg total) by mouth every 6 (six) hours as needed for nausea or vomiting. 02/23/17  Yes Chilton Si, MD     Physical Exam: BP (!) 148/77 (BP Location: Right Arm)   Pulse 64   Temp 97.9 F (36.6 C) (Oral)   Resp 16   Ht 5\' 9"  (1.753 m)   Wt 60 kg (132 lb 3.2 oz)   SpO2 98%   BMI 19.52 kg/m  General appearance: Well-developed, adult male, alert and in no acute distress.   Eyes: Anicteric, conjunctiva pink, lids and lashes normal. PERRL.    ENT: No nasal deformity, discharge, epistaxis.  Hearing normal. OP moist without lesions.   Dentition good. Lymph: No cervical, supraclavicular or axillary lymphadenopathy. Skin: Warm and dry.  No jaundice.  No suspicious rashes or lesions. Cardiac: RRR, nl S1-S2, no murmurs appreciated.  Capillary refill is brisk.  JVP not visible.  No LE edema.  Radial and DP pulses 2+ and symmetric.  No carotid bruits. Respiratory: Normal respiratory rate and rhythm.  CTAB without rales or wheezes. GI: Abdomen soft without rigidity.  No TTP. No ascites, distension, no hepatosplenomegaly.   MSK: No deformities or effusions. Neuro: Pupils are 4 mm and reactive to 3 mm. Extraocular movements are not coordinated, no nystagmus. Cranial nerve 5 is within normal limits. Cranial nerve 7 is symmetrical. Cranial nerve 8 is within normal limits. Cranial nerves 9 and 10 reveal equal palate elevation. Cranial nerve 11 reveals sternocleidomastoid strong. Cranial nerve 12 is midline. I do not note a deficit in motor strength testing in the upper and lower extremities bilaterally with normal motor, tone and bulk. Romberg maneuver is negative for pathology. Finger-to-nose testing is within normal limits. Speech is fluent. Naming is grossly  intact. Attention span and concentration are within normal limits.   Psych: The patient is oriented to time, place and person. Behavior appropriate.  Affect normal.  Recall, recent and remote, as well as general fund of knowledge seem within normal limits. No evidence of aural or visual hallucinations or delusions.       Labs on Admission:  I have personally reviewed following labs and imaging studies: CBC:  Recent Labs Lab 03/22/17 1455 03/22/17 1513  WBC 5.8  --   NEUTROABS 4.1  --   HGB 11.5* 12.9*  HCT 36.1* 38.0*  MCV 77.8*  --   PLT 202  --    Basic Metabolic Panel:  Recent Labs Lab 03/22/17 1455 03/22/17 1513  NA 136 137  K 5.4* 5.2*  CL 109 107  CO2 20*  --   GLUCOSE 257* 247*  BUN 25*  27*  CREATININE 3.23* 3.30*  CALCIUM 8.8*  --    GFR: Estimated Creatinine Clearance: 21.2 mL/min (A) (by C-G formula based on SCr of 3.3 mg/dL (H)). Liver Function Tests:  Recent Labs Lab 03/22/17 1455  AST 22  ALT 17  ALKPHOS 106  BILITOT 0.8  PROT 6.3*  ALBUMIN 3.5   No results for input(s): LIPASE, AMYLASE in the last 168 hours. No results for input(s): AMMONIA in the last 168 hours. Coagulation Profile:  Recent Labs Lab 03/22/17 1455  INR 1.08   Cardiac Enzymes: No results for input(s): CKTOTAL, CKMB, CKMBINDEX, TROPONINI in the last 168 hours. BNP (last 3 results) No results for input(s): PROBNP in the last 8760 hours. HbA1C:  Recent Labs  03/23/17 0559  HGBA1C 7.4*   CBG:  Recent Labs Lab 03/22/17 2306 03/23/17 0251 03/23/17 0319 03/23/17 0602  GLUCAP 100* 50* 82 299*   Lipid Profile: No results for input(s): CHOL, HDL, LDLCALC, TRIG, CHOLHDL, LDLDIRECT in the last 72 hours. Thyroid Function Tests: No results for input(s): TSH, T4TOTAL, FREET4, T3FREE, THYROIDAB in the last 72 hours. Anemia Panel: No results for input(s): VITAMINB12, FOLATE, FERRITIN, TIBC, IRON, RETICCTPCT in the last 72 hours. Sepsis Labs: Invalid input(s):  PROCALCITONIN, LACTICIDVEN No results found for this or any previous visit (from the past 240 hour(s)).    Radiological Exams on Admission: Personally reviewed CT head report: Ct Head Wo Contrast  Result Date: 03/22/2017 CLINICAL DATA:  Abnormal sensation and blurred vision today. EXAM: CT HEAD WITHOUT CONTRAST TECHNIQUE: Contiguous axial images were obtained from the base of the skull through the vertex without intravenous contrast. COMPARISON:  01/12/2015 FINDINGS: Brain: There is old infarction in the right side of the pons which was acute in June of 2016. No cerebellar abnormality is seen. Cerebral hemispheres show mild chronic small-vessel ischemic changes of the deep white matter. No cortical or large vessel territory infarction. No mass lesion, hemorrhage, hydrocephalus or extra-axial collection. Vascular: There is atherosclerotic calcification of the major vessels at the base of the brain. Skull: Normal Sinuses/Orbits: Clear/normal Other: None IMPRESSION: No acute finding by CT. 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 Mild chronic small-vessel ischemic changes of the cerebral hemispheric deep white matter. Electronically Signed   By: Paulina Fusi M.D.   On: 03/22/2017 17:13   Mr Brain Wo Contrast (neuro Protocol)  Result Date: 03/22/2017 CLINICAL DATA:  Suspicion of stroke. Altered sensation. Blurry vision. EXAM: MRI HEAD WITHOUT CONTRAST TECHNIQUE: Multiplanar, multiecho pulse sequences of the brain and surrounding structures were obtained without intravenous contrast. COMPARISON:  Head CT 03/22/2017 FINDINGS: Brain: The midline structures are normal. There is a small, linear focus of diffusion restriction within the dorsal left pons, in the region of the medial longitudinal fasciculus and paramedian pontine reticular formation. There is multifocal periventricular leukoaraiosis. There is also an old right pontine infarct. Some of these lesions are oriented perpendicularly to the long axis of the lateral  ventricles. The extent of white matter disease has markedly worsened. No intraparenchymal hematoma or chronic microhemorrhage. Mildly advanced atrophy for age. The dura is normal and there is no extra-axial collection. Vascular: Major intracranial arterial and venous sinus flow voids are preserved. Skull and upper cervical spine: The visualized skull base, calvarium, upper cervical spine and extracranial soft tissues are normal. Sinuses/Orbits: No fluid levels or advanced mucosal thickening. No mastoid or middle ear effusion. Normal orbits. IMPRESSION: 1. Acute ischemia within the left dorsal pons, in the region of the medial longitudinal fasciculus and paramedian pontine reticular  formation. Correlate for horizontal gaze palsy. 2. Marked worsening of multifocal periventricular white matter hyperintensity. It is possible that this is secondary to chronic hypertensive microangiopathy; however, the orientation of the lesions and the progression since the prior MRI a are very concerning for demyelinating disease. CSF sampling or postcontrast MR imaging, including sagittal T2/FLAIR sequence, should be considered. 3. 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 Electronically Signed   By: Deatra Robinson M.D.   On: 03/22/2017 21:54     EKG: Independently reviewed. Rate 63, QTc 462.  Echocardiogram 2016: Report reviewed EF 65% PAP 40        Assessment/Plan Active Problems:   Diabetes mellitus (HCC)   Acute CVA (cerebrovascular accident) (HCC)   Essential hypertension   CKD (chronic kidney disease), stage IV (HCC)   Microcytic anemia  1. Acute Stroke  This is new.  MRI shows small stroke. -Admit to telemetry -Neuro checks, NIHSS per protocol -Continue aspirin, Plavix -Permissive hypertension for now -Continue statin -Lipids, hemoglobin A1c -Carotid doppler, MRA or CT angiography of head and neck per Neurology, ordered -Echocardiogram ordered -PT/OT/SLP consultation -Consult to Neurology, appreciate  recommendations   2. Diabetes:  -Continue insulin pump per orderset  3. HTN:  -Permissive hypertension for now, hold amlodipine, Bystolic  4. Anemia:  -Continue iron  5. Chronic back pain:  -Continue Butrans  6. Other medications:  -Continue Linzess, pregabalin      DVT prophylaxis: Lovenox  Code Status: FULL  Family Communication: None present  Disposition Plan: Anticipate Stroke work up as above and consult to ancillary services.  Expect discharge within 2 days. Consults called: Neurology, Dr. Otelia Limes has seen patient. Admission status: Telemetry, INPATIENT status  Core measures: -VTE prophylaxis ordered at time of admission -Aspirin ordered at admission -Atrial fibrillation: not present -tPA not given because of outside stroke window -Dysphagia screen ordered in ER -Lipids ordered -PT eval ordered -Smoking cessation recommended    Medical decision making: Patient seen at 12:15 AM on 03/23/2017.  The patient was discussed with Dr. Otelia Limes, Michela Pitcher. What exists of the patient's chart was reviewed in depth and summarized above.  Clinical condition: stable.       Alberteen Sam [facility] Pager [phone]

## 2017-03-23 NOTE — Evaluation (Signed)
Physical Therapy Evaluation and Discharge Patient Details Name: Daniel Santos MRN: [id number] DOB: 10-22-1960 Today's Date: 03/23/2017   History of Present Illness  Pt is a 56 y/o male admitted secondary to visual changes. Pt found to have an acute dorsal pontine ischemic infarct. PMH including but not limited to DM, HTN, CKD and hx of CVA.  Clinical Impression  Pt presented supine in bed with HOB elevated, awake and willing to participate in therapy session. Prior to admission, pt reported that he was independent with all functional mobility and ADLs. Pt reported that he continues to work full-time. Pt ambulated within his room and in hallway with supervision without use of an AD. No instability or LOB with gait. Pt reported that he is at his functional baseline. No further acute PT needs identified at this time. PT signing off.    Follow Up Recommendations No PT follow up    Equipment Recommendations  None recommended by PT    Recommendations for Other Services       Precautions / Restrictions Precautions Precautions: None Restrictions Weight Bearing Restrictions: No      Mobility  Bed Mobility Overal bed mobility: Independent                Transfers Overall transfer level: Independent Equipment used: None                Ambulation/Gait Ambulation/Gait assistance: Supervision Ambulation Distance (Feet): 200 Feet Assistive device: None Gait Pattern/deviations: Step-through pattern;WFL(Within Functional Limits) Gait velocity: WFL Gait velocity interpretation: at or above normal speed for age/gender General Gait Details: no instability or LOB, supervision for safety  Stairs            Wheelchair Mobility    Modified Rankin (Stroke Patients Only) Modified Rankin (Stroke Patients Only) Pre-Morbid Rankin Score: No symptoms Modified Rankin: No significant disability     Balance Overall balance assessment: No apparent balance deficits (not formally  assessed)                                           Pertinent Vitals/Pain Pain Assessment: No/denies pain    Home Living Family/patient expects to be discharged to:: Private residence Living Arrangements: Spouse/significant other Available Help at Discharge: Family;Friend(s) Type of Home: Apartment Home Access: Stairs to enter   [employer] of Steps: 3 Home Layout: One level Home Equipment: Walker - 2 wheels;Cane - single point;Bedside commode      Prior Function Level of Independence: Independent               Hand Dominance        Extremity/Trunk Assessment   Upper Extremity Assessment Upper Extremity Assessment: Overall WFL for tasks assessed    Lower Extremity Assessment Lower Extremity Assessment: Overall WFL for tasks assessed       Communication   Communication: No difficulties  Cognition Arousal/Alertness: Awake/alert Behavior During Therapy: WFL for tasks assessed/performed Overall Cognitive Status: Within Functional Limits for tasks assessed                                        General Comments      Exercises     Assessment/Plan    PT Assessment Patent does not need any further PT services  PT Problem List  PT Treatment Interventions      PT Goals (Current goals can be found in the Care Plan section)  Acute Rehab PT Goals Patient Stated Goal: return home    Frequency     Barriers to discharge        Co-evaluation               AM-PAC PT "6 Clicks" Daily Activity  Outcome Measure Difficulty turning over in bed (including adjusting bedclothes, sheets and blankets)?: None Difficulty moving from lying on back to sitting on the side of the bed? : None Difficulty sitting down on and standing up from a chair with arms (e.g., wheelchair, bedside commode, etc,.)?: None Help needed moving to and from a bed to chair (including a wheelchair)?: None Help needed walking in  hospital room?: None Help needed climbing 3-5 steps with a railing? : None 6 Click Score: 24    End of Session   Activity Tolerance: Patient tolerated treatment well Patient left: in bed;with call bell/phone within reach;with bed alarm set Nurse Communication: Mobility status PT Visit Diagnosis: Other symptoms and signs involving the nervous system (H63.149)    Time: 7026-3785 PT Time Calculation (min) (ACUTE ONLY): 18 min   Charges:   PT Evaluation $PT Eval Moderate Complexity: 1 Mod     PT G Codes:        [place], PT, DPT [phone]   Alessandra Bevels Maddisyn Hegwood 03/23/2017, 2:46 PM

## 2017-03-23 NOTE — Progress Notes (Signed)
Inpatient Diabetes Program Recommendations  AACE/ADA: New Consensus Statement on Inpatient Glycemic Control (2015)  Target Ranges:  Prepandial:   less than 140 mg/dL      Peak postprandial:   less than 180 mg/dL (1-2 hours)      Critically ill patients:  140 - 180 mg/dL   Lab Results  Component Value Date   GLUCAP 424 (H) 03/23/2017   HGBA1C 7.4 (H) 03/23/2017    Review of Glycemic Control  Inpatient Diabetes Program Recommendations:  Spoke with patient, wife and RN @ bedside. Patient's wife brought insulin pump supplies to change out injection site as well as replace sensor. Patient's insulin pump is a 670 pump and patient took off his sensor when going for testing. Patient's insulin was sent to pharmacy on admission and RN called for insulin to be sent back to patient's room. Patient plans to change insertion site for insulin administration and reinsert sensor. Reviewed plans with RN. Will follow.  Thank you, [name]. Geneve Kimpel, RN, MSN, CDE  Diabetes Coordinator Inpatient Glycemic Control Team Team Pager [phone] (8am-5pm) 03/23/2017 2:35 PM

## 2017-03-23 NOTE — Progress Notes (Signed)
Arrived from [facility] at 0220. Alert and oriented. Denies any pain. Call light within reach.

## 2017-03-23 NOTE — Progress Notes (Addendum)
SLP Cancellation Note  Patient Details Name: Daniel Santos MRN: [id number] DOB: 1960/12/21   Cancelled treatment:       Reason Eval/Treat Not Completed: SLP screened, no needs identified, will sign off. Pt was encouraged to notify MD if he notices changes in com/cog status once he returns to her daily routine, as home health or outpatient therapy is available if needs arise.   Cliford Sequeira B. [place], [facility], CCC-SLP [phone]  Leigh Aurora 03/23/2017, 10:22 AM

## 2017-03-23 NOTE — Care Management Note (Signed)
Case Management Note  Patient Details  Name: COURY SCHOEPP MRN: [id number] Date of Birth: 1961/05/27  Subjective/Objective:                Patient from home w spouse, admitted w CVA. No PT f/u recommended, currently on Plavix and ASA. No CM needs identified at this time.    Action/Plan:  CM will continue to follow.  Expected Discharge Date:                  Expected Discharge Plan:  Home/Self Care  In-House Referral:     Discharge planning Services  CM Consult  Post Acute Care Choice:    Choice offered to:     DME Arranged:    DME Agency:     HH Arranged:    HH Agency:     Status of Service:  In process, will continue to follow  If discussed at Long Length of Stay Meetings, dates discussed:    Additional Comments:  Lawerance Sabal, RN 03/23/2017, 3:05 PM

## 2017-03-23 NOTE — Progress Notes (Signed)
Hypoglycemic Event  CBG:50  Treatment: 4oz orange juice  Symptoms: asymptomatic  Follow-up CBG: Time:0310 CBG Result:82  Possible Reasons for Event: NPO  Comments/MD notified: Dr. Demetra Shiner

## 2017-03-23 NOTE — Progress Notes (Signed)
*  PRELIMINARY RESULTS* Vascular Ultrasound Carotid Duplex (Doppler) has been completed.  Preliminary findings: Bilateral 1-39% ICA stenosis, antegrade vertebral flow.   Chauncey Fischer 03/23/2017, 11:59 AM

## 2017-03-23 NOTE — Progress Notes (Addendum)
STROKE TEAM PROGRESS NOTE   HISTORY OF PRESENT ILLNESS (per record) Daniel Santos is an 56 y.o. male who presented to the ED for "delayed focusing" following eye movement. Symptoms began on 8/16 at about 0900 when he first noticed that he "felt off". He was using his computer and then began to feel worse with "cloudy" vision. When he gazed in different directions his vision would be blurry, but after a lag he could focus again. He had a TIA two years ago with similar symptoms.   He denies headache, confusion, difficulty speaking, limb weakness, limb numbness, chest pain, abdominal pain or limb pain.   MRI brain reveals an acute subcentimeter ischemic infarction within the left dorsal pons, in the region of the medial longitudinal fasciculus and paramedian pontine reticular formation. 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   PMHx includes atypical chest pain, HTN, hypercholesterolemia and DM.   Home medications include ASA, Plavix and Lipitor.   SUBJECTIVE (INTERVAL HISTORY) No acute events overnight. Pt says that he feels like his symptoms are similar to two years ago. Feels like his focus is slow but denies any diplopia or blurry vision. Cardiology changed from carvedilol to bystolic as thought coreg interfering with appetite. He still smokes, not checking BP and glucose at home.    OBJECTIVE Temp:  [97.8 F (36.6 C)-98.4 F (36.9 C)] 97.9 F (36.6 C) (08/17 0621) Pulse Rate:  [59-71] 64 (08/17 0621) Cardiac Rhythm: Normal sinus rhythm (08/17 0756) Resp:  [10-21] 16 (08/17 0621) BP: (127-151)/(68-97) 148/77 (08/17 0621) SpO2:  [97 %-100 %] 98 % (08/17 0621) Weight:  [60 kg (132 lb 3.2 oz)] 60 kg (132 lb 3.2 oz) (08/17 0226)  CBC:  Recent Labs Lab 03/22/17 1455  03/22/17 1513  WBC 5.8  --   NEUTROABS 4.1  --   HGB 11.5* 12.9*  HCT 36.1* 38.0*  MCV 77.8*  --   PLT 202  --     Basic Metabolic Panel:  Recent Labs Lab 03/22/17 1455 03/22/17 1513  NA 136 137  K 5.4* 5.2*  CL 109 107  CO2 20*  --   GLUCOSE 257* 247*  BUN 25* 27*  CREATININE 3.23* 3.30*  CALCIUM 8.8*  --     Lipid Panel:    Component Value Date/Time   CHOL 98 03/23/2017 0559   TRIG 105 03/23/2017 0559   HDL 40 (L) 03/23/2017 0559   CHOLHDL 2.5 03/23/2017 0559   VLDL 21 03/23/2017 0559   LDLCALC 37 03/23/2017 0559   HgbA1c:  Lab Results  Component Value Date   HGBA1C 7.4 (H) 03/23/2017   Urine Drug Screen:    Component Value Date/Time   LABOPIA NONE DETECTED 01/13/2015 0020   COCAINSCRNUR NONE DETECTED 01/13/2015 0020   LABBENZ NONE DETECTED 01/13/2015 0020   AMPHETMU NONE DETECTED 01/13/2015 0020   THCU NONE DETECTED 01/13/2015 0020   LABBARB NONE DETECTED 01/13/2015 0020    Alcohol Level No results found for: ETH  IMAGING I have personally reviewed the radiological images below and agree with the radiology interpretations.  Ct Head Wo Contrast 03/22/2017 IMPRESSION: No acute finding by CT. 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 Mild chronic small-vessel ischemic changes of the cerebral hemispheric deep white matter.  Mr Brain Wo Contrast (neuro Protocol) 03/22/2017 IMPRESSION: 1. Acute ischemia within the left dorsal pons, in the region of  the medial longitudinal fasciculus and paramedian pontine reticular formation. Correlate for horizontal gaze palsy. 2. Marked worsening of multifocal periventricular white matter hyperintensity. It is possible that this is secondary to chronic hypertensive microangiopathy; however, the orientation of the lesions and the progression since the prior MRI a are very concerning for demyelinating disease. CSF sampling or postcontrast MR imaging, including sagittal T2/FLAIR sequence, should be considered. 3. 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    CUS - Bilateral 1-39% ICA stenosis, antegrade vertebral flow.  MRA cancelled - not compatible with insulin pump and pt does not want to take out again.   2-D Echo pending   PHYSICAL EXAM  Temp:  [97.8 F (36.6 C)-98.4 F (36.9 C)] 98 F (36.7 C) (08/17 0800) Pulse Rate:  [59-78] 67 (08/17 1000) Resp:  [10-21] 17 (08/17 1000) BP: (126-151)/(67-97) 138/67 (08/17 1000) SpO2:  [97 %-100 %] 100 % (08/17 1000) Weight:  [132 lb 3.2 oz (60 kg)] 132 lb 3.2 oz (60 kg) (08/17 0226)  General - Well nourished, well developed, in no apparent distress.  Ophthalmologic - Sharp disc margins OU.   Cardiovascular - Regular rate and rhythm.  Mental Status -  Level of arousal and orientation to time, place, and person were intact. Language including expression, naming, repetition, comprehension was assessed and found intact. Fund of Knowledge was assessed and was intact.  Cranial Nerves II - XII - II - Visual field intact OU. III, IV, VI - Extraocular movements intact. V - Facial sensation intact bilaterally. VII - mild left nasolabial fold flattening. VIII - Hearing & vestibular intact bilaterally. X - Palate elevates symmetrically. XI - Chin turning & shoulder shrug intact bilaterally. XII - Tongue protrusion intact.  Motor Strength - The patient's strength was normal in all extremities and pronator drift was absent.  Bulk was normal and fasciculations were absent.   Motor Tone - Muscle tone was assessed at the neck and appendages and was normal.  Reflexes - The patient's reflexes were 1+ in all extremities and he had no pathological reflexes.  Sensory - Light touch, temperature/pinprick, vibration and proprioception were assessed and were symmetrical.    Coordination - The patient had normal movements in the hands with no ataxia or dysmetria.  Tremor was absent.  Gait and Station - deferred.   ASSESSMENT/PLAN Daniel Santos is a 56 y.o. male with history of DM, HTN, history  of CVA, CKD4 presenting with vision changes. He did not receive IV t-PA due to out of window.   Stroke: Acute punctate infarct at the left dorsal pons, just underneath the old pontine infarct 2 years ago, likely due to small vessel disease.   Resultant - slow visual focusing  CT head No acute finding. 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   MRI head: Acute ischemia within the left dorsal pons, in the region of the medial longitudinal fasciculus and paramedian pontine reticular formation. 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  MRA head - not compatible with insulin pump and pt does not want to take out again - cancelled felt will not alter medical management  Carotid Doppler  unremarkable  2D Echo  pending  LDL 37  HgbA1c 7.4  SCDs for VTE prophylaxis  Diet heart healthy/carb modified Room service appropriate? Yes; Fluid consistency: Thin  aspirin 81 mg daily and plavix prior to admission, now on aspirin 325 mg daily and plavix. Continue DAPT on discharge.   Patient counseled to be compliant with his antithrombotic medications  Ongoing aggressive stroke risk factor management  Therapy recommendations:  pending  Disposition:  Pending  Hx of stroke  01/2015 - left sided weakness, difficulty with gait - MRI showed right pontine infarct - MRA/carotid Doppler/2-D echo unremarkable - LDL 39, A1c 8.5 - aspirin switched to Plavix  Follow with Dr. Anne Hahn in clinic  Hypertension  Stable  Permissive hypertension (OK if < 220/120) but gradually normalize in 5-7 days  Long-term BP goal normotensive  Hyperlipidemia  Home meds:  atorvastatin 20,  Same resumed in hospital  LDL 37 , goal < 70  Continue statin at discharge  Diabetes  HgbA1c 7.4, goal < 7.0  Uncontrolled but improved from prior  On insulin pump  Close follow-up with PCP  Tobacco abuse  Current smoker  Smoking cessation counseling provided  Pt is willing to quit  Other Stroke Risk Factors  Advanced age  Other  Active Problems  CKD stage 4   Follows with Dr. Anne Hahn in clinic at [facility] day # 1  Neurology will sign off. Please call with questions. Pt will follow up with Dr. Anne Hahn at [facility] in about 6 weeks. Thanks for the consult.   Marvel Plan, MD PhD Stroke Neurology 03/23/2017 1:53 PM   To contact Stroke Continuity provider, please refer to [email or web address]. After hours, contact General Neurology

## 2017-03-23 NOTE — Progress Notes (Signed)
Patient sent his home medications to the main pharmacy on arrival last night; he wants his medications dispenced from his own bottles; pharmacy policy is only for nonformulary medications; formulary medication must be used from hospital supply; he has both formulary and nonformulary medications; safety reviewed as well. Patient refusing ASA, Plavix, Iron, And cholestrol medication from pharmacy supply this evening; pharmacy has arrived to speak to patient reguarding policies and MD will be notified of missed doses.

## 2017-03-23 NOTE — Progress Notes (Signed)
[facility]                                                                              Patient Demographics  Daniel Santos, is a 56 y.o. male, DOB - 03-Sep-1960, ONG:[id number]  Admit date - 03/22/2017   Admitting Physician Alberteen Sam, MD  Outpatient Primary MD for the patient is Starkes, Juel Burrow, FNP  Outpatient specialists:   LOS - 1  days   Medical records reviewed and are as summarized below:    Chief Complaint  Patient presents with  . Fatigue    "Feels Off"       Brief summary  Patient is a 57 year old male with insulin-dependent diabetes, hypertension history of prior CVA, CAD stage IV presented with visual changes. Patient reported he was in his usual state of health when he got home from his third shift and fell off. He tried to get some sleep and continued to feel cloudy. He complained of difficulty focusing, persisted all day hence he came to the ER. Otherwise no slurred speech or focal weakness. MRI showed new dorsal pontine infarct.   Assessment & Plan    Principal Problem:   Acute CVA (cerebrovascular accident) (HCC)Presented with visual changes -MRI of the brain showed acute ischemia in the left dorsal pons, in the region of medial longitudinal fasciculus and paramedian pontine reticular formation, marked worsening of the multifocal periventricular white matter hyperintensity.  - MRA pending - 2-D echo pending - Carotid Doppler showed 1-39% ICA stenosis - Continue aspirin 325 mg daily, Plavix 75 mg daily, Lipitor 20 mg daily - LDL 37, hemoglobin A1c 7.4 - PTOT eval pending  Active Problems:   Diabetes mellitus (HCC), uncontrolled with hyperglycemia -Hemoglobin A1c 7.4 - CBGs in 200s, uncontrolled, has insulin pump, will place diabetic coordinator consult     Essential hypertension - Permissive hypertension for now, hold amlodipine, Bystolic    CKD (chronic kidney disease), stage IV (HCC) - Baseline creatinine appears to be  2.9-3.1, currently at 3.3 close to baseline, place on gentle hydration    Microcytic anemia -H&H currently stable   Code Status: *Full CODE STATUS DVT Prophylaxis:  Lovenox  Family Communication: Discussed in detail with the patient, all imaging results, lab results explained to the patient   Disposition Plan:   Time Spent in minutes 25 minutes  Procedures:   Consultants:   neuro  Antimicrobials:      Medications  Scheduled Meds: . aspirin EC  325 mg Oral QPM  . atorvastatin  20 mg Oral QPM  . [START ON 03/25/2017] buprenorphine  20 mcg Transdermal Q Sun  . calcitRIOL  0.25 mcg Oral Q M,W,F  . clopidogrel  75 mg Oral QPM  . enoxaparin (LOVENOX) injection  30 mg Subcutaneous Q24H  . ferrous sulfate  325 mg Oral QPM  . insulin pump   Subcutaneous TID AC, HS, 0200   Continuous Infusions: PRN Meds:.acetaminophen **OR** acetaminophen (TYLENOL) oral liquid 160 mg/5 mL **OR** acetaminophen, pregabalin, senna-docusate   Antibiotics   Anti-infectives    None        Subjective:   Jodi Geralds was seen and examined  today. He denies any specific complaints, states vision is still the same or doesn't know if it's better.he hasn't gotten up yet from the bed. Eating breakfast without any difficulty. No choking or aspiration.  Patient denies dizziness, chest pain, shortness of breath, abdominal pain, N/V/D/C. No acute events overnight.    Objective:   Vitals:   03/23/17 0430 03/23/17 0621 03/23/17 0800 03/23/17 1000  BP: 127/68 (!) 148/77 126/83 138/67  Pulse: 70 64 78 67  Resp: 16 16 18 17   Temp:  97.9 F (36.6 C) 98 F (36.7 C)   TempSrc:  Oral    SpO2: 97% 98% 100% 100%  Weight:      Height:       No intake or output data in the 24 hours ending 03/23/17 1218   Wt Readings from Last 3 Encounters:  03/23/17 60 kg (132 lb 3.2 oz)  02/23/17 59.5 kg (131 lb 3.2 oz)  01/06/16 58.1 kg (128 lb)     Exam  General: Alert and oriented x 3, NAD  Eyes: PERRLA,  EOMI, Anicteric Sclera,  HEENT:  Atraumatic, normocephalic, normal oropharynx  Cardiovascular: S1 S2 auscultated, no rubs, murmurs or gallops. Regular rate and rhythm.  Respiratory: Clear to auscultation bilaterally, no wheezing, rales or rhonchi  Gastrointestinal: Soft, nontender, nondistended, + bowel sounds  Ext: no pedal edema bilaterally  Neuro: AAOx3, Strength 5/5 upper and lower extremities bilaterally, speech clear, sensations grossly intact  Musculoskeletal: No digital cyanosis, clubbing  Skin: No rashes  Psych: Normal affect and demeanor, alert and oriented x3    Data Reviewed:  I have personally reviewed following labs and imaging studies  Micro Results No results found for this or any previous visit (from the past 240 hour(s)).  Radiology Reports Ct Head Wo Contrast  Result Date: 03/22/2017 CLINICAL DATA:  Abnormal sensation and blurred vision today. EXAM: CT HEAD WITHOUT CONTRAST TECHNIQUE: Contiguous axial images were obtained from the base of the skull through the vertex without intravenous contrast. COMPARISON:  01/12/2015 FINDINGS: Brain: There is old infarction in the right side of the pons which was acute in June of 2016. No cerebellar abnormality is seen. Cerebral hemispheres show mild chronic small-vessel ischemic changes of the deep white matter. No cortical or large vessel territory infarction. No mass lesion, hemorrhage, hydrocephalus or extra-axial collection. Vascular: There is atherosclerotic calcification of the major vessels at the base of the brain. Skull: Normal Sinuses/Orbits: Clear/normal Other: None IMPRESSION: No acute finding by CT. 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 Mild chronic small-vessel ischemic changes of the cerebral hemispheric deep white matter. Electronically Signed   By: Paulina Fusi M.D.   On: 03/22/2017 17:13   Mr Brain Wo Contrast (neuro Protocol)  Result Date: 03/22/2017 CLINICAL DATA:  Suspicion of stroke. Altered sensation. Blurry  vision. EXAM: MRI HEAD WITHOUT CONTRAST TECHNIQUE: Multiplanar, multiecho pulse sequences of the brain and surrounding structures were obtained without intravenous contrast. COMPARISON:  Head CT 03/22/2017 FINDINGS: Brain: The midline structures are normal. There is a small, linear focus of diffusion restriction within the dorsal left pons, in the region of the medial longitudinal fasciculus and paramedian pontine reticular formation. There is multifocal periventricular leukoaraiosis. There is also an old right pontine infarct. Some of these lesions are oriented perpendicularly to the long axis of the lateral ventricles. The extent of white matter disease has markedly worsened. No intraparenchymal hematoma or chronic microhemorrhage. Mildly advanced atrophy for age. The dura is normal and there is no extra-axial collection. Vascular: Major intracranial arterial  and venous sinus flow voids are preserved. Skull and upper cervical spine: The visualized skull base, calvarium, upper cervical spine and extracranial soft tissues are normal. Sinuses/Orbits: No fluid levels or advanced mucosal thickening. No mastoid or middle ear effusion. Normal orbits. IMPRESSION: 1. Acute ischemia within the left dorsal pons, in the region of the medial longitudinal fasciculus and paramedian pontine reticular formation. Correlate for horizontal gaze palsy. 2. Marked worsening of multifocal periventricular white matter hyperintensity. It is possible that this is secondary to chronic hypertensive microangiopathy; however, the orientation of the lesions and the progression since the prior MRI a are very concerning for demyelinating disease. CSF sampling or postcontrast MR imaging, including sagittal T2/FLAIR sequence, should be considered. 3. 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 Electronically Signed   By: Deatra Robinson M.D.   On: 03/22/2017 21:54    Lab Data:  CBC:  Recent Labs Lab 03/22/17 1455 03/22/17 1513  WBC 5.8  --   NEUTROABS 4.1   --   HGB 11.5* 12.9*  HCT 36.1* 38.0*  MCV 77.8*  --   PLT 202  --    Basic Metabolic Panel:  Recent Labs Lab 03/22/17 1455 03/22/17 1513  NA 136 137  K 5.4* 5.2*  CL 109 107  CO2 20*  --   GLUCOSE 257* 247*  BUN 25* 27*  CREATININE 3.23* 3.30*  CALCIUM 8.8*  --    GFR: Estimated Creatinine Clearance: 21.2 mL/min (A) (by C-G formula based on SCr of 3.3 mg/dL (H)). Liver Function Tests:  Recent Labs Lab 03/22/17 1455  AST 22  ALT 17  ALKPHOS 106  BILITOT 0.8  PROT 6.3*  ALBUMIN 3.5   No results for input(s): LIPASE, AMYLASE in the last 168 hours. No results for input(s): AMMONIA in the last 168 hours. Coagulation Profile:  Recent Labs Lab 03/22/17 1455  INR 1.08   Cardiac Enzymes: No results for input(s): CKTOTAL, CKMB, CKMBINDEX, TROPONINI in the last 168 hours. BNP (last 3 results) No results for input(s): PROBNP in the last 8760 hours. HbA1C:  Recent Labs  03/23/17 0559  HGBA1C 7.4*   CBG:  Recent Labs Lab 03/22/17 2306 03/23/17 0251 03/23/17 0319 03/23/17 0602 03/23/17 1208  GLUCAP 100* 50* 82 299* 424*   Lipid Profile:  Recent Labs  03/23/17 0559  CHOL 98  HDL 40*  LDLCALC 37  TRIG 119  CHOLHDL 2.5   Thyroid Function Tests: No results for input(s): TSH, T4TOTAL, FREET4, T3FREE, THYROIDAB in the last 72 hours. Anemia Panel: No results for input(s): VITAMINB12, FOLATE, FERRITIN, TIBC, IRON, RETICCTPCT in the last 72 hours. Urine analysis:    Component Value Date/Time   COLORURINE YELLOW 11/17/2008 0839   APPEARANCEUR CLEAR 11/17/2008 0839   LABSPEC 1.017 11/17/2008 0839   PHURINE 5.5 11/17/2008 0839   GLUCOSEU 250 (A) 11/17/2008 0839   HGBUR TRACE (A) 11/17/2008 0839   BILIRUBINUR NEGATIVE 11/17/2008 0839   KETONESUR NEGATIVE 11/17/2008 0839   PROTEINUR 100 (A) 11/17/2008 0839   UROBILINOGEN 0.2 11/17/2008 0839   NITRITE NEGATIVE 11/17/2008 0839   LEUKOCYTESUR NEGATIVE 11/17/2008 0839     Rexford Prevo M.D. [facility] 03/23/2017, 12:18 PM  Pager: [phone] Between 7am to 7pm - call Pager - [phone]  After 7pm go to [email or web address] - password [email or web address]  Call night coverage person covering after 7pm

## 2017-03-24 DIAGNOSIS — Z720 Tobacco use: Secondary | ICD-10-CM

## 2017-03-24 DIAGNOSIS — Z8673 Personal history of transient ischemic attack (TIA), and cerebral infarction without residual deficits: Secondary | ICD-10-CM

## 2017-03-24 LAB — GLUCOSE, CAPILLARY
GLUCOSE-CAPILLARY: 65 mg/dL (ref 65–99)
Glucose-Capillary: 189 mg/dL — ABNORMAL HIGH (ref 65–99)
Glucose-Capillary: 164 mg/dL — ABNORMAL HIGH (ref 65–99)

## 2017-03-24 MED ORDER — ASPIRIN 325 MG PO TBEC: 325.0000 mg | DELAYED_RELEASE_TABLET | Freq: Every day | ORAL | 4 refills | Status: DC

## 2017-03-24 MED ORDER — CARVEDILOL 25 MG PO TABS: 25.0000 mg | ORAL_TABLET | Freq: Two times a day (BID) | ORAL | 11 refills | Status: DC

## 2017-03-24 NOTE — Progress Notes (Signed)
CBG 65 @0630  grape juice given, patient agreeable / cooperative.  Will recheck CBG prior to 0730 bfast.

## 2017-03-24 NOTE — Evaluation (Signed)
Occupational Therapy Evaluation and Discharge  Patient Details Name: Daniel Santos MRN: [id number] DOB: 12-Dec-1960 Today's Date: 03/24/2017    History of Present Illness Pt is a 56 y/o male admitted secondary to visual changes. Pt found to have an acute dorsal pontine ischemic infarct. PMH including but not limited to DM, HTN, CKD and hx of CVA.   Clinical Impression   Pt reports he was independent with ADL PTA. Currently pt overall mod I-independent with ADL and functional mobility. Pt reports visual changes have resolved and he is currently at his functional baseline. Pt planning to d/c home with supervision from family. No further acute OT needs identified; signing off at this time. Please re-consult if needs change. Thank you for this referral.    Follow Up Recommendations  No OT follow up;Supervision - Intermittent    Equipment Recommendations  None recommended by OT    Recommendations for Other Services       Precautions / Restrictions Precautions Precautions: None Restrictions Weight Bearing Restrictions: No      Mobility Bed Mobility Overal bed mobility: Independent                Transfers Overall transfer level: Independent Equipment used: None                  Balance Overall balance assessment: No apparent balance deficits (not formally assessed)                                         ADL either performed or assessed with clinical judgement   ADL Overall ADL's : Modified independent                                       General ADL Comments: No balance deficits noted with functional activities.     Vision Patient Visual Report: No change from baseline Vision Assessment?: No apparent visual deficits Additional Comments: Pt reports visual deficits have resolved; basic visual testing [facility]     Perception     Praxis      Pertinent Vitals/Pain Pain Assessment: No/denies pain     Hand Dominance Right   Extremity/Trunk Assessment Upper Extremity Assessment Upper Extremity Assessment: Overall WFL for tasks assessed   Lower Extremity Assessment Lower Extremity Assessment: Defer to PT evaluation   Cervical / Trunk Assessment Cervical / Trunk Assessment: Normal   Communication Communication Communication: No difficulties   Cognition Arousal/Alertness: Awake/alert Behavior During Therapy: WFL for tasks assessed/performed Overall Cognitive Status: Within Functional Limits for tasks assessed                                     General Comments       Exercises     Shoulder Instructions      Home Living Family/patient expects to be discharged to:: Private residence Living Arrangements: Spouse/significant other Available Help at Discharge: Family;Friend(s) Type of Home: Apartment Home Access: Stairs to enter [employer] of Steps: 3   Home Layout: One level     Bathroom Shower/Tub: Chief Strategy Officer: Standard     Home Equipment: Environmental consultant - 2 wheels;Cane - single point;Bedside commode          Prior Functioning/Environment Level  of Independence: Independent                 OT Problem List:        OT Treatment/Interventions:      OT Goals(Current goals can be found in the care plan section) Acute Rehab OT Goals Patient Stated Goal: return home OT Goal Formulation: All assessment and education complete, DC therapy  OT Frequency:     Barriers to D/C:            Co-evaluation              AM-PAC PT "6 Clicks" Daily Activity     Outcome Measure Help from another person eating meals?: None Help from another person taking care of personal grooming?: None Help from another person toileting, which includes using toliet, bedpan, or urinal?: None Help from another person bathing (including washing, rinsing, drying)?: None Help from another person to put on and taking off regular upper body clothing?: None Help  from another person to put on and taking off regular lower body clothing?: None 6 Click Score: 24   End of Session    Activity Tolerance: Patient tolerated treatment well Patient left: in bed;with call bell/phone within reach  OT Visit Diagnosis: Low vision, both eyes (H54.2)                Time: 0240-9735 OT Time Calculation (min): 8 min Charges:  OT General Charges $OT Visit: 1 Procedure OT Evaluation $OT Eval Low Complexity: 1 Procedure G-Codes:     Laiklynn Raczynski A. Brett Albino, M.S., OTR/L Pager: [phone]  Gaye Alken 03/24/2017, 11:23 AM

## 2017-03-24 NOTE — Discharge Summary (Signed)
Physician Discharge Summary   Patient ID: Daniel Santos MRN: [id number] DOB/AGE: 09/22/1960 56 y.o.  Admit date: 03/22/2017 Discharge date: 03/24/2017  Primary Care Physician:  Truman Hayward, FNP  Discharge Diagnoses:    . Left pontine cerebrovascular accident (HCC) . Essential hypertension . CKD (chronic kidney disease), stage IV (HCC) . Microcytic anemia   Consults: Neurology  Recommendations for Outpatient Follow-up:  1. Please repeat CBC/BMET at next visit 2. Please note, patient requested me to discontinue his bystolic and give him the prescription for Coreg. I reviewed his prior notes from Dr. Chilton Si his cardiologist. He used to be on Coreg 25 mg twice a day, I put him back on Coreg per patient's preference. I requested him to follow-up up with Dr. Duke Salvia   DIET: Heart healthy diet    Allergies:   Allergies  Allergen Reactions  . Doxycycline Nausea Only    (kidney function?)     DISCHARGE MEDICATIONS: Current Discharge Medication List    START taking these medications   Details  carvedilol (COREG) 25 MG tablet Take 1 tablet (25 mg total) by mouth 2 (two) times daily with a meal. Qty: 60 tablet, Refills: 11      CONTINUE these medications which have CHANGED   Details  aspirin EC 325 MG EC tablet Take 1 tablet (325 mg total) by mouth daily. Qty: 30 tablet, Refills: 4      CONTINUE these medications which have NOT CHANGED   Details  amLODipine (NORVASC) 10 MG tablet Take 10 mg by mouth every evening.     atorvastatin (LIPITOR) 20 MG tablet Take 20 mg by mouth every evening.     BUTRANS 20 MCG/HR PTWK patch Place 1 patch onto the skin once a week.    calcitRIOL (ROCALTROL) 0.25 MCG capsule Take 0.25 mcg by mouth every other day. Monday, Wednesday, Friday    clopidogrel (PLAVIX) 75 MG tablet Take 1 tablet (75 mg total) by mouth daily. Qty: 30 tablet, Refills: 0    ferrous sulfate 325 (65 FE) MG tablet Take 325 mg by mouth every evening.      HUMALOG 100 UNIT/ML injection Inject into the skin.     Insulin Human (INSULIN PUMP) SOLN Inject into the skin. Humulog Insulin Pump    LINZESS 145 MCG CAPS capsule Take 145 mcg by mouth daily as needed. constipation    pregabalin (LYRICA) 75 MG capsule Take 75 mg by mouth 2 (two) times daily as needed (pain).    promethazine (PHENERGAN) 25 MG tablet Take 1 tablet (25 mg total) by mouth every 6 (six) hours as needed for nausea or vomiting. Qty: 30 tablet, Refills: 0      STOP taking these medications     Nebivolol HCl (BYSTOLIC) 20 MG TABS          Brief H and P: For complete details please refer to admission H and P, but in brief Patient is a 56 year old male with insulin-dependent diabetes, hypertension history of prior CVA, CAD stage IV presented with visual changes. Patient reported he was in his usual state of health when he got home from his third shift and fell off. He tried to get some sleep and continued to feel cloudy. He complained of difficulty focusing, persisted all day hence he came to the ER. Otherwise no slurred speech or focal weakness. MRI showed new dorsal pontine infarct.   Hospital Course:   Acute CVA (cerebrovascular accident) (HCC)Presented with visual changes -MRI of the brain showed acute  ischemia in the left dorsal pons, in the region of medial longitudinal fasciculus and paramedian pontine reticular formation, marked worsening of the multifocal periventricular white matter hyperintensity.  - MRA head was canceled as it is not compatible with insulin pump and patient does not want to take out insulin pump. Neurology felt that it will not alter the medical management hence it was canceled. - 2-D echo showed EF of 60-65%, no regional wall motion abnormalities. - Carotid Doppler showed 1-39% ICA stenosis - Continue aspirin 325 mg daily, Plavix 75 mg daily, Lipitor 20 mg daily - LDL 37, hemoglobin A1c 7.4 - PTOT eval recommended no PTOT follow-up, back to  baseline     Diabetes mellitus (HCC), uncontrolled with hyperglycemia -Hemoglobin A1c 7.4 - Patient has insulin pump, he will resume his pump      Essential hypertension - In patient, his antihypertensives were held. At the time of discharge patient requested that he does not want to be on bystolic. He wants to be back on Coreg that he used to be on as he felt that his BP was much more controlled on Coreg. I reviewed Dr. Leonides Sake notes. I put him back on Coreg 25 mg twice a day and discontinued by bystolic.    CKD (chronic kidney disease), stage IV (HCC) - Baseline creatinine appears to be 2.9-3.1, currently at 3.3 close to baseline, he was placed on gentle hydration    Microcytic anemia -H&H currently stable   Day of Discharge BP (!) 148/82 (BP Location: Left Arm)   Pulse (!) 58   Temp 98.8 F (37.1 C) (Oral)   Resp 20   Ht 5\' 9"  (1.753 m)   Wt 60 kg (132 lb 3.2 oz)   SpO2 100%   BMI 19.52 kg/m   Physical Exam: General: Alert and awake oriented x3 not in any acute distress. HEENT: anicteric sclera, pupils reactive to light and accommodation CVS: S1-S2 clear no murmur rubs or gallops Chest: clear to auscultation bilaterally, no wheezing rales or rhonchi Abdomen: soft nontender, nondistended, normal bowel sounds Extremities: no cyanosis, clubbing or edema noted bilaterally Neuro: Cranial nerves II-XII intact, no focal neurological deficits   The results of significant diagnostics from this hospitalization (including imaging, microbiology, ancillary and laboratory) are listed below for reference.    LAB RESULTS: Basic Metabolic Panel:  Recent Labs Lab 03/22/17 1455 03/22/17 1513  NA 136 137  K 5.4* 5.2*  CL 109 107  CO2 20*  --   GLUCOSE 257* 247*  BUN 25* 27*  CREATININE 3.23* 3.30*  CALCIUM 8.8*  --    Liver Function Tests:  Recent Labs Lab 03/22/17 1455  AST 22  ALT 17  ALKPHOS 106  BILITOT 0.8  PROT 6.3*  ALBUMIN 3.5   No results for  input(s): LIPASE, AMYLASE in the last 168 hours. No results for input(s): AMMONIA in the last 168 hours. CBC:  Recent Labs Lab 03/22/17 1455 03/22/17 1513  WBC 5.8  --   NEUTROABS 4.1  --   HGB 11.5* 12.9*  HCT 36.1* 38.0*  MCV 77.8*  --   PLT 202  --    Cardiac Enzymes: No results for input(s): CKTOTAL, CKMB, CKMBINDEX, TROPONINI in the last 168 hours. BNP: Invalid input(s): POCBNP CBG:  Recent Labs Lab 03/24/17 0804 03/24/17 1222  GLUCAP 189* 164*    Significant Diagnostic Studies:  Ct Head Wo Contrast  Result Date: 03/22/2017 CLINICAL DATA:  Abnormal sensation and blurred vision today. EXAM: CT HEAD WITHOUT CONTRAST  TECHNIQUE: Contiguous axial images were obtained from the base of the skull through the vertex without intravenous contrast. COMPARISON:  01/12/2015 FINDINGS: Brain: There is old infarction in the right side of the pons which was acute in June of 2016. No cerebellar abnormality is seen. Cerebral hemispheres show mild chronic small-vessel ischemic changes of the deep white matter. No cortical or large vessel territory infarction. No mass lesion, hemorrhage, hydrocephalus or extra-axial collection. Vascular: There is atherosclerotic calcification of the major vessels at the base of the brain. Skull: Normal Sinuses/Orbits: Clear/normal Other: None IMPRESSION: No acute finding by CT. 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 Mild chronic small-vessel ischemic changes of the cerebral hemispheric deep white matter. Electronically Signed   By: Paulina Fusi M.D.   On: 03/22/2017 17:13   Mr Brain Wo Contrast (neuro Protocol)  Result Date: 03/22/2017 CLINICAL DATA:  Suspicion of stroke. Altered sensation. Blurry vision. EXAM: MRI HEAD WITHOUT CONTRAST TECHNIQUE: Multiplanar, multiecho pulse sequences of the brain and surrounding structures were obtained without intravenous contrast. COMPARISON:  Head CT 03/22/2017 FINDINGS: Brain: The midline structures are normal. There is a small,  linear focus of diffusion restriction within the dorsal left pons, in the region of the medial longitudinal fasciculus and paramedian pontine reticular formation. There is multifocal periventricular leukoaraiosis. There is also an old right pontine infarct. Some of these lesions are oriented perpendicularly to the long axis of the lateral ventricles. The extent of white matter disease has markedly worsened. No intraparenchymal hematoma or chronic microhemorrhage. Mildly advanced atrophy for age. The dura is normal and there is no extra-axial collection. Vascular: Major intracranial arterial and venous sinus flow voids are preserved. Skull and upper cervical spine: The visualized skull base, calvarium, upper cervical spine and extracranial soft tissues are normal. Sinuses/Orbits: No fluid levels or advanced mucosal thickening. No mastoid or middle ear effusion. Normal orbits. IMPRESSION: 1. Acute ischemia within the left dorsal pons, in the region of the medial longitudinal fasciculus and paramedian pontine reticular formation. Correlate for horizontal gaze palsy. 2. Marked worsening of multifocal periventricular white matter hyperintensity. It is possible that this is secondary to chronic hypertensive microangiopathy; however, the orientation of the lesions and the progression since the prior MRI a are very concerning for demyelinating disease. CSF sampling or postcontrast MR imaging, including sagittal T2/FLAIR sequence, should be considered. 3. 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 Electronically Signed   By: Deatra Robinson M.D.   On: 03/22/2017 21:54    2D ECHO: Study Conclusions  - Left ventricle: The cavity size was normal. There was mild   concentric hypertrophy. Systolic function was normal. The   estimated ejection fraction was in the range of 60% to 65%. Wall   motion was normal; there were no regional wall motion   abnormalities. Left ventricular diastolic function parameters   were normal. - Aortic  valve: Transvalvular velocity was within the normal range.   There was no stenosis. There was no regurgitation. - Mitral valve: Transvalvular velocity was within the normal range.   There was no evidence for stenosis. There was trivial   regurgitation. - Right ventricle: The cavity size was normal. Wall thickness was   normal. Systolic function was normal. - Tricuspid valve: There was moderate regurgitation. - Pulmonary arteries: Systolic pressure was mildly increased. PA   peak pressure: 40 mm Hg (S).  Disposition and Follow-up: Discharge Instructions    Ambulatory referral to Neurology    Complete by:  As directed    Pt will follow up with Dr. Anne Hahn  at [facility] in about 2 months. Thanks.   Diet Carb Modified    Complete by:  As directed    Increase activity slowly    Complete by:  As directed        DISPOSITION: home    DISCHARGE FOLLOW-UP Follow-up Information    York Spaniel, MD. Schedule an appointment as soon as possible for a visit in 6 week(s).   Specialty:  Neurology Contact information: [address] [place] [address] [phone]        Truman Hayward, FNP. Schedule an appointment as soon as possible for a visit in 2 week(s).   Specialties:  Nurse Practitioner, Psychiatry Contact information: [address] [place] [address] [phone]        Chilton Si, MD. Schedule an appointment as soon as possible for a visit in 2 day(s).   Specialty:  Cardiology Contact information: [address] [place] [address] [phone]            Time spent on Discharge:   Signed:   Thad Ranger M.D. [facility] 03/24/2017, 1:12 PM Pager: [phone]

## 2017-03-28 ENCOUNTER — Other Ambulatory Visit: Payer: Self-pay

## 2017-03-28 ENCOUNTER — Telehealth: Payer: Self-pay | Admitting: Cardiovascular Disease

## 2017-03-28 NOTE — Patient Outreach (Signed)
[facility]) Care Management  03/28/2017  Daniel Santos [birth year] [id number]   EMMI: stroke Referral date: 03/28/17 Referral source: EMMI stroke red alert Referral reason: scheduled a follow up appointment: NO Day # 1  Telephone call to patient. HIPAA verified with patient. Patient request call back due to him currently having lunch.   PLAN: RNCM will attempt 2nd telephone call to patient within 3 business days.   [name] RN,BSN,CCM [facility] Telephonic  [phone]

## 2017-03-28 NOTE — Telephone Encounter (Signed)
C/b ref# [id number] s/w Anthony-he states that since pt was in the hospital nothing is needed at this time

## 2017-03-28 NOTE — Patient Outreach (Signed)
[facility]) Care Management  03/28/2017  Daniel Santos Oct 09, 1960 [id number]  EMMI: stroke Referral date: 03/28/17 Referral source: EMMI stroke red alert Referral reason: scheduled a follow up appointment: NO Day # 1  Telephone call to patient regarding EMMI stroke red alert. HIPAA verified with patient.  Discussed EMMI stroke program with patient. Patient states he has scheduled his follow up appointments. Patient states he has a follow up scheduled with his primary MD and kidney doctor on 03/29/17, appointment with cardiologist on 04/03/17 and appointment with his neurologist on 05/14/17.   Patient denies any new onset of symptoms. Patient states his eye were affected by the stroke. Patient states the focus of his eyes is getting better.  Patient states he has all of his medications and is taking them as prescribed. Patient states he has transportation to his appointments and a good support system at home. Patient denies any further needs at this time.  RNCM discussed signs/ symptoms of stroke with patient. Advised patient to all 911 for stroke like symptoms. Advised patient to call doctor for non emergent symptoms.   PLAN:  RNCM will refer patient to care management assistant to close due to patient being assessed and having no further needs.   [name] RN,BSN,CCM [facility] Telephonic  [phone]

## 2017-03-28 NOTE — Telephone Encounter (Signed)
New message   Monia Pouch calling has question regarding 8/16 appt.

## 2017-04-01 ENCOUNTER — Other Ambulatory Visit: Payer: Self-pay | Admitting: Cardiovascular Disease

## 2017-04-02 ENCOUNTER — Telehealth: Payer: Self-pay | Admitting: Cardiovascular Disease

## 2017-04-02 ENCOUNTER — Other Ambulatory Visit: Payer: Self-pay

## 2017-04-02 NOTE — Telephone Encounter (Signed)
Please review for refill, Thanks !  

## 2017-04-02 NOTE — Patient Outreach (Signed)
[facility]) Care Management  04/02/2017  Daniel Santos 1961/06/05 [id number]  EMMI: Stroke Referral date: 04/02/17 Referral source: EMMI stroke red alert Referral reason: Smoke or been around smoke: YES Day # 6 Attempt #1  Telephone call to patient regarding EMMI stroke red alert. Unable to reach patient. HIPAA compliant voice message left with call back phone number.    PLAN: RNCM will attempt 2nd telephone call to patient within 3 business days   [name] RN,BSN,CCM [facility] Telephonic  [phone]

## 2017-04-02 NOTE — Telephone Encounter (Signed)
Received records from [facility] for appointment on 04/03/17 with Dr Duke Salvia.  Records put with Dr Leonides Sake schedule for 04/03/17. lp

## 2017-04-03 ENCOUNTER — Ambulatory Visit (INDEPENDENT_AMBULATORY_CARE_PROVIDER_SITE_OTHER): Payer: BLUE CROSS/BLUE SHIELD | Admitting: Cardiovascular Disease

## 2017-04-03 ENCOUNTER — Other Ambulatory Visit: Payer: Self-pay

## 2017-04-03 ENCOUNTER — Encounter: Payer: Self-pay | Admitting: Cardiovascular Disease

## 2017-04-03 VITALS — BP 100/68 | HR 70 | Ht 69.0 in | Wt 123.6 lb

## 2017-04-03 DIAGNOSIS — R0789 Other chest pain: Secondary | ICD-10-CM

## 2017-04-03 DIAGNOSIS — E7800 Pure hypercholesterolemia, unspecified: Secondary | ICD-10-CM

## 2017-04-03 DIAGNOSIS — Z720 Tobacco use: Secondary | ICD-10-CM | POA: Diagnosis not present

## 2017-04-03 DIAGNOSIS — I10 Essential (primary) hypertension: Secondary | ICD-10-CM

## 2017-04-03 NOTE — Patient Outreach (Signed)
[facility]) Care Management  04/03/2017  Daniel Santos Dec 15, 1960 [id number]   EMMI: Stroke Referral date: 04/02/17 Referral source: EMMI stroke red alert Referral reason: Smoke or been around smoke: YES Day # 6  Telephone call to patient regarding EMMI stroke red alert. HIPAA verified patient states he is still smoking but has decreased his cigarette intake. Patient states he was smoking 1 ppd.  Patient states on yesterday he smoked only 3 cigarettes. Patient states he has medication to assist him to stop smoking. Patient states he wants to try to do it on his own. RNCM discussed smoking cessation strategies with with patient. RNCM offered to send patient EMMI education material for smoking cessation. Patient verbally agreed. RNCM discussed smoking cessation patches with patient and another alternative. Patient appreciative of information.  Patient refused smoking cessation classes at this time.  Patient denies having any further needs at this time.   PLAN; RNCM will refer patient to care management assistant to close due to patient being assessed and having no further needs.  RNCM will send patient EMMI education material on smoking cessation.  [name] RN,BSN,CCM [facility] Telephonic  [phone]

## 2017-04-03 NOTE — Patient Instructions (Addendum)
Medication Instructions:  Your physician recommends that you continue on your current medications as directed. Please refer to the Current Medication list given to you today.  Labwork: NONE  Testing/Procedures: NOND  Follow-Up: Your physician recommends that you schedule a follow-up appointment in: 4 MONTH OV  If you need a refill on your cardiac medications before your next appointment, please call your pharmacy.

## 2017-04-03 NOTE — Progress Notes (Signed)
Cardiology Office Note   Date:  04/03/2017   ID:  Daniel Santos., DOB [birth year], MRN [id number]  PCP:  Truman Hayward, FNP  Cardiologist:   Chilton Si, MD  Nephrologist: Dr. Arrie Aran  Chief Complaint  Patient presents with  . Follow-up    post hospital      History of Present Illness: Daniel Santos. is a 56 y.o. male with diabetes, hypertension, hyperlipidemia, spinal stenosis, prior polysubstance abuse and prior stroke who presents for follow up.  He was initially seen 12/2015 for an evaluation of palpitations.  His symptoms were mild and he elected not to undergo any ambulatory monitoring. At that time he did report some atypical chest pain and was referred for [facility] 01/06/16 that revealed LVEF 51% and no ischemia. He was started on Wellbutrin for smoking cessation. However he did not try the medication and has over a year's supply at home. He continues to smoke one pack of cigarettes daily.    At his last appointment [name] was not taking his blood pressure medications regularly because he thought the constant to be nauseous. He also was not taking atorvastatin. He typically took his carvedilol once per day instead of twice per day and he reported fatigue. This was switched to nebivolol. Since his last appointment he was admitted 03/2017 with an acute stroke of the left dorsal pons. He presented with visual changes and difficulty focusing his vision.  Echo revealed LVEF 60-65%. Carotid Dopplers showed 1-39% ICA stenosis bilaterally. Nebivolol was switched back to carvedilol at the patient's request.  Since that hospitalization he followed up with his nephrologist, Dr. Arrie Aran and was feeling well.  His blood pressure has been much better-controlled.  He is working to limit potassium in his diet.  He plans to quit smoking today.  Took some puffs this am.    Past Medical History:  Diagnosis Date  . Atypical chest pain 12/14/2015  . Diabetes mellitus without  complication (HCC)   . 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 History of non-insulin dependent diabetes mellitus   . Hypercholesteremia   . Hypertension   . Palpitations 12/14/2015  . Renal disorder     Past Surgical History:  Procedure Laterality Date  . LUMBAR SPINE SURGERY  2005, 2008, 2010  . REPAIR OF FRACTURED PENIS  05/2000   with cystoscopy     Current Outpatient Prescriptions  Medication Sig Dispense Refill  . amLODipine (NORVASC) 10 MG tablet Take 10 mg by mouth every evening.     Marland Kitchen aspirin EC 325 MG EC tablet Take 1 tablet (325 mg total) by mouth daily. 30 tablet 4  . atorvastatin (LIPITOR) 20 MG tablet Take 20 mg by mouth every evening.     Marland Kitchen BUTRANS 20 MCG/HR PTWK patch Place 1 patch onto the skin once a week.    . calcitRIOL (ROCALTROL) 0.25 MCG capsule Take 0.25 mcg by mouth every other day. Monday, Wednesday, Friday    . carvedilol (COREG) 25 MG tablet Take 1 tablet (25 mg total) by mouth 2 (two) times daily with a meal. 60 tablet 11  . clopidogrel (PLAVIX) 75 MG tablet Take 1 tablet (75 mg total) by mouth daily. (Patient taking differently: Take 75 mg by mouth every evening. ) 30 tablet 0  . ferrous sulfate 325 (65 FE) MG tablet Take 325 mg by mouth every evening.     Marland Kitchen HUMALOG 100 UNIT/ML injection Inject  into the skin.     . Insulin Human (INSULIN PUMP) SOLN Inject into the skin. Humulog Insulin Pump    . LINZESS 145 MCG CAPS capsule Take 145 mcg by mouth daily as needed. constipation    . pregabalin (LYRICA) 75 MG capsule Take 75 mg by mouth 2 (two) times daily as needed (pain).    . promethazine (PHENERGAN) 25 MG tablet Take 1 tablet (25 mg total) by mouth every 6 (six) hours as needed for nausea or vomiting. 30 tablet 0   No current facility-administered medications for this visit.     Allergies:   Doxycycline    Social History:  The patient  reports that he has been smoking.  He has been smoking about 1.00 pack  per day. He has never used smokeless tobacco. He reports that he does not drink alcohol or use drugs.   Family History:  The patient's family history includes Breast cancer in his maternal grandmother and paternal grandmother; Cancer in his mother; Diabetes in his paternal aunt and paternal grandmother; Kidney disease in his father; Leukemia in his paternal uncle; Stroke in his paternal grandmother.    ROS:  Please see the history of present illness.   Otherwise, review of systems are positive for none.   All other systems are reviewed and negative.    PHYSICAL EXAM: VS:  BP 100/68   Pulse 70   Ht 5\' 9"  (1.753 m)   Wt 56.1 kg (123 lb 9.6 oz)   BMI 18.25 kg/m  , BMI Body mass index is 18.25 kg/m. GENERAL:  Well appearing.  No acute distress HEENT: Pupils equal round and reactive, fundi not visualized, oral mucosa unremarkable NECK:  No jugular venous distention, waveform within normal limits, carotid upstroke brisk and symmetric, no bruits, no thyromegaly LUNGS:  Clear to auscultation bilaterally HEART:  RRR.  PMI not displaced or sustained,S1 and S2 within normal limits, no S3, no S4, no clicks, no rubs, no murmurs ABD:  Flat, positive bowel sounds normal in frequency in pitch, no bruits, no rebound, no guarding, no midline pulsatile mass, no hepatomegaly, no splenomegaly EXT:  2 plus pulses throughout, no edema, no cyanosis no clubbing SKIN:  No rashes no nodules NEURO:  Cranial nerves II through XII grossly intact, motor grossly intact throughout PSYCH:  Cognitively intact, oriented to person place and time    EKG:  EKG is ordered today. The ekg ordered 12/13/15 demonstrates sinus rhythm rate 60 bpm.  R atrial enlargement.  02/23/17: Sinus rhythm.  Rate 72 bpm.  Biatrial enlargement   Lexiscan Myoview 01/06/16:  The left ventricular ejection fraction is mildly decreased (45-54%).  Nuclear stress EF: 51%.  There was no ST segment deviation noted during stress.  The study is  normal.  This is a low risk study.   Echo 03/23/17: Study Conclusions  - Left ventricle: The cavity size was normal. There was mild   concentric hypertrophy. Systolic function was normal. The   estimated ejection fraction was in the range of 60% to 65%. Wall   motion was normal; there were no regional wall motion   abnormalities. Left ventricular diastolic function parameters   were normal. - Aortic valve: Transvalvular velocity was within the normal range.   There was no stenosis. There was no regurgitation. - Mitral valve: Transvalvular velocity was within the normal range.   There was no evidence for stenosis. There was trivial   regurgitation. - Right ventricle: The cavity size was normal. Wall thickness was  normal. Systolic function was normal. - Tricuspid valve: There was moderate regurgitation. - Pulmonary arteries: Systolic pressure was mildly increased. PA   peak pressure: 40 mm Hg (S).  Carotid Dopplers 03/23/17: 1-39% ICA stenosis bilaterally.  Recent Labs: 03/22/2017: ALT 17; BUN 27; Creatinine, Ser 3.30; Hemoglobin 12.9; Platelets 202; Potassium 5.2; Sodium 137   10/17/16: Sodium 144 potassium 4.8, BUN 23, creatinine 2.8 AST 24, ALT 27 Total cholesterol 149, triglycerides 48, HDL 71, LDL 60  03/29/17: Sodium 141, potassium 5.6, BUN 47, creatinine 3.3 WBC 6.4, hemoglobin 12.1, hematocrit 35.1, platelets 216  Lipid Panel    Component Value Date/Time   CHOL 98 03/23/2017 0559   TRIG 105 03/23/2017 0559   HDL 40 (L) 03/23/2017 0559   CHOLHDL 2.5 03/23/2017 0559   VLDL 21 03/23/2017 0559   LDLCALC 37 03/23/2017 0559       Wt Readings from Last 3 Encounters:  04/03/17 56.1 kg (123 lb 9.6 oz)  03/23/17 60 kg (132 lb 3.2 oz)  02/23/17 59.5 kg (131 lb 3.2 oz)      ASSESSMENT AND PLAN:    # Atypical chest pain: Resolved.  Lexiscan Myoview was negative for ischemia 01/2016.  # Tobacco abuse: Mr. Proietti was encouraged to quit smoking.  He thinks that  today was his last cigarette.  # Hypertension: BP is much better controlled.  He admits to not taking his medication in the past. Continue carvedilol and amlodipine.  # Hyperlipidemia: LDL 37 on 03/2017.  Continue atorvastatin.  # Prior stroke:  Continue aspirin and clopidogrel.  He had a recurrent stroke 03/2017.  BP control is very important.  MRI also showed demyelination and evidence of hypertensive disease.  Follow up with neurology is pending.   Current medicines are reviewed at length with the patient today.  The patient does not have concerns regarding medicines.  The following changes have been made:  Start Wellbutrin  Labs/ tests ordered today include:   No orders of the defined types were placed in this encounter.    Disposition:   FU with Britta Louth C. Duke Salvia, MD, [facility] in 4 months    This note was written with the assistance of speech recognition software.  Please excuse any transcriptional errors.  Signed, Collen Hostler C. Duke Salvia, MD, [facility]  04/03/2017 1:28 PM    [facility]

## 2017-04-10 ENCOUNTER — Telehealth: Payer: Self-pay | Admitting: *Deleted

## 2017-04-10 NOTE — Telephone Encounter (Signed)
Called patient back. Advised we received disability paperwork he is requesting Dr Anne HahnWillis to fill out. Advised Dr. Anne HahnWillis has not seen him since 2016. Cannot fill this out for him.  He stated his PCP actually took care of this, no need for our office to fill this out.  He has upcoming appt on 05/14/17 at 2pm. I advised him of this. He verbalized understanding.

## 2017-05-09 ENCOUNTER — Ambulatory Visit: Payer: BLUE CROSS/BLUE SHIELD | Admitting: Cardiovascular Disease

## 2017-05-14 ENCOUNTER — Encounter: Payer: Self-pay | Admitting: Neurology

## 2017-05-14 ENCOUNTER — Ambulatory Visit (INDEPENDENT_AMBULATORY_CARE_PROVIDER_SITE_OTHER): Payer: BLUE CROSS/BLUE SHIELD | Admitting: Neurology

## 2017-05-14 VITALS — BP 121/70 | HR 64 | Wt 130.5 lb

## 2017-05-14 DIAGNOSIS — I6350 Cerebral infarction due to unspecified occlusion or stenosis of unspecified cerebral artery: Secondary | ICD-10-CM | POA: Diagnosis not present

## 2017-05-14 DIAGNOSIS — I639 Cerebral infarction, unspecified: Secondary | ICD-10-CM

## 2017-05-14 NOTE — Progress Notes (Signed)
Reason for visit: Stroke  Referring physician: [facility]  Daniel Santos. is a 56 y.o. male  History of present illness:  [name] is a 56 year old right-handed black male with a history of diabetes and hypertension and dyslipidemia. The patient also smokes a pack of cigarettes a day, he has been unable to quit. The patient was last seen in 2016 following a pontine stroke. The patient returns to the office today for an evaluation of a second pontine stroke that occurred around 03/22/2017. The patient had returned from work that evening and was working on the computer. He felt that his vision was dimming out on him. The patient went to the hospital and MRI of the brain showed a small midline pontine infarct. The patient was noted to have an INO on clinical examination, and he was felt to be slightly confused. A urine drug screen was not checked. The patient since has gotten very close to his usual baseline with vision. He denied any numbness or weakness of the face, arms, or legs. He denied significant problems with balance or with dizziness. The patient has had a very low-grade pressure sensation the head, he does not have take medication for this. He claims that around the time of the stroke he was not taking his medications regularly, he was skipping days frequently. The patient was on aspirin and Plavix apparently, he was told to stop aspirin in 2016. The patient underwent a carotid Doppler study that was unremarkable. The patient has returned to work. He returns to this office for further evaluation. He continues to smoke a pack cigarettes daily.  Past Medical History:  Diagnosis Date  . Atypical chest pain 12/14/2015  . Diabetes mellitus without complication (HCC)   . 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 History of non-insulin dependent diabetes mellitus   . Hypercholesteremia   . Hypertension   . Palpitations 12/14/2015  . Renal  disorder     Past Surgical History:  Procedure Laterality Date  . LUMBAR SPINE SURGERY  2005, 2008, 2010  . REPAIR OF FRACTURED PENIS  05/2000   with cystoscopy    Family History  Problem Relation Age of Onset  . Cancer Mother   . Leukemia Paternal Uncle   . Stroke Paternal Grandmother   . Diabetes Paternal Grandmother   . Breast cancer Paternal Grandmother   . Kidney disease Father   . Diabetes Paternal Aunt        x2  . Breast cancer Maternal Grandmother     Social history:  reports that he has been smoking.  He has been smoking about 1.00 pack per day. He has never used smokeless tobacco. He reports that he does not drink alcohol or use drugs.  Medications:  Prior to Admission medications   Medication Sig Start Date End Date Taking? Authorizing Provider  amLODipine (NORVASC) 10 MG tablet Take 10 mg by mouth every evening.    Yes [provider]  aspirin EC 325 MG EC tablet Take 1 tablet (325 mg total) by mouth daily. 03/24/17  Yes Rai, Ripudeep K, MD  atorvastatin (LIPITOR) 20 MG tablet Take 20 mg by mouth every evening.    Yes [provider]  BUTRANS 20 MCG/HR PTWK patch Place 1 patch onto the skin once a week. 10/06/15  Yes [provider]  calcitRIOL (ROCALTROL) 0.25 MCG capsule Take 0.25 mcg by mouth every other day. Monday, Wednesday, Friday   Yes  [provider]  carvedilol (COREG) 25 MG tablet Take 1 tablet (25 mg total) by mouth 2 (two) times daily with a meal. 03/24/17 03/24/18 Yes Rai, Ripudeep K, MD  clopidogrel (PLAVIX) 75 MG tablet Take 1 tablet (75 mg total) by mouth daily. Patient taking differently: Take 75 mg by mouth every evening.  01/14/15  Yes Rhetta Mura, MD  ferrous sulfate 325 (65 FE) MG tablet Take 325 mg by mouth every evening.    Yes [provider]  HUMALOG 100 UNIT/ML injection Inject into the skin.  10/19/14  Yes [provider]  Insulin Human (INSULIN PUMP) SOLN Inject into the skin.  Humulog Insulin Pump   Yes [provider]  LINZESS 145 MCG CAPS capsule Take 145 mcg by mouth daily as needed. constipation 10/14/14  Yes [provider]  pregabalin (LYRICA) 75 MG capsule Take 75 mg by mouth 2 (two) times daily as needed (pain).   Yes [provider]  promethazine (PHENERGAN) 25 MG tablet Take 1 tablet (25 mg total) by mouth every 6 (six) hours as needed for nausea or vomiting. 02/23/17  Yes Chilton Si, MD  sulfamethoxazole-trimethoprim (BACTRIM DS,SEPTRA DS) 800-160 MG tablet Take 1 tablet by mouth 2 (two) times daily. 05/09/17  Yes [provider]      Allergies  Allergen Reactions  . Doxycycline Nausea Only    (kidney function?)    ROS:  Out of a complete 14 system review of symptoms, the patient complains only of the following symptoms, and all other reviewed systems are negative.  Recent stroke event Headache  Blood pressure 121/70, pulse 64, weight 130 lb 8 oz (59.2 kg), SpO2 98 %.  Physical Exam  General: The patient is alert and cooperative at the time of the examination.  Eyes: Pupils are equal, round, and reactive to light. Discs are flat bilaterally.  Neck: The neck is supple, no carotid bruits are noted.  Respiratory: The respiratory examination is clear.  Cardiovascular: The cardiovascular examination reveals a regular rate and rhythm, no obvious murmurs or rubs are noted.  Skin: Extremities are without significant edema.  Neurologic Exam  Mental status: The patient is alert and oriented x 3 at the time of the examination. The patient has apparent normal recent and remote memory, with an apparently normal attention span and concentration ability.  Cranial nerves: Facial symmetry is present. There is good sensation of the face to pinprick and soft touch bilaterally. The strength of the facial muscles and the muscles to head turning and shoulder shrug are normal bilaterally. Speech is well enunciated, no  aphasia or dysarthria is noted. Extraocular movements are full. Visual fields are full. The tongue is midline, and the patient has symmetric elevation of the soft palate. No obvious hearing deficits are noted.  Motor: The motor testing reveals 5 over 5 strength of all 4 extremities. Good symmetric motor tone is noted throughout.  Sensory: Sensory testing is intact to pinprick, soft touch, vibration sensation, and position sense on all 4 extremities. No evidence of extinction is noted.  Coordination: Cerebellar testing reveals good finger-nose-finger and heel-to-shin bilaterally.  Gait and station: Gait is normal. Tandem gait is normal. Romberg is negative. No drift is seen.  Reflexes: Deep tendon reflexes are symmetric and normal bilaterally. Toes are downgoing bilaterally.   MRI brain 03/22/17:  IMPRESSION: 1. Acute ischemia within the left dorsal pons, in the region of the medial longitudinal fasciculus and paramedian pontine reticular formation. Correlate for horizontal gaze palsy. 2. Marked  worsening of multifocal periventricular white matter hyperintensity. It is possible that this is secondary to chronic hypertensive microangiopathy; however, the orientation of the lesions and the progression since the prior MRI a are very concerning for demyelinating disease. CSF sampling or postcontrast MR imaging, including sagittal T2/FLAIR sequence, should be considered. 3. 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  * MRI scan images were reviewed online. I agree with the written report.    2D echo 03/23/17:  Study Conclusions  - Left ventricle: The cavity size was normal. There was mild   concentric hypertrophy. Systolic function was normal. The   estimated ejection fraction was in the range of 60% to 65%. Wall   motion was normal; there were no regional wall motion   abnormalities. Left ventricular diastolic function parameters   were normal. - Aortic valve: Transvalvular velocity was within  the normal range.   There was no stenosis. There was no regurgitation. - Mitral valve: Transvalvular velocity was within the normal range.   There was no evidence for stenosis. There was trivial   regurgitation. - Right ventricle: The cavity size was normal. Wall thickness was   normal. Systolic function was normal. - Tricuspid valve: There was moderate regurgitation. - Pulmonary arteries: Systolic pressure was mildly increased. PA   peak pressure: 40 mm Hg (S).    Carotid doppler 03/23/17:  Vascular Ultrasound Carotid Duplex (Doppler) has been completed.  Preliminary findings: Bilateral 1-39% ICA stenosis, antegrade vertebral flow.  Assessment/Plan:  1. Cerebrovascular disease, recent pontine stroke  2. Diabetes  3. Hypertension  4. Dyslipidemia  5. Tobacco abuse  The patient does have multiple risk factors for small vessel disease. The patient is to remain on Plavix, and stop the aspirin. He was encouraged to stop smoking. He was also encouraged to continue taking his medications regularly, not go off of his medication. He will follow-up through this office if needed.  Marlan Palau MD 05/14/2017 2:28 PM  [facility] [address] [place], [place] [address]  Phone [phone] Fax [phone]

## 2017-05-14 NOTE — Patient Instructions (Signed)
   May stop the aspirin and stay on the plavix. Need to stop smoking and continue the medications on a regular basis.

## 2017-06-09 ENCOUNTER — Emergency Department (HOSPITAL_COMMUNITY): Payer: BLUE CROSS/BLUE SHIELD

## 2017-06-09 ENCOUNTER — Observation Stay (HOSPITAL_COMMUNITY)
Admission: EM | Admit: 2017-06-09 | Discharge: 2017-06-10 | Disposition: A | Discharge status: Home / Self Care | Payer: BLUE CROSS/BLUE SHIELD | Attending: Internal Medicine | Admitting: Internal Medicine

## 2017-06-09 ENCOUNTER — Encounter (HOSPITAL_COMMUNITY): Payer: Self-pay

## 2017-06-09 DIAGNOSIS — Z8673 Personal history of transient ischemic attack (TIA), and cerebral infarction without residual deficits: Secondary | ICD-10-CM

## 2017-06-09 DIAGNOSIS — Z7902 Long term (current) use of antithrombotics/antiplatelets: Secondary | ICD-10-CM | POA: Diagnosis not present

## 2017-06-09 DIAGNOSIS — Z881 Allergy status to other antibiotic agents status: Secondary | ICD-10-CM | POA: Diagnosis not present

## 2017-06-09 DIAGNOSIS — R634 Abnormal weight loss: Secondary | ICD-10-CM | POA: Insufficient documentation

## 2017-06-09 DIAGNOSIS — I6381 Other cerebral infarction due to occlusion or stenosis of small artery: Secondary | ICD-10-CM | POA: Diagnosis not present

## 2017-06-09 DIAGNOSIS — L723 Sebaceous cyst: Secondary | ICD-10-CM | POA: Insufficient documentation

## 2017-06-09 DIAGNOSIS — E1151 Type 2 diabetes mellitus with diabetic peripheral angiopathy without gangrene: Secondary | ICD-10-CM | POA: Insufficient documentation

## 2017-06-09 DIAGNOSIS — R221 Localized swelling, mass and lump, neck: Secondary | ICD-10-CM | POA: Diagnosis not present

## 2017-06-09 DIAGNOSIS — R011 Cardiac murmur, unspecified: Secondary | ICD-10-CM

## 2017-06-09 DIAGNOSIS — R51 Headache: Secondary | ICD-10-CM | POA: Diagnosis not present

## 2017-06-09 DIAGNOSIS — D509 Iron deficiency anemia, unspecified: Secondary | ICD-10-CM | POA: Diagnosis present

## 2017-06-09 DIAGNOSIS — Z87891 Personal history of nicotine dependence: Secondary | ICD-10-CM | POA: Insufficient documentation

## 2017-06-09 DIAGNOSIS — I129 Hypertensive chronic kidney disease with stage 1 through stage 4 chronic kidney disease, or unspecified chronic kidney disease: Secondary | ICD-10-CM | POA: Diagnosis not present

## 2017-06-09 DIAGNOSIS — Z794 Long term (current) use of insulin: Secondary | ICD-10-CM | POA: Insufficient documentation

## 2017-06-09 DIAGNOSIS — Z7982 Long term (current) use of aspirin: Secondary | ICD-10-CM

## 2017-06-09 DIAGNOSIS — Z833 Family history of diabetes mellitus: Secondary | ICD-10-CM

## 2017-06-09 DIAGNOSIS — Z79899 Other long term (current) drug therapy: Secondary | ICD-10-CM | POA: Diagnosis not present

## 2017-06-09 DIAGNOSIS — E872 Acidosis: Secondary | ICD-10-CM | POA: Insufficient documentation

## 2017-06-09 DIAGNOSIS — Z9641 Presence of insulin pump (external) (internal): Secondary | ICD-10-CM

## 2017-06-09 DIAGNOSIS — E11649 Type 2 diabetes mellitus with hypoglycemia without coma: Secondary | ICD-10-CM | POA: Diagnosis not present

## 2017-06-09 DIAGNOSIS — Z23 Encounter for immunization: Secondary | ICD-10-CM | POA: Insufficient documentation

## 2017-06-09 DIAGNOSIS — F17211 Nicotine dependence, cigarettes, in remission: Secondary | ICD-10-CM

## 2017-06-09 DIAGNOSIS — Z841 Family history of disorders of kidney and ureter: Secondary | ICD-10-CM

## 2017-06-09 DIAGNOSIS — E7800 Pure hypercholesterolemia, unspecified: Secondary | ICD-10-CM | POA: Diagnosis not present

## 2017-06-09 DIAGNOSIS — D631 Anemia in chronic kidney disease: Secondary | ICD-10-CM | POA: Insufficient documentation

## 2017-06-09 DIAGNOSIS — N184 Chronic kidney disease, stage 4 (severe): Secondary | ICD-10-CM

## 2017-06-09 DIAGNOSIS — R42 Dizziness and giddiness: Secondary | ICD-10-CM | POA: Diagnosis not present

## 2017-06-09 DIAGNOSIS — E1122 Type 2 diabetes mellitus with diabetic chronic kidney disease: Secondary | ICD-10-CM

## 2017-06-09 DIAGNOSIS — I639 Cerebral infarction, unspecified: Secondary | ICD-10-CM

## 2017-06-09 DIAGNOSIS — Z8249 Family history of ischemic heart disease and other diseases of the circulatory system: Secondary | ICD-10-CM

## 2017-06-09 DIAGNOSIS — N179 Acute kidney failure, unspecified: Secondary | ICD-10-CM | POA: Diagnosis not present

## 2017-06-09 LAB — DIFFERENTIAL
Basophils Absolute: 0.1 10*3/uL (ref 0.0–0.1)
Basophils Relative: 1 %
Eosinophils Absolute: 0.4 10*3/uL (ref 0.0–0.7)
Eosinophils Relative: 6 %
LYMPHS PCT: 21 %
Lymphs Abs: 1.2 10*3/uL (ref 0.7–4.0)
MONO ABS: 0.6 10*3/uL (ref 0.1–1.0)
MONOS PCT: 9 %
NEUTROS ABS: 3.9 10*3/uL (ref 1.7–7.7)
Neutrophils Relative %: 63 %

## 2017-06-09 LAB — URINALYSIS, ROUTINE W REFLEX MICROSCOPIC
Bacteria, UA: NONE SEEN
Bilirubin Urine: NEGATIVE
Glucose, UA: NEGATIVE mg/dL
Hgb urine dipstick: NEGATIVE
Ketones, ur: NEGATIVE mg/dL
Leukocytes, UA: NEGATIVE
NITRITE: NEGATIVE
PH: 5 (ref 5.0–8.0)
Protein, ur: 100 mg/dL — AB
SPECIFIC GRAVITY, URINE: 1.009 (ref 1.005–1.030)
Squamous Epithelial / LPF: NONE SEEN

## 2017-06-09 LAB — COMPREHENSIVE METABOLIC PANEL
ALK PHOS: 111 U/L (ref 38–126)
ALT: 15 U/L — AB (ref 17–63)
AST: 22 U/L (ref 15–41)
Albumin: 3.8 g/dL (ref 3.5–5.0)
Anion gap: 9 (ref 5–15)
BUN: 36 mg/dL — ABNORMAL HIGH (ref 6–20)
CALCIUM: 9 mg/dL (ref 8.9–10.3)
CO2: 18 mmol/L — ABNORMAL LOW (ref 22–32)
CREATININE: 4.57 mg/dL — AB (ref 0.61–1.24)
Chloride: 107 mmol/L (ref 101–111)
GFR, EST AFRICAN AMERICAN: 15 mL/min — AB (ref >60)
GFR, EST NON AFRICAN AMERICAN: 13 mL/min — AB (ref >60)
Glucose, Bld: 124 mg/dL — ABNORMAL HIGH (ref 65–99)
Potassium: 4.9 mmol/L (ref 3.5–5.1)
Sodium: 134 mmol/L — ABNORMAL LOW (ref 135–145)
Total Bilirubin: 0.6 mg/dL (ref 0.3–1.2)
Total Protein: 7.3 g/dL (ref 6.5–8.1)

## 2017-06-09 LAB — RAPID URINE DRUG SCREEN, HOSP PERFORMED
AMPHETAMINES: NOT DETECTED
Barbiturates: NOT DETECTED
Benzodiazepines: NOT DETECTED
Cocaine: NOT DETECTED
Opiates: NOT DETECTED
Tetrahydrocannabinol: NOT DETECTED

## 2017-06-09 LAB — I-STAT CHEM 8, ED
BUN: 37 mg/dL — ABNORMAL HIGH (ref 6–20)
CALCIUM ION: 1.27 mmol/L (ref 1.15–1.40)
CHLORIDE: 110 mmol/L (ref 101–111)
Creatinine, Ser: 4.5 mg/dL — ABNORMAL HIGH (ref 0.61–1.24)
GLUCOSE: 130 mg/dL — AB (ref 65–99)
HCT: 40 % (ref 39.0–52.0)
Hemoglobin: 13.6 g/dL (ref 13.0–17.0)
Potassium: 5 mmol/L (ref 3.5–5.1)
Sodium: 138 mmol/L (ref 135–145)
TCO2: 19 mmol/L — ABNORMAL LOW (ref 22–32)

## 2017-06-09 LAB — GLUCOSE, CAPILLARY
GLUCOSE-CAPILLARY: 185 mg/dL — AB (ref 65–99)
GLUCOSE-CAPILLARY: 252 mg/dL — AB (ref 65–99)
Glucose-Capillary: 226 mg/dL — ABNORMAL HIGH (ref 65–99)

## 2017-06-09 LAB — CBC
HEMATOCRIT: 36.2 % — AB (ref 39.0–52.0)
Hemoglobin: 11.4 g/dL — ABNORMAL LOW (ref 13.0–17.0)
MCH: 24.4 pg — ABNORMAL LOW (ref 26.0–34.0)
MCHC: 31.5 g/dL (ref 30.0–36.0)
MCV: 77.4 fL — AB (ref 78.0–100.0)
Platelets: 184 10*3/uL (ref 150–400)
RBC: 4.68 MIL/uL (ref 4.22–5.81)
RDW: 14.5 % (ref 11.5–15.5)
WBC: 6.1 10*3/uL (ref 4.0–10.5)

## 2017-06-09 LAB — I-STAT TROPONIN, ED: TROPONIN I, POC: 0.01 ng/mL (ref 0.00–0.08)

## 2017-06-09 LAB — CBG MONITORING, ED
GLUCOSE-CAPILLARY: 171 mg/dL — AB (ref 65–99)
GLUCOSE-CAPILLARY: 167 mg/dL — AB (ref 65–99)
Glucose-Capillary: 190 mg/dL — ABNORMAL HIGH (ref 65–99)

## 2017-06-09 LAB — ETHANOL: Alcohol, Ethyl (B): <10 mg/dL (ref <10)

## 2017-06-09 LAB — APTT: aPTT: 31 seconds (ref 24–36)

## 2017-06-09 LAB — PROTIME-INR
INR: 1.15
Prothrombin Time: 14.6 seconds (ref 11.4–15.2)

## 2017-06-09 MED ORDER — FERROUS SULFATE 325 (65 FE) MG PO TABS
325.0000 mg | ORAL_TABLET | Freq: Every evening | ORAL | Status: DC
  Administered 2017-06-09: 325 mg via ORAL
  Filled 2017-06-09: qty 1

## 2017-06-09 MED ORDER — ATORVASTATIN CALCIUM 40 MG PO TABS
40.0000 mg | ORAL_TABLET | Freq: Every evening | ORAL | Status: DC
  Administered 2017-06-09: 40 mg via ORAL
  Filled 2017-06-09: qty 1

## 2017-06-09 MED ORDER — SENNOSIDES-DOCUSATE SODIUM 8.6-50 MG PO TABS
1.0000 | ORAL_TABLET | Freq: Every evening | ORAL | Status: DC | PRN
  Filled 2017-06-09: qty 1

## 2017-06-09 MED ORDER — ASPIRIN EC 325 MG PO TBEC
325.0000 mg | DELAYED_RELEASE_TABLET | Freq: Every day | ORAL | Status: DC
  Administered 2017-06-09 – 2017-06-10 (×2): 325 mg via ORAL
  Filled 2017-06-09 (×2): qty 1

## 2017-06-09 MED ORDER — THIAMINE HCL 100 MG/ML IJ SOLN
100.0000 mg | Freq: Once | INTRAMUSCULAR | Status: AC
  Administered 2017-06-09: 100 mg via INTRAVENOUS
  Filled 2017-06-09: qty 2

## 2017-06-09 MED ORDER — BUPRENORPHINE 20 MCG/HR TD PTWK: 20.0000 ug | MEDICATED_PATCH | TRANSDERMAL | Status: DC

## 2017-06-09 MED ORDER — CLOPIDOGREL BISULFATE 75 MG PO TABS
75.0000 mg | ORAL_TABLET | Freq: Every day | ORAL | Status: DC
  Administered 2017-06-09 – 2017-06-10 (×2): 75 mg via ORAL
  Filled 2017-06-09 (×2): qty 1

## 2017-06-09 MED ORDER — HEPARIN SODIUM (PORCINE) 5000 UNIT/ML IJ SOLN
5000.0000 [IU] | Freq: Three times a day (TID) | INTRAMUSCULAR | Status: DC
  Filled 2017-06-09: qty 1

## 2017-06-09 MED ORDER — ACETAMINOPHEN 650 MG RE SUPP: 650.0000 mg | Freq: Four times a day (QID) | RECTAL | Status: DC | PRN

## 2017-06-09 MED ORDER — NEPRO/CARBSTEADY PO LIQD
237.0000 mL | Freq: Two times a day (BID) | ORAL | Status: DC
  Administered 2017-06-09 – 2017-06-10 (×2): 237 mL via ORAL
  Filled 2017-06-09 (×5): qty 237

## 2017-06-09 MED ORDER — LINACLOTIDE 145 MCG PO CAPS
145.0000 ug | ORAL_CAPSULE | Freq: Every day | ORAL | Status: DC | PRN
  Filled 2017-06-09: qty 1

## 2017-06-09 MED ORDER — ACETAMINOPHEN 325 MG PO TABS
650.0000 mg | ORAL_TABLET | Freq: Four times a day (QID) | ORAL | Status: DC | PRN
  Administered 2017-06-09: 650 mg via ORAL
  Filled 2017-06-09: qty 2

## 2017-06-09 MED ORDER — INSULIN PUMP
Freq: Three times a day (TID) | SUBCUTANEOUS | Status: DC
  Administered 2017-06-09: 12:00:00 via SUBCUTANEOUS
  Administered 2017-06-09: 2 via SUBCUTANEOUS
  Administered 2017-06-09: 17:00:00 via SUBCUTANEOUS
  Filled 2017-06-09: qty 1

## 2017-06-09 MED ORDER — PNEUMOCOCCAL VAC POLYVALENT 25 MCG/0.5ML IJ INJ
0.5000 mL | INJECTION | INTRAMUSCULAR | Status: AC
  Administered 2017-06-10: 0.5 mL via INTRAMUSCULAR
  Filled 2017-06-09: qty 0.5

## 2017-06-09 MED ORDER — CALCITRIOL 0.25 MCG PO CAPS: 0.2500 ug | ORAL_CAPSULE | ORAL | Status: DC

## 2017-06-09 NOTE — ED Provider Notes (Signed)
[facility] EMERGENCY DEPARTMENT Provider Note   CSN: [id number] Arrival date & time: 06/09/17  7829     History   Chief Complaint No chief complaint on file.   HPI Daniel Santos. is a 56 y.o. male.  The history is provided by the patient.  He woke up at 2 AM stating that he felt funny and was wearing that he was having another brain situation.  He has history of several strokes and states that each 1 was different, but he was concerned that he was having another one.  He has difficulty articulating how he felt, but he states that when he walks, there would be a stutter step.  He was last known normal at about 11:30 PM.  He denies headache.  He denies nausea or vomiting.  He denies weakness, numbness, tingling.  He denies chest pain or palpitations.  Past Medical History:  Diagnosis Date  . Atypical chest pain 12/14/2015  . Diabetes mellitus without complication (HCC)   . 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 History of non-insulin dependent diabetes mellitus   . Hypercholesteremia   . Hypertension   . Palpitations 12/14/2015  . Renal disorder     Patient Active Problem List   Diagnosis Date Noted  . CKD (chronic kidney disease), stage IV (HCC) 03/23/2017  . Microcytic anemia 03/23/2017  . History of stroke   . 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 Left pontine cerebrovascular accident (HCC) 01/12/2015  . Essential hypertension 01/12/2015  . Tobacco abuse 01/12/2015  . Diabetes mellitus (HCC) 02/12/2009    Past Surgical History:  Procedure Laterality Date  . LUMBAR SPINE SURGERY  2005, 2008, 2010  . REPAIR OF FRACTURED PENIS  05/2000   with cystoscopy       Home Medications    Prior to Admission medications   Medication Sig Start Date End Date Taking? Authorizing Provider  amLODipine (NORVASC) 10 MG tablet Take 10 mg by mouth every evening.      [provider]  aspirin EC 325 MG EC tablet Take 1 tablet (325 mg total) by mouth daily. 03/24/17   Rai, Delene Ruffini, MD  atorvastatin (LIPITOR) 20 MG tablet Take 20 mg by mouth every evening.     [provider]  BUTRANS 20 MCG/HR PTWK patch Place 1 patch onto the skin once a week. 10/06/15   [provider]  calcitRIOL (ROCALTROL) 0.25 MCG capsule Take 0.25 mcg by mouth every other day. Monday, Wednesday, Friday    [provider]  carvedilol (COREG) 25 MG tablet Take 1 tablet (25 mg total) by mouth 2 (two) times daily with a meal. 03/24/17 03/24/18  Rai, Ripudeep K, MD  clopidogrel (PLAVIX) 75 MG tablet Take 1 tablet (75 mg total) by mouth daily. Patient taking differently: Take 75 mg by mouth every evening.  01/14/15   Rhetta Mura, MD  ferrous sulfate 325 (65 FE) MG tablet Take 325 mg by mouth every evening.     [provider]  HUMALOG 100 UNIT/ML injection Inject into the skin.  10/19/14   [provider]  Insulin Human (INSULIN PUMP) SOLN Inject into the skin. Humulog Insulin Pump    [provider]  LINZESS 145 MCG CAPS capsule Take 145 mcg by mouth daily as needed. constipation 10/14/14   [provider]  pregabalin (LYRICA) 75 MG capsule Take 75 mg by mouth 2 (  two) times daily as needed (pain).    [provider]  promethazine (PHENERGAN) 25 MG tablet Take 1 tablet (25 mg total) by mouth every 6 (six) hours as needed for nausea or vomiting. 02/23/17   Chilton Si, MD  sulfamethoxazole-trimethoprim (BACTRIM DS,SEPTRA DS) 800-160 MG tablet Take 1 tablet by mouth 2 (two) times daily. 05/09/17   [provider]    Family History Family History  Problem Relation Age of Onset  . Cancer Mother   . Leukemia Paternal Uncle   . Stroke Paternal Grandmother   . Diabetes Paternal Grandmother   . Breast cancer Paternal Grandmother   . Kidney disease Father   . Diabetes Paternal Aunt        x2  .  Breast cancer Maternal Grandmother     Social History Social History  Substance Use Topics  . Smoking status: Current Every Day Smoker    Packs/day: 1.00  . Smokeless tobacco: Never Used  . Alcohol use No     Allergies   Doxycycline   Review of Systems Review of Systems  All other systems reviewed and are negative.    Physical Exam Updated Vital Signs BP 140/90 (BP Location: Right Arm)   Pulse 71   Temp 98.2 F (36.8 C) (Oral)   Resp 18   Ht 5\' 9"  (1.753 m)   Wt 59 kg (130 lb)   SpO2 97%   BMI 19.20 kg/m   Physical Exam  Nursing note and vitals reviewed.  56 year old male, resting comfortably and in no acute distress. Vital signs are normal. Oxygen saturation is 97%, which is normal. Head is normocephalic and atraumatic. PERRLA, EOMI. Oropharynx is clear.  There is no nystagmus. Neck is nontender and supple without adenopathy or JVD.  There are no carotid bruits. Back is nontender and there is no CVA tenderness. Lungs are clear without rales, wheezes, or rhonchi. Chest is nontender. Heart has regular rate and rhythm without murmur. Abdomen is soft, flat, nontender without masses or hepatosplenomegaly and peristalsis is normoactive. Extremities have no cyanosis or edema, full range of motion is present. Skin is warm and dry without rash. Neurologic: Mental status is normal, cranial nerves are intact.  Motor strength is 5/5 in all muscle groups.  There is no pronator drift.  Sensory exam is normal.  There is no extinction on double simultaneous stimulation.  Finger to nose testing is normal.  Romberg is normal but he is slightly unsteady.  With heel to toe walking, he consistently falls to his left.  ED Treatments / Results  Labs (all labs ordered are listed, but only abnormal results are displayed) Labs Reviewed  CBC - Abnormal; Notable for the following:       Result Value   Hemoglobin 11.4 (*)    HCT 36.2 (*)    MCV 77.4 (*)    MCH 24.4 (*)    All other  components within normal limits  COMPREHENSIVE METABOLIC PANEL - Abnormal; Notable for the following:    Sodium 134 (*)    CO2 18 (*)    Glucose, Bld 124 (*)    BUN 36 (*)    Creatinine, Ser 4.57 (*)    ALT 15 (*)    GFR calc non Af Amer 13 (*)    GFR calc Af Amer 15 (*)    All other components within normal limits  I-STAT CHEM 8, ED - Abnormal; Notable for the following:    BUN 37 (*)  Creatinine, Ser 4.50 (*)    Glucose, Bld 130 (*)    TCO2 19 (*)    All other components within normal limits  CBG MONITORING, ED - Abnormal; Notable for the following:    Glucose-Capillary 190 (*)    All other components within normal limits  ETHANOL  PROTIME-INR  APTT  DIFFERENTIAL  RAPID URINE DRUG SCREEN, HOSP PERFORMED  URINALYSIS, ROUTINE W REFLEX MICROSCOPIC  VITAMIN B1  I-STAT TROPONIN, ED    EKG  EKG Interpretation  Date/Time:  Saturday June 09 2017 05:46:01 EDT Ventricular Rate:  70 PR Interval:  150 QRS Duration: 96 QT Interval:  415 QTC Calculation: 448 R Axis:   72 Text Interpretation:  duplicate, discard Confirmed by Dione BoozeGlick, Darrien Belter ([id number]) on 06/09/2017 7:25:55 AM       Radiology Mr Brain Wo Contrast  Result Date: 06/09/2017 CLINICAL DATA:  56 year old male with dizziness upon waking it 0330 hours, found to be hypoglycemic. Personal history of stroke. EXAM: MRI HEAD WITHOUT CONTRAST TECHNIQUE: Multiplanar, multiecho pulse sequences of the brain and surrounding structures were obtained without intravenous contrast. COMPARISON:  Head CT 0606 hours today. Brain MRI 03/22/2017, and earlier. FINDINGS: Brain: Small linear 6-7 mm focus of restricted diffusion in the paracentral pons, just caudal to a larger chronic pontine lacune (series 350, image 15). No associated acute hemorrhage or mass effect. No other No restricted diffusion or evidence of acute infarction. There are numerous chronic micro hemorrhages throughout the brain (series 8). Most of these were not apparent  previously but probably are seen to better advantage today on 3 Tesla MRI with susceptibility weighted imaging. Scattered small and occasionally patchy bilateral cerebral white matter T2 and FLAIR hyperintensity is stable since 2016. No cortical encephalomalacia identified. The deep gray matter nuclei are within normal limits. The fornix appears stable since 2016 and normal. Mamillary bodies art diminutive but otherwise normal. Periaqueductal gray matter is within normal limits. No midline shift, mass effect, evidence of mass lesion, ventriculomegaly, extra-axial collection or acute intracranial hemorrhage. Cervicomedullary junction and pituitary are within normal limits. Vascular: Major intracranial vascular flow voids are stable with generalized intracranial artery dolichoectasia. Skull and upper cervical spine: Chronically heterogeneous bone marrow signal in the cervical spine appears stable since 2016. Skull bone marrow signal is more normal. Sinuses/Orbits: Stable and negative orbits soft tissues. Fluid level in the right maxillary sinus and mild bilateral maxillary mucosal thickening. Other: Mastoid air cells remain clear. Visible internal auditory structures appear normal. Negative scalp and face soft tissues. IMPRESSION: 1. Tiny acute lacunar infarct in the left paracentral pons, just caudal to the larger chronic pontine lacune. No associated acute hemorrhage or mass effect. 2. Advanced chronic microhemorrhage throughout the brain with an appearance suggestive of amyloid angiopathy -perhaps related to chronic renal disease given that this patient is much younger than typical for that diagnosis. 3. The fornix appears stable and normal by MRI. No evidence of Wernicke encephalopathy. 4. Chronic decreased T1 bone marrow signal in the cervical spine, perhaps renal osteodystrophy. Electronically Signed   By: Odessa FlemingH  Hall M.D.   On: 06/09/2017 07:56   Ct Head Code Stroke Wo Contrast  Result Date: 06/09/2017 CLINICAL  DATA:  Code stroke. LEFT facial droop. History of hypertension, hypercholesterolemia, diabetes and stroke. EXAM: CT HEAD WITHOUT CONTRAST TECHNIQUE: Contiguous axial images were obtained from the base of the skull through the vertex without intravenous contrast. COMPARISON:  MRI of the head March 22, 2017 and CT HEAD March 22, 2017 and CT HEAD January 12, 2015 FINDINGS: BRAIN: No intraparenchymal hemorrhage, mass effect nor midline shift. Old pontine infarcts. Patchy supratentorial white matter hypodensities. Thickened low-density fornices (axial 14/31) increased from recent CT and new from 2016. Mild parenchymal brain volume loss for age. No hydrocephalus. No acute large vascular territory infarcts. No abnormal extra-axial fluid collections. Basal cisterns are patent. VASCULAR: Mild calcific atherosclerosis carotid siphon. SKULL/SOFT TISSUES: No skull fracture. No significant soft tissue swelling. ORBITS/SINUSES: The included ocular globes and orbital contents are normal.Chronic RIGHT maxillary sinusitis. Mastoid air cells are well aerated. OTHER: None. ASPECTS [facility] Stroke Program Early CT Score) - Ganglionic level infarction (caudate, lentiform nuclei, internal capsule, insula, M1-M3 cortex): 7 - Supraganglionic infarction (M4-M6 cortex): 3 Total score (0-10 with 10 being normal): 10 IMPRESSION: 1. No acute infarct. 2. ASPECTS is 10. 3. Increasingly thickened fornix, nonspecific though is associated with Wernicke's. This would be better characterized on MRI of the brain with contrast. 4. Mild to moderate chronic small vessel ischemic disease. Old pontine infarcts. 5. Mild parenchymal brain volume loss for age. 6. Critical Value/emergent results were called by telephone at the time of interpretation on 06/09/2017 at 6:22 am to Dr. Amada Jupiter, Neurology, who verbally acknowledged these results. Electronically Signed   By: Awilda Metro M.D.   On: 06/09/2017 06:26    Procedures Procedures (including  critical care time) CRITICAL CARE Performed by: ZOXWR,UEAVW Total critical care time: 50 minutes Critical care time was exclusive of separately billable procedures and treating other patients. Critical care was necessary to treat or prevent imminent or life-threatening deterioration. Critical care was time spent personally by me on the following activities: development of treatment plan with patient and/or surrogate as well as nursing, discussions with consultants, evaluation of patient's response to treatment, examination of patient, obtaining history from patient or surrogate, ordering and performing treatments and interventions, ordering and review of laboratory studies, ordering and review of radiographic studies, pulse oximetry and re-evaluation of patient's condition.  Medications Ordered in ED Medications  thiamine (B-1) injection 100 mg (not administered)     Initial Impression / Assessment and Plan / ED Course  I have reviewed the triage vital signs and the nursing notes.  Pertinent labs & imaging results that were available during my care of the patient were reviewed by me and considered in my medical decision making (see chart for details).  Probable new posterior circulation stroke.  Only lateralizing finding is falling to the left with heel to toe gait.  Old records are reviewed confirming prior hospitalizations for posterior circulation strokes.  Code stroke was activated and he was sent for CT scans and MRI scans.  CT shows no acute stroke - MRI is pending. Code Stroke is cancelled because of mild deficit, and time from last seen normal. LAbs show worsening renal failure, chronic anemia. Case is discussed with Dr. Antony Contras of Internal Medicine Teaching Service, who agrees to admit the patient.  Final Clinical Impressions(s) / ED Diagnoses   Final diagnoses:  Cerebrovascular accident (CVA), unspecified mechanism (HCC)  Acute renal failure superimposed on stage 4 chronic  kidney disease, unspecified acute renal failure type (HCC)  Microcytic anemia    New Prescriptions New Prescriptions   No medications on file     Dione Booze, MD 06/09/17 [phone]

## 2017-06-09 NOTE — ED Notes (Signed)
Pt. To MRI via stretcher. 

## 2017-06-09 NOTE — H&P (Signed)
Date: 06/09/2017               Patient Name:  Daniel Santos. MRN: [id number]  DOB: 09/27/1960 Age / Sex: 56 y.o., male   PCP: Truman Hayward, FNP         Medical Service: Internal Medicine Teaching Service         Attending Physician: Dr. Earl Lagos, MD    First Contact: Dr. Caron Presume Pager: [phone]  Second Contact: Dr. Antony Contras Pager: [phone]       After Hours (After 5p/  First Contact Pager: [phone]  weekends / holidays): Second Contact Pager: [phone]   Chief Complaint: Dizzness  History of Present Illness: Daniel Santos is a 56 y.o. Male with DM, prior CVA, and HTN who presented to the ED with acute onset dizziness. He went to bed at approximately 11-12 PM in his usual state of health. Upon awakening at 2-3AM he felt dizzy. Initially he thought it was related to hypoglycemia. His CBG was 54. He ate something and he remained symptomatic so he sought further medical care. He is worried this is another stroke, stating that his prior to strokes (most recent in August) he presented with similar symptoms. He states that he takes all his medications as prescribed and his diabetes is well controlled. He endorses chronic headaches since his last stroke but denies using NSAIDs for pain relief. He denies any chest pain, palpitations, SOB, diarrhea, tremor, changes in appetite. He endorses weight loss of approximately 20 lbs since his last stroke.   He later raises concerns about a small mass on the anterior side of his left sternocleidomastoid. He states that his PCP was concerned it may be a abscess and prescribed him Bactrim DS for 7 days.   In the ED he was hemodynamically stable, in sinus rhythm with no neurological deficits. Neurology was consulted and ordered an MRI of the head. Pertinent labs include; Creatinine baseline of 3.2-3.3, found to be 4.5 today. AG 9, but bicarb 18 indicating non-anion gap metabolic acidosis. Glucose 130 on admission. Calcium 9. Ionized calcium 1.27. BAL  <10.   Meds:  Current Meds  Medication Sig  . amLODipine (NORVASC) 10 MG tablet Take 10 mg by mouth every evening.   Marland Kitchen aspirin EC 325 MG EC tablet Take 1 tablet (325 mg total) by mouth daily.  Marland Kitchen atorvastatin (LIPITOR) 20 MG tablet Take 20 mg by mouth every evening.   Marland Kitchen BUTRANS 20 MCG/HR PTWK patch Place 20 mcg onto the skin once a week. On Sunday  . calcitRIOL (ROCALTROL) 0.25 MCG capsule Take 0.25 mcg by mouth every Monday, Wednesday, and Friday.   . carvedilol (COREG) 25 MG tablet Take 1 tablet (25 mg total) by mouth 2 (two) times daily with a meal.  . clopidogrel (PLAVIX) 75 MG tablet Take 1 tablet (75 mg total) by mouth daily. (Patient taking differently: Take 75 mg by mouth every evening. )  . ferrous sulfate 325 (65 FE) MG tablet Take 325 mg by mouth every evening.   . Insulin Human (INSULIN PUMP) SOLN Inject into the skin. Humulog Insulin Pump  . LINZESS 145 MCG CAPS capsule Take 145 mcg by mouth daily as needed. constipation  . pregabalin (LYRICA) 75 MG capsule Take 75 mg by mouth 2 (two) times daily as needed (pain).   Allergies: Allergies as of 06/09/2017 - Review Complete 06/09/2017  Allergen Reaction Noted  . Doxycycline Nausea Only 01/12/2015   Past Medical History:  Diagnosis Date  .  Atypical chest pain 12/14/2015  . Diabetes mellitus without complication (HCC)   . 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 History of non-insulin dependent diabetes mellitus   . Hypercholesteremia   . Hypertension   . Palpitations 12/14/2015  . Renal disorder    Family History:  Grandmother: + DM, HTN Mother: + Cancer Father: + CKD  Social History:  Previously in the [employer]  Quit smoking 3 days prior to presentation, previously smoking 1 PPD for 4 years Rarely uses EtOH Denies the use of illicit substances  Review of Systems: A complete ROS was negative except as per HPI.   Physical Exam: Blood pressure (!) 148/89, pulse 73, temperature  98.2 F (36.8 C), resp. rate 13, height 5\' 9"  (1.753 m), weight 130 lb (59 kg), SpO2 99 %.  General: Thin male in no acute distress HENT: Normocephalic, atraumatic, moist mucus membranes  Pulm: Good air movement, with no wheezing or crackles  CV: RRR, systolic murmur, no tenderness to palpation, no JVD Abdomen: Active bowel sounds, soft, non-distended, no tenderness to palpation  Extremities: Warm and dry with no LE edema  Skin: Multiple excoriation of the back with sebaceous cysts tracts. 1 cm multiple mass that is fixed to the skin with no notable tract on the left anterior sternocleidomastoid that appears consistent with a sebaceous cyst Neuro: Alert and oriented to person place and time. Pupils are equal and reactive. Extraocular eye movements are intact and do not illicit pain. Sensation of the face is intact bilaterally. No facial droop noted. Uvula elevates symmetrically. Shoulder shrug is symmetrical, and tongue is midline with no deviation on protrusion. Gross strength is 5/5 in the upper and lower extremities bilaterally. Sensation to touch is intact in the upper and lower extremities bilaterally.   EKG: personally reviewed: my interpretation is sinus with right atrial enlargement. Consistent with prior EKGs  Assessment & Plan by Problem: Active Problems:   AKI (acute kidney injury) (HCC)  1. Acute CVA. Lacunar infarct in the left paracentral pons. Daniel Santos is a 56 y.o. Male with DM, prior CVA, and HTN presented with dizziness in the setting of hypoglycemia. With correction of the underlying metabolic disturbance his symptoms persisted and he came to the ED. CT Head illustrated no acute infarct, mild to chronic small vessel ischemia with old pontine infarct, and mild parenchymal brain volume loss. However, MRI head showing tiny acute lacunar infarct in the left paracentral pons. He has no history of a-fib and given this is a lacunar infarct it is more likely related to his HTN and DM.  Previous evaluation on 08/17 for left cerebral pontine stroke including echocardiogram and carotid ultrasounds as follows: A1c was found to 7.4. LDL was 37. EF 60-65% with no diastolic dysfunction, trivial mitral regurgitation, and moderate tricuspid regurgitation. Atriums were normal size. Carotid ultrasound showed no flow limiting stenosis. He is currently on ASA 325 mg QD, Plavix 75 mg QD, and Atorvastatin 20 mg QD. I do not think further work-up would be clinically beneficial at this point. We will hold his BP medicines and allow permissive HTN while we monitor overnight. We will also increase his Atorvastatin to 40 mg QD  - Risk factors include; Hypertension, Tobacco Use, Diabetes Mellitus, CKD Stage IV  - Admit to telemetry  - Hold BP medicines to allow permissive HTN  - Increase Atorvastatin to 40 mg QD  - Continue ASA and Plavix  - Encourage smoking cessation  - Neuro checks  -  PT/OT evaluation   2. Acute on Chronic Kidney Injury  - CKD Stage IV  - Baseline creatinine 3.2-3.3, found to be 4.5 today.  - Patient is euvolemic on PE and has been taking Bactrim DS as an outpatient for possible soft tissue infection. This is why his creatinine is elevated.  - There are currently no acute indications for HD. We will continue to monitor and avoid nephrotoxic drugs.  - Will need outpatient nephrology follow-up  3. Hypertension  - On Amlodipine and carvedilol at home  - Will hold and allow permissive HTN in the setting of an acute CVA - If BP exceeds 220/120 we will give prn medications  4. Diabetes Mellitus, controlled  - On insulin pump at home  - Recent A1c of 7.4   Diet: Renal + Carb modified  VTE ppx: SubQ Heparin  Code Status: Full Code  Dispo: Admit patient to Observation with expected length of stay less than 2 midnights.  SignedLevora Dredge: Kay Ricciuti, MD 06/09/2017, 10:06 AM  My Pager: [phone]

## 2017-06-09 NOTE — ED Notes (Signed)
Admitting MD at bedside.

## 2017-06-09 NOTE — Progress Notes (Signed)
Jodi GeraldsJohn Jurado is a 56 yo male presenting to ED via private vehicle with acute onset of slurred speech. Pt has a history of a stroke, states his symptoms this morning feel the same as to when he had his last stroke. Pt last felt well around "11-12" at which time he went to bed. Pt woke up several times, around 0330-0400 he woke feeling "dizzy". His BS at this time was 5554, he felt asymptomatic came in to ED. LKW 2330-000, CBG 124, NIH 0. Pt admitted to [facility] for further evaluation. Code stroke cancelled

## 2017-06-09 NOTE — Progress Notes (Signed)
  New Admission Note:   Arrival Method: Transported via stretcher from ED Mental Orientation: Alert and oriented x 4 Telemetry: 2w12 placed on patient Assessment: completed Skin: assessment completed x 2 RNs. No problems identified except dry and flaky. IV:  Right antecubital Pain: 1/10 pressure felt in head Tubes: none Safety Measures: moderate fall risk 8. Reviewed with patient. Admission: completed 2 west Orientation: done Family: sister arrived during assessment, but was not present when patient first arrived to floor Personal Belongings: wallet, watch at bedside. Pt declined to have wallet locked up. Advised of valuables policy. Patient verbalized understanding.   Orders have been reviewed and implemented. Will continue to monitor the patient. Call light has been placed within reach and bed alarm has been activated.   Hortencia ConradiWendi Ilah Boule, RN MSN CNE [facility] Phone number: [phone]

## 2017-06-09 NOTE — Consult Note (Signed)
Neurology Consultation Reason for Consult: Dizziness Referring Physician: Preston Fleeting, D  CC: Dizziness  History is obtained from:patient  HPI: Daniel Santos. is a 56 y.o. male with a history of stroke who last felt his normal state around "11-12."  He then went to bed.  He woke up a couple of times, but is not certain he was normal then, but around 3:30-4 he definitely was dizzy.  He states that he was not sure if it is just his blood sugar was low or something else is going on.  Took a glucose and it was 54, so he got it up and remained asymptomatic and therefore sought care in the emergency department.  Here, he initially reported the 3:30 timeframe and therefore a code stroke was called.   LKW: 11 PM tpa given?: no, outside of window   ROS: A 14 point ROS was performed and is negative except as noted in the HPI.  Past Medical History:  Diagnosis Date  . Atypical chest pain 12/14/2015  . Diabetes mellitus without complication (HCC)   . 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 History of non-insulin dependent diabetes mellitus   . Hypercholesteremia   . Hypertension   . Palpitations 12/14/2015  . Renal disorder      Family History  Problem Relation Age of Onset  . Cancer Mother   . Leukemia Paternal Uncle   . Stroke Paternal Grandmother   . Diabetes Paternal Grandmother   . Breast cancer Paternal Grandmother   . Kidney disease Father   . Diabetes Paternal Aunt        x2  . Breast cancer Maternal Grandmother      Social History:  reports that he has been smoking.  He has been smoking about 1.00 pack per day. He has never used smokeless tobacco. He reports that he does not drink alcohol or use drugs.   Exam: Current vital signs: BP 140/90 (BP Location: Right Arm)   Pulse 71   Temp 98.2 F (36.8 C) (Oral)   Resp 18   Ht 5\' 9"  (1.753 m)   Wt 59 kg (130 lb)   SpO2 97%   BMI 19.20 kg/m  Vital signs in last 24 hours: Temp:   [98.2 F (36.8 C)] 98.2 F (36.8 C) (11/03 0525) Pulse Rate:  [71] 71 (11/03 0525) Resp:  [18] 18 (11/03 0525) BP: (140)/(90) 140/90 (11/03 0525) SpO2:  [97 %] 97 % (11/03 0525) Weight:  [59 kg (130 lb)] 59 kg (130 lb) (11/03 0525)   Physical Exam  Constitutional: Appears well-developed and well-nourished.  Psych: Affect appropriate to situation Eyes: No scleral injection HENT: No OP obstrucion Head: Normocephalic.  Cardiovascular: Normal rate and regular rhythm.  Respiratory: Effort normal and breath sounds normal to anterior ascultation GI: Soft.  No distension. There is no tenderness.  Skin: WDI  Neuro: Mental Status: Patient is awake, alert, oriented to person, place, month, year, and situation. Patient is able to give a clear and coherent history. No signs of aphasia or neglect Cranial Nerves: II: Visual Fields are full. Pupils are equal, round, and reactive to light.   III,IV, VI: EOMI without ptosis or diploplia.  V: Facial sensation is symmetric to temperature VII: Facial movement is symmetric.  VIII: hearing is intact to voice X: Uvula elevates symmetrically XI: Shoulder shrug is symmetric. XII: tongue is midline without atrophy or fasciculations.  Motor: Tone is normal. Bulk is normal. 5/5 strength was  present in all four extremities.  Sensory: Sensation is symmetric to light touch and temperature in the arms and legs. Deep Tendon Reflexes: 2+ and symmetric in the biceps and patellae.  Plantars: Toes are downgoing bilaterally.  Cerebellar: FNF and HKS are intact bilaterally Gait: Unsteady when standing up and transferring stretcher  I have reviewed labs in epic and the results pertinent to this consultation are: Creatinine 4.5  I have reviewed the images obtained: CT head-no acute findings  Impression: 56 year old male with new onset dizziness.  Stroke is possible, though unmasking of previous symptoms due to physiological stressor or AKI are also  equally possible.  He will need an MRI of the brain.  Pregabalin can cause dizziness in the setting of AK I.  Recommendations: 1) MRI brain 2) B1 level, B1 supplementation 3) if MRI is negative, I would favor treating any metabolic abnormalities    Ritta SlotMcNeill Finlay Mills, MD [facility] [phone]  If 7pm- 7am, please page neurology on call as listed in AMION.

## 2017-06-09 NOTE — ED Notes (Signed)
Code stroke cancelled per MD Preston FleetingGlick due to pt LSN being 11pm last night and the patient was/is LVO negative.

## 2017-06-10 ENCOUNTER — Other Ambulatory Visit: Payer: Self-pay

## 2017-06-10 DIAGNOSIS — Z7982 Long term (current) use of aspirin: Secondary | ICD-10-CM | POA: Diagnosis not present

## 2017-06-10 DIAGNOSIS — N179 Acute kidney failure, unspecified: Secondary | ICD-10-CM | POA: Diagnosis not present

## 2017-06-10 DIAGNOSIS — I6381 Other cerebral infarction due to occlusion or stenosis of small artery: Secondary | ICD-10-CM | POA: Diagnosis not present

## 2017-06-10 DIAGNOSIS — Z8673 Personal history of transient ischemic attack (TIA), and cerebral infarction without residual deficits: Secondary | ICD-10-CM | POA: Diagnosis not present

## 2017-06-10 DIAGNOSIS — N184 Chronic kidney disease, stage 4 (severe): Secondary | ICD-10-CM | POA: Diagnosis not present

## 2017-06-10 DIAGNOSIS — E1122 Type 2 diabetes mellitus with diabetic chronic kidney disease: Secondary | ICD-10-CM | POA: Diagnosis not present

## 2017-06-10 DIAGNOSIS — I639 Cerebral infarction, unspecified: Secondary | ICD-10-CM

## 2017-06-10 DIAGNOSIS — Z7902 Long term (current) use of antithrombotics/antiplatelets: Secondary | ICD-10-CM | POA: Diagnosis not present

## 2017-06-10 DIAGNOSIS — I129 Hypertensive chronic kidney disease with stage 1 through stage 4 chronic kidney disease, or unspecified chronic kidney disease: Secondary | ICD-10-CM | POA: Diagnosis not present

## 2017-06-10 DIAGNOSIS — Z881 Allergy status to other antibiotic agents status: Secondary | ICD-10-CM | POA: Diagnosis not present

## 2017-06-10 DIAGNOSIS — Z79899 Other long term (current) drug therapy: Secondary | ICD-10-CM | POA: Diagnosis not present

## 2017-06-10 LAB — BASIC METABOLIC PANEL
ANION GAP: 7 (ref 5–15)
BUN: 40 mg/dL — ABNORMAL HIGH (ref 6–20)
CALCIUM: 8.6 mg/dL — AB (ref 8.9–10.3)
CO2: 17 mmol/L — ABNORMAL LOW (ref 22–32)
Chloride: 110 mmol/L (ref 101–111)
Creatinine, Ser: 4.29 mg/dL — ABNORMAL HIGH (ref 0.61–1.24)
GFR, EST AFRICAN AMERICAN: 16 mL/min — AB (ref >60)
GFR, EST NON AFRICAN AMERICAN: 14 mL/min — AB (ref >60)
GLUCOSE: 122 mg/dL — AB (ref 65–99)
Potassium: 5.2 mmol/L — ABNORMAL HIGH (ref 3.5–5.1)
Sodium: 134 mmol/L — ABNORMAL LOW (ref 135–145)

## 2017-06-10 LAB — GLUCOSE, CAPILLARY
GLUCOSE-CAPILLARY: 75 mg/dL (ref 65–99)
GLUCOSE-CAPILLARY: 151 mg/dL — AB (ref 65–99)
Glucose-Capillary: 159 mg/dL — ABNORMAL HIGH (ref 65–99)

## 2017-06-10 MED ORDER — ATORVASTATIN CALCIUM 40 MG PO TABS: 40.0000 mg | ORAL_TABLET | Freq: Every evening | ORAL | 2 refills | Status: AC

## 2017-06-10 NOTE — Discharge Instructions (Signed)
Thank you for allowing us to provide your care.   - Please do not take your Amlodipine or Carvedilol today. You may restart your Amlodipine on 11/5 and your Carvedilol on 11/6   - Please avoid medications such as ibuprofen, Aleve, motrin, Goody powder, or other NSAIDs  - Please stop your Lyrica. However, if you need it you may take 1 tablet.   - Follow-up with your kidney doctor as soon as possible.   - Continue all your other medications as prescribed.   - Quit smoking   Stroke Prevention Some health problems and behaviors may make it more likely for you to have a stroke. Below are ways to lessen your risk of having a stroke.  Be active for at least 30 minutes on most or all days.  Do not smoke. Try not to be around others who smoke.  Do not drink too much alcohol. ? Do not have more than 2 drinks a day if you are a man. ? Do not have more than 1 drink a day if you are a woman and are not pregnant.  Eat healthy foods, such as fruits and vegetables. If you were put on a specific diet, follow the diet as told.  Keep your cholesterol levels under control through diet and medicines. Look for foods that are low in saturated fat, trans fat, cholesterol, and are high in fiber.  If you have diabetes, follow all diet plans and take your medicine as told.  Ask your doctor if you need treatment to lower your blood pressure. If you have high blood pressure (hypertension), follow all diet plans and take your medicine as told by your doctor.  If you are 1618-57 years old, have your blood pressure checked every 3-5 years. If you are age 57 or older, have your blood pressure checked every year.  Keep a healthy weight. Eat foods that are low in calories, salt, saturated fat, trans fat, and cholesterol.  Do not take drugs.  Avoid birth control pills, if this applies. Talk to your doctor about the risks of taking birth control pills.  Talk to your doctor if you have sleep problems (sleep  apnea).  Take all medicine as told by your doctor. ? You may be told to take aspirin or blood thinner medicine. Take this medicine as told by your doctor. ? Understand your medicine instructions.  Make sure any other conditions you have are being taken care of.  Get help right away if:  You suddenly lose feeling (you feel numb) or have weakness in your face, arm, or leg.  Your face or eyelid hangs down to one side.  You suddenly feel confused.  You have trouble talking (aphasia) or understanding what people are saying.  You suddenly have trouble seeing in one or both eyes.  You suddenly have trouble walking.  You are dizzy.  You lose your balance or your movements are clumsy (uncoordinated).  You suddenly have a very bad headache and you do not know the cause.  You have new chest pain.  Your heart feels like it is fluttering or skipping a beat (irregular heartbeat). Do not wait to see if the symptoms above go away. Get help right away. Call your local emergency services (911 in [place] Do not drive yourself to the hospital. This information is not intended to replace advice given to you by your health care provider. Make sure you discuss any questions you have with your health care provider. Document Released: 01/23/2012 Document  Revised: 12/30/2015 Document Reviewed: 01/24/2013 Elsevier Interactive Patient Education  [employer].

## 2017-06-10 NOTE — Progress Notes (Signed)
   Subjective: Doing well this AM. Tolerating PO intake. States that he feels back to baseline. Was able to ambulate to the restroom without dizziness. Discussed the plan to continue current medication with no further work-up for his stroke. He will need outpatient follow-up with his nephrologist due to his worsening renal function. He agrees with the plan. Will touch base with nephrology and possible discharge home today.  Objective: Vital signs in last 24 hours: Vitals:   06/09/17 1215 06/09/17 1250 06/09/17 1728 06/09/17 2135  BP: 135/90 (!) 152/83 (!) 154/80 (!) 142/76  Pulse: 69 77 78 75  Resp: 13 16 18 18   Temp:  98.3 F (36.8 C) 98.2 F (36.8 C) 98.6 F (37 C)  TempSrc:  Oral Oral Oral  SpO2: 98% 100% 97% 97%  Weight:    123 lb (55.8 kg)  Height:  5\' 9"  (1.753 m)     General: Thin male in no acute distress Pulm: Good air movement with no wheezing or crackles  CV: RRR, systolic murmur Abdomen: Active bowel sounds, soft, no tenderness to palpation  Neuro: Cranial nerves intact bilaterally, gross strength 5/5 in upper and lower extremities bilaterally, sensation to touch intact in the LE bilaterally   Assessment/Plan:  1. Acute CVA. Lacunar infarct in the left paracentral pons. 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 Echo, carotid ultrasound, telemetry, A1c, and LDL all WNL  - Continue Aspirin 325 mg and Plavix 75 mg  - Continue Atorvastatin 40 mg  - Continue to have no neuro deficits  - PT/OT consulted  - Encouraged risk factor modification including smoking cessation  - Allowing permissive HTN  2. Acute on Chronic Kidney Injury  - 2/2 Bactrim DS use as an outpatient  - CKD Stage IV, Baseline creatinine 3.2-3.3 - Creatinine 4.29 today  - Pertinent lab abnormalities include K 5.2 and CO2 of 17  - Will touch base with nephrology and needs outpatient nephrology follow-up   3. Hypertension  - BP max 154/80 over the interval  - Will  hold home amlodipine and carvedilol and allow permissive HTN in the setting of an acute CVA - If BP exceeds 220/120 we will give prn medications  4. Diabetes Mellitus, controlled  - On insulin pump at home  - Recent A1c of 7.4   Dispo: Anticipated discharge in approximately 0 day(s).   Levora DredgeHelberg, Ramond Darnell, MD 06/10/2017, 5:09 AM My Pager: [phone]

## 2017-06-10 NOTE — Evaluation (Signed)
Physical Therapy Evaluation Patient Details Name: Daniel Santos. MRN: [id number] DOB: Sep 02, 1960 Today's Date: 06/10/2017   History of Present Illness   56 y.o. Male with DM, prior CVA, and HTN who presented to the ED with acute onset dizziness. pt with TIA - changing his cholesterol meds  Clinical Impression  Pt denies any changes.  I did some testing and pt does have bilateral leg weakness and risk of falls.  Pt denies history of falls.  Pt is weak in his ankles - left more than right.  Pt given written instructions for 3 exercises - listed below.  His wife present for education.  No further skilled PT needs.  All education complete.    Follow Up Recommendations No PT follow up    Equipment Recommendations  None recommended by PT    Recommendations for Other Services       Precautions / Restrictions Precautions Precaution Comments: pt denies any history of falls      Mobility  Bed Mobility Overal bed mobility: Independent                Transfers Overall transfer level: Independent Equipment used: None                Ambulation/Gait Ambulation/Gait assistance: Independent   Assistive device: None       General Gait Details: pt walking with no gait deviations.  pt worked on marching, walking on toes (this was very hard) and walking on heels (hard on left leg - weak DF).  no loss of balance seen  Stairs            Wheelchair Mobility    Modified Rankin (Stroke Patients Only)       Balance                                 Standardized Balance Assessment Standardized Balance Assessment : Berg Balance Test Berg Balance Test Sit to Stand: Able to stand without using hands and stabilize independently Standing Unsupported: Able to stand safely 2 minutes Stand to Sit: Sits safely with minimal use of hands Transfers: Able to transfer safely, minor use of hands Standing Unsupported with Eyes Closed: Able to stand 10 seconds  safely Standing Ubsupported with Feet Together: Able to place feet together independently but unable to hold for 30 seconds From Standing, Reach Forward with Outstretched Arm: Can reach forward >12 cm safely (5") From Standing Position, Pick up Object from Floor: Able to pick up shoe safely and easily From Standing Position, Turn to Look Behind Over each Shoulder: Looks behind from both sides and weight shifts well Turn 360 Degrees: Able to turn 360 degrees safely but slowly Standing Unsupported, Alternately Place Feet on Step/Stool: Able to complete 4 steps without aid or supervision Standing Unsupported, One Foot in Front: Able to take small step independently and hold 30 seconds Standing on One Leg: Able to lift leg independently and hold equal to or more than 3 seconds         Pertinent Vitals/Pain Pain Assessment: No/denies pain(pt reports history of 3 back surgeries but no pain now - reveiwed body precautions with pt)    Home Living Family/patient expects to be discharged to:: Private residence Living Arrangements: Spouse/significant other Available Help at Discharge: Family;Friend(s) Type of Home: Apartment       Home Layout: One level        Prior Function  Comments: Pt works third shift as Patent examinerequpment tech     Hand Dominance        Extremity/Trunk Assessment        Lower Extremity Assessment Lower Extremity Assessment: Generalized weakness(pt reports old left ankle injury -no pain but weak)    Cervical / Trunk Assessment Cervical / Trunk Assessment: Normal  Communication   Communication: No difficulties  Cognition   Behavior During Therapy: WFL for tasks assessed/performed                                   General Comments: pt denies any changes from this TIA      General Comments General comments (skin integrity, edema, etc.): Pt scored 41/56 on BERG.  pt has hardest time with unilateral stance - left is hardest (old left  ankle injury) but right also weak.  Pt given exercises to ehlp with his balance    Exercises Other Exercises Other Exercises: Pt standing near sink and not holdign on - did hip abduction x 10 reps each leg - cued to stand straight and make sure leg to the side - hard for him to do on both sides Other Exercises: Pt stood with wall behind his back and did anterior and posterior weight shifts to the limit of his stabilty - hard in both directions.  Cued to keep weight on big  toes when shfiting weight forward Other Exercises: Pt did 5 sit to stands from chair - without using his UEs.  pt cued to sit slowly.  pt did well but very hard - he needed cues to let knees bend when he sits.  he denied pain doing this exercise   Assessment/Plan    PT Assessment Patent does not need any further PT services  PT Problem List         PT Treatment Interventions      PT Goals (Current goals can be found in the Care Plan section)  Acute Rehab PT Goals Patient Stated Goal: to get back to normal -  not to have another stroke PT Goal Formulation: With patient/family    Frequency     Barriers to discharge        Co-evaluation               AM-PAC PT "6 Clicks" Daily Activity  Outcome Measure Difficulty turning over in bed (including adjusting bedclothes, sheets and blankets)?: None Difficulty moving from lying on back to sitting on the side of the bed? : None Difficulty sitting down on and standing up from a chair with arms (e.g., wheelchair, bedside commode, etc,.)?: None Help needed moving to and from a bed to chair (including a wheelchair)?: None Help needed walking in hospital room?: None Help needed climbing 3-5 steps with a railing? : None 6 Click Score: 24    End of Session   Activity Tolerance: Patient tolerated treatment well Patient left: in bed;with family/visitor present Nurse Communication: Mobility status PT Visit Diagnosis: Unsteadiness on feet (R26.81)    Time:  [id number] PT Time Calculation (min) (ACUTE ONLY): 20 min   Charges:   PT Evaluation $PT Eval Low Complexity: 1 Low PT Treatments $Therapeutic Exercise: 8-22 mins   PT G Codes:        06/10/2017   Ranae PalmsElizabeth Ferdinand Revoir, PT   Judson RochHildreth, Deandria Klute Gardner 06/10/2017, 12:19 PM

## 2017-06-10 NOTE — Progress Notes (Signed)
OT Cancellation Note  Patient Details Name: Daniel SavoyJohn A Ricci Jr. MRN: [id number] DOB: 10/30/1960   Cancelled Treatment:    Reason Eval/Treat Not Completed: OT screened, no needs identified, will sign off. Pt and wife present and Pt dressed himself, has been ambulating to the bathroom without problem, no questions or concerns. OT to sign off.  Evern BioLaura J Prabhav Faulkenberry 06/10/2017, 11:05 AM  Sherryl MangesLaura Jensine Luz OTR/L [phone]

## 2017-06-10 NOTE — Discharge Summary (Signed)
Name: Daniel SavoyJohn A Pergola Jr. MRN: [id number] DOB: 02/22/1961 56 y.o. PCP: Truman HaywardStarkes, Takia S, FNP  Date of Admission: 06/09/2017  5:28 AM Date of Discharge: 06/10/2017 Attending Physician: Earl LagosNarendra, Nischal, MD  Discharge Diagnosis: 1. CVA.  2. Acute on CKD  Principal Problem:   CVA (cerebral vascular accident) (HCC) Active Problems:   CKD (chronic kidney disease), stage IV (HCC)   AKI (acute kidney injury) (HCC)  Discharge Medications: Allergies as of 06/10/2017      Reactions   Doxycycline Nausea Only   (kidney function?)      Medication List    STOP taking these medications   pregabalin 75 MG capsule Commonly known as:  LYRICA   promethazine 25 MG tablet Commonly known as:  PHENERGAN     TAKE these medications   amLODipine 10 MG tablet Commonly known as:  NORVASC Take 10 mg by mouth every evening.   aspirin 325 MG EC tablet Take 1 tablet (325 mg total) by mouth daily.   atorvastatin 40 MG tablet Commonly known as:  LIPITOR Take 1 tablet (40 mg total) every evening by mouth. What changed:    medication strength  how much to take   BUTRANS 20 MCG/HR Ptwk patch Generic drug:  buprenorphine Place 20 mcg onto the skin once a week. On Sunday   calcitRIOL 0.25 MCG capsule Commonly known as:  ROCALTROL Take 0.25 mcg by mouth every Monday, Wednesday, and Friday.   carvedilol 25 MG tablet Commonly known as:  COREG Take 1 tablet (25 mg total) by mouth 2 (two) times daily with a meal.   clopidogrel 75 MG tablet Commonly known as:  PLAVIX Take 1 tablet (75 mg total) by mouth daily. What changed:  when to take this   ferrous sulfate 325 (65 FE) MG tablet Take 325 mg by mouth every evening.   insulin pump Soln Inject into the skin. Humulog Insulin Pump   LINZESS 145 MCG Caps capsule Generic drug:  linaclotide Take 145 mcg by mouth daily as needed. constipation      Disposition and follow-up:   Daniel A Earle GellGraves [facility]. [facility]  At the hospital follow up visit please address:  1.  CVA. Ensure he was able to increase his Atorvastatin to 40 mg daily. Discuss risk modification such has smoking cessation. CKD. Discuss avoiding nephrotoxic drugs such as NSAIDs, Bactrim, and see if he was able to follow-up with nephrology.   2.  Labs / imaging needed at time of follow-up: BMP to check his kidney function   3.  Pending labs/ test needing follow-up: None  Follow-up Appointments: Follow-up Information    Starkes, Juel Burrowakia S, FNP Follow up.   Specialties:  Nurse Practitioner, Psychiatry Contact information: [address] [place] [address] [phone]        Terrial Rhodesoladonato, Joseph, MD Follow up.   Specialty:  Nephrology Contact information: [address] [place] [address] [phone]          Hospital Course by problem list: Principal Problem:   CVA (cerebral vascular accident) [facility](HCC) Active Problems:   CKD (chronic kidney disease), stage IV (HCC)   AKI (acute kidney injury) (HCC)   1. Acute CVA. Lacunar infarct in the left paracentral pons. Daniel Santos is a 56 y.o. Male with DM, prior CVA, and HTN presented with dizziness in the setting of hypoglycemia. With correction of the underlying metabolic disturbance his symptoms persisted and he came to the ED. CT  Head illustrated no acute infarct, mild to chronic small vessel ischemia with old pontine infarct, and mild parenchymal brain volume loss. However, MRI head showing tiny acute lacunar infarct in the left paracentral pons. He has no history of a-fib and given this is a lacunar infarct it is more likely related to his HTN and DM. Previous evaluation on 08/17 for left cerebral pontine stroke including echocardiogram and carotid ultrasounds as follows: A1c was found to 7.4. LDL was 37. EF 60-65% with no diastolic dysfunction, trivial mitral regurgitation, and moderate tricuspid regurgitation. Atriums were normal size. Carotid  ultrasound showed no flow limiting stenosis. He is currently on ASA 325 mg QD, Plavix 75 mg QD, and Atorvastatin 20 mg QD. We increased his Atorvastatin to 40 mg QD. He was given instructions to restart his Amlodipine on 11/5 and his Carvedilol on 11/6. He was discharged in stable condition with no neuro deficits.   2. Acute on Chronic Kidney Disease. Daniel Santos was previously Stage IV however presented with worsening renal function in the setting of Bactrim DS use. During his admission there were no indications for emergent HD. His creatinine did trend down with prior to discharge. He follows with [facility] and they were notified of his current hospitalization. Their office will contact him to schedule a follow-up appointment. He was given information pertaining to medications, food, and other substance to avoid. His Lyrica was stopped. He was told he can take 75mg  if absolutely needed but no more.   Discharge Vitals:   BP 137/79 (BP Location: Left Arm)   Pulse 67   Temp 98.3 F (36.8 C) (Oral)   Resp 17   Ht 5\' 9"  (1.753 m)   Wt 123 lb (55.8 kg)   SpO2 99%   BMI 18.16 kg/m   Pertinent Labs, Studies, and Procedures:   CT Head  IMPRESSION: 1. No acute infarct. 2. ASPECTS is 10. 3. Increasingly thickened fornix, nonspecific though is associated with Wernicke's. This would be better characterized on MRI of the brain with contrast. 4. Mild to moderate chronic small vessel ischemic disease. Old pontine infarcts. 5. Mild parenchymal brain volume loss for age.  MRI Head IMPRESSION: 1. Tiny acute lacunar infarct in the left paracentral pons, just caudal to the larger chronic pontine lacune. No associated acute hemorrhage or mass effect. 2. Advanced chronic microhemorrhage throughout the brain with an appearance suggestive of amyloid angiopathy -perhaps related to chronic renal disease given that this patient is much younger than typical for that diagnosis. 3. The fornix appears  stable and normal by MRI. No evidence of Wernicke encephalopathy. 4. Chronic decreased T1 bone marrow signal in the cervical spine, perhaps renal osteodystrophy.  Discharge Instructions: Discharge Instructions    Diet - low sodium heart healthy   Complete by:  As directed    Increase activity slowly   Complete by:  As directed      Signed: Levora Dredge, MD 06/10/2017, 10:27 AM   My Pager: [phone]

## 2017-06-13 LAB — VITAMIN B1: VITAMIN B1 (THIAMINE): 60 nmol/L — AB (ref 66.5–200.0)

## 2017-06-21 ENCOUNTER — Encounter: Payer: Self-pay | Admitting: Nephrology

## 2017-07-30 DIAGNOSIS — E7800 Pure hypercholesterolemia, unspecified: Secondary | ICD-10-CM | POA: Insufficient documentation

## 2017-07-30 DIAGNOSIS — Z8639 Personal history of other endocrine, nutritional and metabolic disease: Secondary | ICD-10-CM | POA: Insufficient documentation

## 2017-07-30 DIAGNOSIS — N289 Disorder of kidney and ureter, unspecified: Secondary | ICD-10-CM | POA: Insufficient documentation

## 2017-07-30 DIAGNOSIS — E119 Type 2 diabetes mellitus without complications: Secondary | ICD-10-CM

## 2017-07-30 DIAGNOSIS — I10 Essential (primary) hypertension: Secondary | ICD-10-CM | POA: Insufficient documentation

## 2017-08-02 NOTE — Progress Notes (Deleted)
Cardiology Office Note   Date:  08/02/2017   ID:  Daniel SavoyJohn A Burtch Jr., DOB 08/17/1960, MRN [id number]  PCP:  Daniel HaywardStarkes, Daniel S, FNP  Cardiologist:   Daniel Siiffany Angel [place], MD  Nephrologist: Dr. Arrie Santos  No chief complaint on file.    History of Present Illness: Daniel SavoyJohn A Daniel Jr. is a 56 y.o. male with diabetes, hypertension, hyperlipidemia, prior stroke, spinal stenosis, and prior polysubstance abuse who presents for follow up.  He was initially seen 12/2015 for an evaluation of palpitations.  His symptoms were mild and he elected not to undergo any ambulatory monitoring. At that time he did report some atypical chest pain and [facility] that revealed LVEF 51% and no ischemia. He was started on Wellbutrin for smoking cessation. However he did not try the medication and has over a year'Santos supply at home.  Daniel Santos was admitted 03/2017 with an acute stroke of the left dorsal pons. He presented with visual changes and difficulty focusing his vision.  Echo revealed LVEF 60-65%. Carotid Dopplers showed 1-39% ICA stenosis bilaterally. Nebivolol was switched back to carvedilol at the patient'Santos request.   At his last appointment Daniel Santos decided to quit smoking.  Since that time he was admitted 06/2017 with recurrent stroke of the L paracentral pons.  Atorvastatin was increased to 40mg .  That hospitalization was also associated with acute on chronic renal failure in the setting of using Bactrim.       Past Medical History:  Diagnosis Date  . Atypical chest pain 12/14/2015  . Diabetes mellitus without complication (HCC)   . 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 History of non-insulin dependent diabetes mellitus   . Hypercholesteremia   . Hypertension   . Palpitations 12/14/2015  . Renal disorder     Past Surgical History:  Procedure Laterality Date  . LUMBAR SPINE SURGERY  2005, 2008, 2010  . REPAIR OF FRACTURED PENIS   05/2000   with cystoscopy     Current Outpatient Medications  Medication Sig Dispense Refill  . amLODipine (NORVASC) 10 MG tablet Take 10 mg by mouth every evening.     Marland Kitchen. aspirin EC 325 MG EC tablet Take 1 tablet (325 mg total) by mouth daily. 30 tablet 4  . atorvastatin (LIPITOR) 40 MG tablet Take 1 tablet (40 mg total) every evening by mouth. 30 tablet 2  . BUTRANS 20 MCG/HR PTWK patch Place 20 mcg onto the skin once a week. On Sunday    . calcitRIOL (ROCALTROL) 0.25 MCG capsule Take 0.25 mcg by mouth every Monday, Wednesday, and Friday.     . carvedilol (COREG) 25 MG tablet Take 1 tablet (25 mg total) by mouth 2 (two) times daily with a meal. 60 tablet 11  . clopidogrel (PLAVIX) 75 MG tablet Take 1 tablet (75 mg total) by mouth daily. (Patient taking differently: Take 75 mg by mouth every evening. ) 30 tablet 0  . ferrous sulfate 325 (65 FE) MG tablet Take 325 mg by mouth every evening.     . Insulin Human (INSULIN PUMP) SOLN Inject into the skin. Humulog Insulin Pump    . LINZESS 145 MCG CAPS capsule Take 145 mcg by mouth daily as needed. constipation     No current facility-administered medications for this visit.     Allergies:   Doxycycline    Social History:  The patient  reports that he quit smoking about 8 weeks ago.  His smoking use included cigarettes. He smoked 1.00 pack per day. he has never used smokeless tobacco. He reports that he does not drink alcohol or use drugs.   Family History:  The patient'Santos family history includes Breast cancer in his maternal grandmother and paternal grandmother; Cancer in his mother; Diabetes in his paternal aunt and paternal grandmother; Kidney disease in his father; Leukemia in his paternal uncle; Stroke in his paternal grandmother.    ROS:  Please see the history of present illness.   Otherwise, review of systems are positive for none.   All other systems are reviewed and negative.    PHYSICAL EXAM: VS:  There were no vitals taken for  this visit. , BMI There is no height or weight on file to calculate BMI. GENERAL:  Well appearing.  No acute distress HEENT: Pupils equal round and reactive, fundi not visualized, oral mucosa unremarkable NECK:  No jugular venous distention, waveform within normal limits, carotid upstroke brisk and symmetric, no bruits, no thyromegaly LUNGS:  Clear to auscultation bilaterally HEART:  RRR.  PMI not displaced or sustained,S1 and S2 within normal limits, no S3, no S4, no clicks, no rubs, no murmurs ABD:  Flat, positive bowel sounds normal in frequency in pitch, no bruits, no rebound, no guarding, no midline pulsatile mass, no hepatomegaly, no splenomegaly EXT:  2 plus pulses throughout, no edema, no cyanosis no clubbing SKIN:  No rashes no nodules NEURO:  Cranial nerves II through XII grossly intact, motor grossly intact throughout PSYCH:  Cognitively intact, oriented to person place and time    EKG:  EKG is ordered today. The ekg ordered 12/13/15 demonstrates sinus rhythm rate 60 bpm.  R atrial enlargement.  02/23/17: Sinus rhythm.  Rate 72 bpm.  Biatrial enlargement   Lexiscan Myoview 01/06/16:  The left ventricular ejection fraction is mildly decreased (45-54%).  Nuclear stress EF: 51%.  There was no ST segment deviation noted during stress.  The study is normal.  This is a low risk study.   Echo 03/23/17: Study Conclusions  - Left ventricle: The cavity size was normal. There was mild   concentric hypertrophy. Systolic function was normal. The   estimated ejection fraction was in the range of 60% to 65%. Wall   motion was normal; there were no regional wall motion   abnormalities. Left ventricular diastolic function parameters   were normal. - Aortic valve: Transvalvular velocity was within the normal range.   There was no stenosis. There was no regurgitation. - Mitral valve: Transvalvular velocity was within the normal range.   There was no evidence for stenosis. There was  trivial   regurgitation. - Right ventricle: The cavity size was normal. Wall thickness was   normal. Systolic function was normal. - Tricuspid valve: There was moderate regurgitation. - Pulmonary arteries: Systolic pressure was mildly increased. PA   peak pressure: 40 mm Hg (Santos).  Carotid Dopplers 03/23/17: 1-39% ICA stenosis bilaterally.  Recent Labs: 06/09/2017: ALT 15; Hemoglobin 13.6; Platelets 184 06/10/2017: BUN 40; Creatinine, Ser 4.29; Potassium 5.2; Sodium 134   10/17/16: Sodium 144 potassium 4.8, BUN 23, creatinine 2.8 AST 24, ALT 27 Total cholesterol 149, triglycerides 48, HDL 71, LDL 60  03/29/17: Sodium 141, potassium 5.6, BUN 47, creatinine 3.3 WBC 6.4, hemoglobin 12.1, hematocrit 35.1, platelets 216  Lipid Panel    Component Value Date/Time   CHOL 98 03/23/2017 0559   TRIG 105 03/23/2017 0559   HDL 40 (L) 03/23/2017 0559   CHOLHDL 2.5 03/23/2017 0559  VLDL 21 03/23/2017 0559   LDLCALC 37 03/23/2017 0559       Wt Readings from Last 3 Encounters:  06/09/17 123 lb (55.8 kg)  05/14/17 130 lb 8 oz (59.2 kg)  04/03/17 123 lb 9.6 oz (56.1 kg)      ASSESSMENT AND PLAN:    # Atypical chest pain: Resolved.  Lexiscan Myoview was negative for ischemia 01/2016.  # Tobacco abuse: Daniel Santos was encouraged to quit smoking.  He thinks that today was his last cigarette.  # Hypertension: BP is much better controlled.  He admits to not taking his medication in the past. Continue carvedilol and amlodipine.  # Hyperlipidemia: LDL 37 on 03/2017.  Continue atorvastatin.  # Prior stroke:  Continue aspirin and clopidogrel.  He had a recurrent stroke 03/2017.  BP control is very important.  MRI also showed demyelination and evidence of hypertensive disease.  Follow up with neurology is pending.   Current medicines are reviewed at length with the patient today.  The patient does not have concerns regarding medicines.  The following changes have been made:  Start  Wellbutrin  Labs/ tests ordered today include:   No orders of the defined types were placed in this encounter.    Disposition:   FU with Athziry Millican C. Duke Salviaandolph, MD, [facility] in 4 months    This note was written with the assistance of speech recognition software.  Please excuse any transcriptional errors.  Signed, Anil Havard C. Duke Salviaandolph, MD, [facility]  08/02/2017 11:00 AM    [facility]

## 2017-08-03 ENCOUNTER — Ambulatory Visit: Payer: BLUE CROSS/BLUE SHIELD | Admitting: Cardiovascular Disease

## 2017-08-17 DIAGNOSIS — J320 Chronic maxillary sinusitis: Secondary | ICD-10-CM | POA: Diagnosis not present

## 2017-08-17 DIAGNOSIS — R040 Epistaxis: Secondary | ICD-10-CM | POA: Diagnosis not present

## 2017-08-17 DIAGNOSIS — R51 Headache: Secondary | ICD-10-CM | POA: Diagnosis not present

## 2017-08-23 DIAGNOSIS — N184 Chronic kidney disease, stage 4 (severe): Secondary | ICD-10-CM | POA: Diagnosis not present

## 2017-08-23 DIAGNOSIS — I129 Hypertensive chronic kidney disease with stage 1 through stage 4 chronic kidney disease, or unspecified chronic kidney disease: Secondary | ICD-10-CM | POA: Diagnosis not present

## 2017-08-23 DIAGNOSIS — H524 Presbyopia: Secondary | ICD-10-CM | POA: Diagnosis not present

## 2017-08-23 DIAGNOSIS — N2581 Secondary hyperparathyroidism of renal origin: Secondary | ICD-10-CM | POA: Diagnosis not present

## 2017-08-23 DIAGNOSIS — D631 Anemia in chronic kidney disease: Secondary | ICD-10-CM | POA: Diagnosis not present

## 2017-08-24 ENCOUNTER — Other Ambulatory Visit: Payer: Self-pay

## 2017-08-24 DIAGNOSIS — Z01812 Encounter for preprocedural laboratory examination: Secondary | ICD-10-CM

## 2017-08-24 DIAGNOSIS — N185 Chronic kidney disease, stage 5: Secondary | ICD-10-CM

## 2017-09-21 ENCOUNTER — Ambulatory Visit (INDEPENDENT_AMBULATORY_CARE_PROVIDER_SITE_OTHER)
Admission: RE | Admit: 2017-09-21 | Discharge: 2017-09-21 | Disposition: A | Discharge status: Home / Self Care | Payer: BLUE CROSS/BLUE SHIELD | Source: Ambulatory Visit | Attending: Vascular Surgery | Admitting: Vascular Surgery

## 2017-09-21 ENCOUNTER — Ambulatory Visit (HOSPITAL_COMMUNITY)
Admission: RE | Admit: 2017-09-21 | Discharge: 2017-09-21 | Disposition: A | Discharge status: Home / Self Care | Payer: BLUE CROSS/BLUE SHIELD | Source: Ambulatory Visit | Attending: Vascular Surgery | Admitting: Vascular Surgery

## 2017-09-21 DIAGNOSIS — Z01812 Encounter for preprocedural laboratory examination: Secondary | ICD-10-CM

## 2017-09-21 DIAGNOSIS — E109 Type 1 diabetes mellitus without complications: Secondary | ICD-10-CM | POA: Diagnosis not present

## 2017-09-21 DIAGNOSIS — N185 Chronic kidney disease, stage 5: Secondary | ICD-10-CM

## 2017-09-21 DIAGNOSIS — E108 Type 1 diabetes mellitus with unspecified complications: Secondary | ICD-10-CM | POA: Diagnosis not present

## 2017-09-21 DIAGNOSIS — Z794 Long term (current) use of insulin: Secondary | ICD-10-CM | POA: Diagnosis not present

## 2017-09-24 ENCOUNTER — Ambulatory Visit (INDEPENDENT_AMBULATORY_CARE_PROVIDER_SITE_OTHER): Payer: BLUE CROSS/BLUE SHIELD | Admitting: Surgery

## 2017-09-24 ENCOUNTER — Other Ambulatory Visit: Payer: Self-pay

## 2017-09-24 ENCOUNTER — Encounter: Payer: Self-pay | Admitting: Surgery

## 2017-09-24 VITALS — BP 139/80 | HR 75 | Temp 97.8°F | Resp 16 | Ht 69.0 in | Wt 136.0 lb

## 2017-09-24 DIAGNOSIS — N184 Chronic kidney disease, stage 4 (severe): Secondary | ICD-10-CM | POA: Diagnosis not present

## 2017-09-24 NOTE — Progress Notes (Signed)
[facility]ist of [place]  Patient name: Daniel Santos. MRN: [id number] DOB: May 21, 1961 Sex: male   REQUESTING PROVIDER:    Dr. Arrie Aran   REASON FOR CONSULT:    ESRD  HISTORY OF PRESENT ILLNESS:   Daniel Santos. is a 57 y.o. male, who is referred for evaluation of dialysis access.  He is right handed.  His renal failure is secondary to diabetes and hypertension.  He has a history of stroke in the past.  He is on dual antiplatelet therapy.  He is a chronic smoker.  He takes a statin for hypercholesterolemia.  PAST MEDICAL HISTORY    Past Medical History:  Diagnosis Date  . Atypical chest pain 12/14/2015  . Diabetes mellitus without complication (HCC)   . 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 History of non-insulin dependent diabetes mellitus   . Hypercholesteremia   . Hypertension   . Palpitations 12/14/2015  . Renal disorder      FAMILY HISTORY   Family History  Problem Relation Age of Onset  . Cancer Mother   . Leukemia Paternal Uncle   . Stroke Paternal Grandmother   . Diabetes Paternal Grandmother   . Breast cancer Paternal Grandmother   . Kidney disease Father   . Diabetes Paternal Aunt        x2  . Breast cancer Maternal Grandmother     SOCIAL HISTORY:   Social History   Socioeconomic History  . Marital status: Single    Spouse name: Not on file  . Number of children: 3  . Years of education: 8  . Highest education level: Not on file  Social Needs  . Financial resource strain: Not on file  . Food insecurity - worry: Not on file  . Food insecurity - inability: Not on file  . Transportation needs - medical: Not on file  . Transportation needs - non-medical: Not on file  Occupational History  . Not on file  Tobacco Use  . Smoking status: Former Smoker    Packs/day: 1.00    Types: Cigarettes    Last attempt to quit: 06/06/2017    Years since  quitting: 0.3  . Smokeless tobacco: Never Used  Substance and Sexual Activity  . Alcohol use: No  . Drug use: No  . Sexual activity: Not on file  Other Topics Concern  . Not on file  Social History Narrative   Patient drinks about 2 cups of caffeine daily.   Patient is right handed.    ALLERGIES:    Allergies  Allergen Reactions  . Doxycycline Nausea Only    (kidney function?)    CURRENT MEDICATIONS:    Current Outpatient Medications  Medication Sig Dispense Refill  . amLODipine (NORVASC) 10 MG tablet Take 10 mg by mouth every evening.     Marland Kitchen aspirin EC 325 MG EC tablet Take 1 tablet (325 mg total) by mouth daily. 30 tablet 4  . atorvastatin (LIPITOR) 40 MG tablet Take 1 tablet (40 mg total) every evening by mouth. 30 tablet 2  . BUTRANS 20 MCG/HR PTWK patch Place 20 mcg onto the skin once a week. On Sunday    . calcitRIOL (ROCALTROL) 0.25 MCG capsule Take 0.25 mcg by mouth every Monday, Wednesday, and Friday.     . carvedilol (COREG) 25 MG tablet Take 1 tablet (25 mg total) by mouth 2 (two) times daily with a meal. 60 tablet 11  .  clopidogrel (PLAVIX) 75 MG tablet Take 1 tablet (75 mg total) by mouth daily. (Patient taking differently: Take 75 mg by mouth every evening. ) 30 tablet 0  . ferrous sulfate 325 (65 FE) MG tablet Take 325 mg by mouth every evening.     . furosemide (LASIX) 40 MG tablet Take 40 mg by mouth.    . Insulin Human (INSULIN PUMP) SOLN Inject into the skin. Humulog Insulin Pump    . LINZESS 145 MCG CAPS capsule Take 145 mcg by mouth daily as needed. constipation     No current facility-administered medications for this visit.     REVIEW OF SYSTEMS:   [X]  denotes positive finding, [ ]  denotes negative finding Cardiac  Comments:  Chest pain or chest pressure:    Shortness of breath upon exertion:    Short of breath when lying flat:    Irregular heart rhythm:        Vascular    Pain in calf, thigh, or hip brought on by ambulation:    Pain in feet  at night that wakes you up from your sleep:     Blood clot in your veins:    Leg swelling:  x       Pulmonary    Oxygen at home:    Productive cough:     Wheezing:         Neurologic    Sudden weakness in arms or legs:     Sudden numbness in arms or legs:     Sudden onset of difficulty speaking or slurred speech:    Temporary loss of vision in one eye:     Problems with dizziness:         Gastrointestinal    Blood in stool:      Vomited blood:         Genitourinary    Burning when urinating:     Blood in urine:        Psychiatric    Major depression:         Hematologic    Bleeding problems:    Problems with blood clotting too easily:        Skin    Rashes or ulcers:        Constitutional    Fever or chills:     PHYSICAL EXAM:   Vitals:   09/24/17 1401  BP: 139/80  Pulse: 75  Resp: 16  Temp: 97.8 F (36.6 C)  TempSrc: Oral  SpO2: 97%  Weight: 136 lb (61.7 kg)  Height: 5\' 9"  (1.753 m)    GENERAL: The patient is a well-nourished male, in no acute distress. The vital signs are documented above. CARDIAC: There is a regular rate and rhythm.  VASCULAR: palpable left radial pulse PULMONARY: Nonlabored respirations MUSCULOSKELETAL: There are no major deformities or cyanosis. SKIN: There are no ulcers or rashes noted. PSYCHIATRIC: The patient has a normal affect.  STUDIES:   I have reviewed his vascular lab studies.  He appears to have an adequate left basilic vein.  The left cephalic vein does not appear to be adequate.  His arterial study was normal  ASSESSMENT and PLAN   Chronic renal insufficiency: I discussed proceeding with a staged basilic vein fistula creation.  He will need to be off of his Plavix.  The patient has questions concerning whether or not he should do peritoneal or hemodialysis.  He is scheduled to see Dr. Arrie Aran in approximately 1 month.  He will have further  discussions at that time and then contact me once he has made a decision to  proceed with access.   Durene CalWells Brabham, MD [facility] Tel [phone] Pager [phone]

## 2017-10-01 DIAGNOSIS — D638 Anemia in other chronic diseases classified elsewhere: Secondary | ICD-10-CM | POA: Diagnosis not present

## 2017-10-01 DIAGNOSIS — E7800 Pure hypercholesterolemia, unspecified: Secondary | ICD-10-CM | POA: Diagnosis not present

## 2017-10-01 DIAGNOSIS — E109 Type 1 diabetes mellitus without complications: Secondary | ICD-10-CM | POA: Diagnosis not present

## 2017-10-01 DIAGNOSIS — N184 Chronic kidney disease, stage 4 (severe): Secondary | ICD-10-CM | POA: Diagnosis not present

## 2017-10-08 DIAGNOSIS — N189 Chronic kidney disease, unspecified: Secondary | ICD-10-CM | POA: Diagnosis not present

## 2017-10-08 DIAGNOSIS — I10 Essential (primary) hypertension: Secondary | ICD-10-CM | POA: Diagnosis not present

## 2017-10-08 DIAGNOSIS — E7800 Pure hypercholesterolemia, unspecified: Secondary | ICD-10-CM | POA: Diagnosis not present

## 2017-10-08 DIAGNOSIS — E109 Type 1 diabetes mellitus without complications: Secondary | ICD-10-CM | POA: Diagnosis not present

## 2017-10-11 DIAGNOSIS — N184 Chronic kidney disease, stage 4 (severe): Secondary | ICD-10-CM | POA: Diagnosis not present

## 2017-10-11 DIAGNOSIS — E1121 Type 2 diabetes mellitus with diabetic nephropathy: Secondary | ICD-10-CM | POA: Diagnosis not present

## 2017-10-11 DIAGNOSIS — H539 Unspecified visual disturbance: Secondary | ICD-10-CM | POA: Diagnosis not present

## 2017-10-11 DIAGNOSIS — I129 Hypertensive chronic kidney disease with stage 1 through stage 4 chronic kidney disease, or unspecified chronic kidney disease: Secondary | ICD-10-CM | POA: Diagnosis not present

## 2017-10-15 ENCOUNTER — Other Ambulatory Visit: Payer: Self-pay | Admitting: *Deleted

## 2017-10-15 ENCOUNTER — Telehealth: Payer: Self-pay | Admitting: *Deleted

## 2017-10-15 NOTE — Telephone Encounter (Signed)
[facility] 7:45 am on 10/24/17 for surgery. NPO past MN and must have driver for home. Stay on ASA , Hold Plavix for 5 days pre-op per Dr. Myra GianottiBrabham. Expect a call and follow the detailed [facility] about this surgery. Verbalized understanding.

## 2017-10-17 ENCOUNTER — Telehealth: Payer: Self-pay | Admitting: *Deleted

## 2017-10-17 DIAGNOSIS — D631 Anemia in chronic kidney disease: Secondary | ICD-10-CM | POA: Diagnosis not present

## 2017-10-17 DIAGNOSIS — N184 Chronic kidney disease, stage 4 (severe): Secondary | ICD-10-CM | POA: Diagnosis not present

## 2017-10-17 DIAGNOSIS — I129 Hypertensive chronic kidney disease with stage 1 through stage 4 chronic kidney disease, or unspecified chronic kidney disease: Secondary | ICD-10-CM | POA: Diagnosis not present

## 2017-10-17 DIAGNOSIS — N2581 Secondary hyperparathyroidism of renal origin: Secondary | ICD-10-CM | POA: Diagnosis not present

## 2017-10-17 NOTE — Telephone Encounter (Signed)
-----   Message from Truman Haywardakia S Starkes, FNP sent at 10/17/2017 11:47 AM EDT ----- Regarding: RE: Medication clearance Anti Coag management.  Good morning, yes Mr. Luiz BlareGraves came into the office last week to discuss his concerns about coming off Plavix. He has been cleared to hold his Plavix for 5 days. He was advised if he needed additional reassurance or clarification to follow up with neurology. Will make note of his procedure. Thank you so much for the update.  ----- Message ----- From: Retta Macoberts, Vermon Grays J, RN Sent: 10/11/2017   4:45 PM To: Truman Haywardakia S Starkes, FNP Subject: Medication clearance Anti Coag management.     Surgery scheduled for 10/24/17 . Requesting medication clearance for PLAVIX. Will be scheduling patient for HD access with Dr. Myra GianottiBrabham. Will need to hold PLAVIX for 5 days pre-op. Please ADVISE. Thank you, Museum/gallery conservatorBecky RN

## 2017-10-23 ENCOUNTER — Encounter (HOSPITAL_COMMUNITY): Payer: Self-pay | Admitting: *Deleted

## 2017-10-23 ENCOUNTER — Other Ambulatory Visit: Payer: Self-pay | Admitting: *Deleted

## 2017-10-23 NOTE — Progress Notes (Signed)
[facility] 9[facility]45 am on 10/26/17 with Dr. Arbie CookeyEarly. To follow the detailed instructions from the pre-admission center and remain on aspirin and off Plavix. Must have a driver for home. Verbalized understanding.

## 2017-10-25 ENCOUNTER — Other Ambulatory Visit: Payer: Self-pay

## 2017-10-25 ENCOUNTER — Encounter (HOSPITAL_COMMUNITY): Payer: Self-pay | Admitting: *Deleted

## 2017-10-26 ENCOUNTER — Ambulatory Visit (HOSPITAL_COMMUNITY): Payer: BLUE CROSS/BLUE SHIELD | Admitting: Certified Registered Nurse Anesthetist

## 2017-10-26 ENCOUNTER — Ambulatory Visit (HOSPITAL_COMMUNITY)
Admission: RE | Admit: 2017-10-26 | Discharge: 2017-10-26 | Disposition: A | Discharge status: Home / Self Care | Payer: BLUE CROSS/BLUE SHIELD | Source: Ambulatory Visit | Attending: Vascular Surgery | Admitting: Vascular Surgery

## 2017-10-26 ENCOUNTER — Encounter (HOSPITAL_COMMUNITY): Payer: Self-pay | Admitting: Certified Registered Nurse Anesthetist

## 2017-10-26 ENCOUNTER — Encounter (HOSPITAL_COMMUNITY)
Admission: RE | Disposition: A | Discharge status: Home / Self Care | Payer: Self-pay | Source: Ambulatory Visit | Attending: Vascular Surgery

## 2017-10-26 DIAGNOSIS — E7800 Pure hypercholesterolemia, unspecified: Secondary | ICD-10-CM | POA: Insufficient documentation

## 2017-10-26 DIAGNOSIS — Z7982 Long term (current) use of aspirin: Secondary | ICD-10-CM | POA: Insufficient documentation

## 2017-10-26 DIAGNOSIS — I69359 Hemiplegia and hemiparesis following cerebral infarction affecting unspecified side: Secondary | ICD-10-CM | POA: Diagnosis not present

## 2017-10-26 DIAGNOSIS — Z794 Long term (current) use of insulin: Secondary | ICD-10-CM | POA: Insufficient documentation

## 2017-10-26 DIAGNOSIS — D631 Anemia in chronic kidney disease: Secondary | ICD-10-CM | POA: Diagnosis not present

## 2017-10-26 DIAGNOSIS — E1122 Type 2 diabetes mellitus with diabetic chronic kidney disease: Secondary | ICD-10-CM | POA: Diagnosis not present

## 2017-10-26 DIAGNOSIS — N184 Chronic kidney disease, stage 4 (severe): Secondary | ICD-10-CM | POA: Diagnosis not present

## 2017-10-26 DIAGNOSIS — Z7902 Long term (current) use of antithrombotics/antiplatelets: Secondary | ICD-10-CM | POA: Diagnosis not present

## 2017-10-26 DIAGNOSIS — I129 Hypertensive chronic kidney disease with stage 1 through stage 4 chronic kidney disease, or unspecified chronic kidney disease: Secondary | ICD-10-CM | POA: Insufficient documentation

## 2017-10-26 DIAGNOSIS — Z87891 Personal history of nicotine dependence: Secondary | ICD-10-CM | POA: Diagnosis not present

## 2017-10-26 DIAGNOSIS — Z79899 Other long term (current) drug therapy: Secondary | ICD-10-CM | POA: Diagnosis not present

## 2017-10-26 DIAGNOSIS — N185 Chronic kidney disease, stage 5: Secondary | ICD-10-CM | POA: Diagnosis not present

## 2017-10-26 DIAGNOSIS — R002 Palpitations: Secondary | ICD-10-CM | POA: Diagnosis not present

## 2017-10-26 HISTORY — PX: BASCILIC VEIN TRANSPOSITION: SHX5742

## 2017-10-26 HISTORY — DX: Cerebral infarction, unspecified: I63.9

## 2017-10-26 LAB — POCT I-STAT 4, (NA,K, GLUC, HGB,HCT)
GLUCOSE: 115 mg/dL — AB (ref 65–99)
HEMATOCRIT: 36 % — AB (ref 39.0–52.0)
Hemoglobin: 12.2 g/dL — ABNORMAL LOW (ref 13.0–17.0)
Potassium: 4.5 mmol/L (ref 3.5–5.1)
SODIUM: 145 mmol/L (ref 135–145)

## 2017-10-26 LAB — HEMOGLOBIN A1C
HEMOGLOBIN A1C: 7.7 % — AB (ref 4.8–5.6)
Mean Plasma Glucose: 174.29 mg/dL

## 2017-10-26 LAB — GLUCOSE, CAPILLARY: Glucose-Capillary: 140 mg/dL — ABNORMAL HIGH (ref 65–99)

## 2017-10-26 SURGERY — TRANSPOSITION, VEIN, BASILIC
Anesthesia: Monitor Anesthesia Care | Site: Arm Lower | Laterality: Left

## 2017-10-26 MED ORDER — CHLORHEXIDINE GLUCONATE 4 % EX LIQD: 60.0000 mL | Freq: Once | CUTANEOUS | Status: DC

## 2017-10-26 MED ORDER — FENTANYL CITRATE (PF) 250 MCG/5ML IJ SOLN
INTRAMUSCULAR | Status: AC
  Filled 2017-10-26: qty 5

## 2017-10-26 MED ORDER — SODIUM CHLORIDE 0.9 % IV SOLN
INTRAVENOUS | Status: DC | PRN
  Administered 2017-10-26: 500 mL

## 2017-10-26 MED ORDER — LIDOCAINE HCL (CARDIAC) 20 MG/ML IV SOLN
INTRAVENOUS | Status: AC
  Filled 2017-10-26: qty 5

## 2017-10-26 MED ORDER — LIDOCAINE 2% (20 MG/ML) 5 ML SYRINGE
INTRAMUSCULAR | Status: DC | PRN
  Administered 2017-10-26: 60 mg via INTRAVENOUS

## 2017-10-26 MED ORDER — ONDANSETRON HCL 4 MG/2ML IJ SOLN
INTRAMUSCULAR | Status: AC
  Filled 2017-10-26: qty 2

## 2017-10-26 MED ORDER — CEFAZOLIN SODIUM-DEXTROSE 2-4 GM/100ML-% IV SOLN
INTRAVENOUS | Status: AC
  Filled 2017-10-26: qty 100

## 2017-10-26 MED ORDER — MIDAZOLAM HCL 2 MG/2ML IJ SOLN
INTRAMUSCULAR | Status: AC
  Filled 2017-10-26: qty 2

## 2017-10-26 MED ORDER — LIDOCAINE-EPINEPHRINE 0.5 %-1:200000 IJ SOLN
INTRAMUSCULAR | Status: AC
  Filled 2017-10-26: qty 1

## 2017-10-26 MED ORDER — SODIUM CHLORIDE 0.9 % IV SOLN
INTRAVENOUS | Status: DC
  Administered 2017-10-26: 10:00:00 via INTRAVENOUS

## 2017-10-26 MED ORDER — PROPOFOL 500 MG/50ML IV EMUL
INTRAVENOUS | Status: DC | PRN
  Administered 2017-10-26: 75 ug/kg/min via INTRAVENOUS

## 2017-10-26 MED ORDER — ONDANSETRON HCL 4 MG/2ML IJ SOLN
INTRAMUSCULAR | Status: DC | PRN
  Administered 2017-10-26: 4 mg via INTRAVENOUS

## 2017-10-26 MED ORDER — MIDAZOLAM HCL 5 MG/5ML IJ SOLN
INTRAMUSCULAR | Status: DC | PRN
  Administered 2017-10-26 (×2): 1 mg via INTRAVENOUS

## 2017-10-26 MED ORDER — OXYCODONE-ACETAMINOPHEN 5-325 MG PO TABS: 1.0000 | ORAL_TABLET | Freq: Four times a day (QID) | ORAL | 0 refills | Status: DC | PRN

## 2017-10-26 MED ORDER — 0.9 % SODIUM CHLORIDE (POUR BTL) OPTIME
TOPICAL | Status: DC | PRN
  Administered 2017-10-26: 1000 mL

## 2017-10-26 MED ORDER — CEFAZOLIN SODIUM-DEXTROSE 2-4 GM/100ML-% IV SOLN
2.0000 g | INTRAVENOUS | Status: AC
  Administered 2017-10-26: 2 g via INTRAVENOUS

## 2017-10-26 MED ORDER — FENTANYL CITRATE (PF) 100 MCG/2ML IJ SOLN
INTRAMUSCULAR | Status: DC | PRN
  Administered 2017-10-26: 50 ug via INTRAVENOUS

## 2017-10-26 MED ORDER — LIDOCAINE-EPINEPHRINE 0.5 %-1:200000 IJ SOLN
INTRAMUSCULAR | Status: DC | PRN
  Administered 2017-10-26: 10 mL

## 2017-10-26 SURGICAL SUPPLY — 35 items
ADH SKN CLS APL DERMABOND .7 (GAUZE/BANDAGES/DRESSINGS) ×1
ADH SKN CLS LQ APL DERMABOND (GAUZE/BANDAGES/DRESSINGS) ×1
ARMBAND PINK RESTRICT EXTREMIT (MISCELLANEOUS) ×3 IMPLANT
CANISTER SUCT 3000ML PPV (MISCELLANEOUS) ×3 IMPLANT
CANNULA VESSEL 3MM 2 BLNT TIP (CANNULA) ×3 IMPLANT
CLIP LIGATING EXTRA MED SLVR (CLIP) ×3 IMPLANT
CLIP LIGATING EXTRA SM BLUE (MISCELLANEOUS) ×3 IMPLANT
COVER PROBE W GEL 5X96 (DRAPES) ×5 IMPLANT
DECANTER SPIKE VIAL GLASS SM (MISCELLANEOUS) ×3 IMPLANT
DERMABOND ADHESIVE PROPEN (GAUZE/BANDAGES/DRESSINGS) ×2
DERMABOND ADVANCED (GAUZE/BANDAGES/DRESSINGS) ×2
DERMABOND ADVANCED .7 DNX12 (GAUZE/BANDAGES/DRESSINGS) ×1 IMPLANT
DERMABOND ADVANCED .7 DNX6 (GAUZE/BANDAGES/DRESSINGS) IMPLANT
ELECT REM PT RETURN 9FT ADLT (ELECTROSURGICAL) ×3
ELECTRODE REM PT RTRN 9FT ADLT (ELECTROSURGICAL) ×1 IMPLANT
GLOVE BIO SURGEON STRL SZ 6.5 (GLOVE) ×2 IMPLANT
GLOVE BIO SURGEONS STRL SZ 6.5 (GLOVE) ×2
GLOVE SS BIOGEL STRL SZ 7.5 (GLOVE) ×1 IMPLANT
GLOVE SUPERSENSE BIOGEL SZ 7.5 (GLOVE) ×2
GOWN STRL REUS W/ TWL LRG LVL3 (GOWN DISPOSABLE) ×3 IMPLANT
GOWN STRL REUS W/TWL LRG LVL3 (GOWN DISPOSABLE) ×9
KIT BASIN OR (CUSTOM PROCEDURE TRAY) ×3 IMPLANT
KIT ROOM TURNOVER OR (KITS) ×3 IMPLANT
NS IRRIG 1000ML POUR BTL (IV SOLUTION) ×3 IMPLANT
PACK CV ACCESS (CUSTOM PROCEDURE TRAY) ×3 IMPLANT
PAD ARMBOARD 7.5X6 YLW CONV (MISCELLANEOUS) ×6 IMPLANT
SUT PROLENE 6 0 CC (SUTURE) ×3 IMPLANT
SUT SILK 2 0 SH (SUTURE) IMPLANT
SUT SILK 3 0 (SUTURE) ×3
SUT SILK 3-0 18XBRD TIE 12 (SUTURE) IMPLANT
SUT VIC AB 3-0 SH 27 (SUTURE) ×3
SUT VIC AB 3-0 SH 27X BRD (SUTURE) ×1 IMPLANT
TOWEL GREEN STERILE (TOWEL DISPOSABLE) ×3 IMPLANT
UNDERPAD 30X30 (UNDERPADS AND DIAPERS) ×3 IMPLANT
WATER STERILE IRR 1000ML POUR (IV SOLUTION) ×3 IMPLANT

## 2017-10-26 NOTE — Progress Notes (Signed)
Per CRNA--> pt's insulin pump in standby MODE,per pt prop

## 2017-10-26 NOTE — Op Note (Signed)
    OPERATIVE REPORT  DATE OF SURGERY: 10/26/2017  PATIENT: Daniel SavoyJohn A Wesolowski Jr., 57 y.o. male MRN: [id number]  DOB: 05/19/1961  PRE-OPERATIVE DIAGNOSIS: Chronic renal insufficiency  POST-OPERATIVE DIAGNOSIS:  Same  PROCEDURE: Left first stage brachiobasilic fistula  SURGEON:  Gretta Beganodd Abdulkadir Emmanuel, M.D.  PHYSICIAN ASSISTANT: Darlin CocoSamantha Ryan PA-C  ANESTHESIA: Local with sedation  EBL: Minimal ml  Total I/O In: 300 [I.V.:300] Out: 5 [Blood:5]  BLOOD ADMINISTERED: None  DRAINS: None  SPECIMEN: None  COUNTS CORRECT:  YES  PLAN OF CARE: PACU  PATIENT DISPOSITION:  PACU - hemodynamically stable  PROCEDURE DETAILS: The patient was taken to the operating room placed supine position where the area of the left arm was prepped and draped in usual sterile fashion.  SonoSite ultrasound was used to visualize the veins in the left arm.  The cephalic vein was extremely small.  Filling vein was of a better caliber through the level of the antecubital space.  There were multiple branches at the antecubital space.  A incision was made over the medial aspect at the elbow over a branch of the basilic vein.  There were 2 more superficial branches and then a deeper branch that ran towards the antecubital space.  All 3 of these were mobilized proximally and distally and tributary branches were ligated with 3-0 and 4-0 silk ties and divided.  All 3 of these veins were ligated distally and divided.  The larger had significant scarring from old IVs and therefore was ligated and abandoned.  The larger of the 2 other branches were chosen and was gently dilated with heparinized saline and marked to prevent twisting.  A separate incision was made over the brachial artery and the artery was exposed and was of good caliber.  A tunnel was created between the vein and the brachial artery and the vein was brought through the tunnel.  The artery was occluded proximally distally and was opened with an 11 blade and sent  longitudinally with Potts scissors.  The vein was cut to the appropriate length and was spatulated and sewn end-to-side to the artery with a running 6-0 Prolene suture.  Clamps removed and excellent thrill was noted.  Wounds irrigated with saline.  Hemostasis after cautery.  Wounds were closed with 3-0 Vicryl in the subcutaneous and subcuticular tissue in a sterile dressing was applied   Larina Earthlyodd F. Whitley Patchen, M.D., [facility] 10/26/2017 12:39 PM

## 2017-10-26 NOTE — Transfer of Care (Signed)
Immediate Anesthesia Transfer of Care Note  Patient: Daniel Santos.  Procedure(s) Performed: BASILIC VEIN TRANSPOSITION FIRST STAGE LEFT (Left Arm Lower)  Patient Location: PACU  Anesthesia Type:MAC  Level of Consciousness: awake, alert  and oriented  Airway & Oxygen Therapy: Patient Spontanous Breathing  Post-op Assessment: Report given to RN and Post -op Vital signs reviewed and stable  Post vital signs: Reviewed and stable  Last Vitals:  Vitals Value Taken Time  BP 102/71 10/26/2017 12:13 PM  Temp    Pulse 68 10/26/2017 12:13 PM  Resp 16 10/26/2017 12:13 PM  SpO2 99 % 10/26/2017 12:13 PM  Vitals shown include unvalidated device data.  Last Pain:  Vitals:   10/26/17 0905  TempSrc: Oral      Patients Stated Pain Goal: 4 (08/16/01 4961)  Complications: No apparent anesthesia complications

## 2017-10-26 NOTE — Anesthesia Postprocedure Evaluation (Signed)
Anesthesia Post Note  Patient: Daniel Santos.  Procedure(s) Performed: BASILIC VEIN TRANSPOSITION FIRST STAGE LEFT (Left Arm Lower)     Patient location during evaluation: PACU Anesthesia Type: MAC Level of consciousness: awake Pain management: pain level controlled Vital Signs Assessment: post-procedure vital signs reviewed and stable Respiratory status: spontaneous breathing Cardiovascular status: stable Postop Assessment: no apparent nausea or vomiting Anesthetic complications: no    Last Vitals:  Vitals:   10/26/17 1215 10/26/17 1235  BP: 102/71 127/77  Pulse: 67 69  Resp: 12 13  Temp:    SpO2: 99% 100%    Last Pain:  Vitals:   10/26/17 1212  TempSrc:   PainSc: Asleep   Pain Goal: Patients Stated Pain Goal: 4 (10/26/17 0925)               Brennen Gardiner JR,Usman Mateo Flow

## 2017-10-26 NOTE — Anesthesia Preprocedure Evaluation (Addendum)
Anesthesia Evaluation  Patient identified by MRN, date of birth, ID band Patient awake    Reviewed: Allergy & Precautions, NPO status , Patient's Chart, lab work & pertinent test results, reviewed documented beta blocker date and time   Airway Mallampati: I       Dental no notable dental hx. (+) Teeth Intact   Pulmonary former smoker,    Pulmonary exam normal breath sounds clear to auscultation       Cardiovascular hypertension, Pt. on medications and Pt. on home beta blockers Normal cardiovascular exam Rhythm:Regular Rate:Normal     Neuro/Psych negative psych ROS   GI/Hepatic negative GI ROS, Neg liver ROS,   Endo/Other  diabetes, Insulin Dependent  Renal/GU CRFRenal disease  negative genitourinary   Musculoskeletal   Abdominal Normal abdominal exam  (+)   Peds  Hematology  (+) Blood dyscrasia, anemia ,   Anesthesia Other Findings Dorena Cookey.  ECHO COMPLETE WO IMAGING ENHANCING AGENT  Order# [id number]  Reading physician: Skeet Latch, MD Ordering physician: Edwin Dada, MD Study date: 03/23/17 Study Result   Result status: Final result                             *[facility]*                         [address], [place] [address]                            [phone]  ------------------------------------------------------------------- Transthoracic Echocardiography  Patient:    Daniel Santos, Daniel Santos MR #:       [id number] Study Date: 03/23/2017 Gender:     M Age:        57 Height:     175.3 cm Weight:     60 kg BSA:        1.7 m^2 Pt. Status: Room:       5M01C   ATTENDING    Rai, Ripudeep K  PERFORMING   [facility], Inpatient  ADMITTING    Danford, Christopher P  ORDERING     Danford, Christopher P  REFERRING    Myrene Buddy P  SONOGRAPHER  Batzaya  [place]  cc:  ------------------------------------------------------------------- LV EF: 60% -   65%     Reproductive/Obstetrics                            Anesthesia Physical Anesthesia Plan  ASA: III  Anesthesia Plan: MAC   Post-op Pain Management:    Induction:   PONV Risk Score and Plan: 2 and Treatment may vary due to age or medical condition and Ondansetron  Airway Management Planned: Natural Airway, Simple Face Mask and Nasal Cannula  Additional Equipment:   Intra-op Plan:   Post-operative Plan:   Informed Consent: I have reviewed the patients History and Physical, chart, labs and discussed the procedure including the risks, benefits and alternatives for the proposed anesthesia with the patient or authorized representative who has indicated his/her understanding and acceptance.     Plan Discussed with: CRNA and  Surgeon  Anesthesia Plan Comments:         Anesthesia Quick Evaluation

## 2017-10-26 NOTE — Anesthesia Procedure Notes (Signed)
Procedure Name: MAC Date/Time: 10/26/2017 10:23 AM Performed by: Candis Shine, CRNA Pre-anesthesia Checklist: Patient identified, Emergency Drugs available, Suction available, Timeout performed and Patient being monitored Patient Re-evaluated:Patient Re-evaluated prior to induction Oxygen Delivery Method: Simple face mask Dental Injury: Teeth and Oropharynx as per pre-operative assessment

## 2017-10-26 NOTE — H&P (Signed)
Office Visit   09/24/2017 [facility] -Rennis Chris, MD    Vascular Surgery   Chronic renal insufficiency, stage 4 (severe) [facility])    Dx   New Patient (Initial Visit)   ; Referred by Truman Hayward, FNP    Reason for Visit     Additional Documentation   Vitals:   BP 139/80 (BP Location: Left Arm, Patient Position: Sitting, Cuff Size: Normal)    Pulse 75    Temp 97.8 F (36.6 C) (Oral)    Resp 16    Ht 5\' 9"  (1.753 m)    Wt 136 lb (61.7 kg)    SpO2 97%    BMI 20.08 kg/m    BSA 1.73 m    Flowsheets:   Clinical Intake,    Healthcare Directives,    Vital Signs,    MEWS Score,    Anthropometrics      Encounter Info:   Billing Info,    History,    Allergies,    Detailed Report       All Notes    Progress Notes by Nada Libman, MD at 09/24/2017 1:30 PM   Author: Nada Libman, MD Author Type: Physician Filed: 09/24/2017 2:57 PM  Note Status: Signed Cosign: Cosign Not Required Encounter Date: 09/24/2017  Editor: Nada Libman, MD (Physician)                                       [facility]  Patient name: Daniel Santos.       MRN: [id number]        DOB: 1960-08-27            Sex: male   REQUESTING PROVIDER:    Dr. Arrie Aran   REASON FOR CONSULT:    ESRD  HISTORY OF PRESENT ILLNESS:   Daniel Santos. is a 57 y.o. male, who is referred for evaluation of dialysis access.  He is right handed.  His renal failure is secondary to diabetes and hypertension.  He has a history of stroke in the past.  He is on dual antiplatelet therapy.  He is a chronic smoker.  He takes a statin for hypercholesterolemia.  PAST MEDICAL HISTORY        Past Medical History:  Diagnosis Date  . Atypical chest pain 12/14/2015  . Diabetes mellitus without complication (HCC)   . Hemiparesis and alteration of sensations as late effects of stroke (HCC) 03/25/2015    . History of non-insulin dependent diabetes mellitus   . Hypercholesteremia   . Hypertension   . Palpitations 12/14/2015  . Renal disorder      FAMILY HISTORY        Family History  Problem Relation Age of Onset  . Cancer Mother   . Leukemia Paternal Uncle   . Stroke Paternal Grandmother   . Diabetes Paternal Grandmother   . Breast cancer Paternal Grandmother   . Kidney disease Father   . Diabetes Paternal Aunt        x2  . Breast cancer Maternal Grandmother     SOCIAL HISTORY:   Social History        Socioeconomic History  . Marital status: Single    Spouse name: Not on file  . Number of children: 3  . Years of education: 27  .  Highest education level: Not on file  Social Needs  . Financial resource strain: Not on file  . Food insecurity - worry: Not on file  . Food insecurity - inability: Not on file  . Transportation needs - medical: Not on file  . Transportation needs - non-medical: Not on file  Occupational History  . Not on file  Tobacco Use  . Smoking status: Former Smoker    Packs/day: 1.00    Types: Cigarettes    Last attempt to quit: 06/06/2017    Years since quitting: 0.3  . Smokeless tobacco: Never Used  Substance and Sexual Activity  . Alcohol use: No  . Drug use: No  . Sexual activity: Not on file  Other Topics Concern  . Not on file  Social History Narrative   Patient drinks about 2 cups of caffeine daily.   Patient is right handed.    ALLERGIES:         Allergies  Allergen Reactions  . Doxycycline Nausea Only    (kidney function?)    CURRENT MEDICATIONS:          Current Outpatient Medications  Medication Sig Dispense Refill  . amLODipine (NORVASC) 10 MG tablet Take 10 mg by mouth every evening.     Marland Kitchen aspirin EC 325 MG EC tablet Take 1 tablet (325 mg total) by mouth daily. 30 tablet 4  . atorvastatin (LIPITOR) 40 MG tablet Take 1 tablet (40 mg total) every evening by mouth. 30  tablet 2  . BUTRANS 20 MCG/HR PTWK patch Place 20 mcg onto the skin once a week. On Sunday    . calcitRIOL (ROCALTROL) 0.25 MCG capsule Take 0.25 mcg by mouth every Monday, Wednesday, and Friday.     . carvedilol (COREG) 25 MG tablet Take 1 tablet (25 mg total) by mouth 2 (two) times daily with a meal. 60 tablet 11  . clopidogrel (PLAVIX) 75 MG tablet Take 1 tablet (75 mg total) by mouth daily. (Patient taking differently: Take 75 mg by mouth every evening. ) 30 tablet 0  . ferrous sulfate 325 (65 FE) MG tablet Take 325 mg by mouth every evening.     . furosemide (LASIX) 40 MG tablet Take 40 mg by mouth.    . Insulin Human (INSULIN PUMP) SOLN Inject into the skin. Humulog Insulin Pump    . LINZESS 145 MCG CAPS capsule Take 145 mcg by mouth daily as needed. constipation     No current facility-administered medications for this visit.     REVIEW OF SYSTEMS:   [X]  denotes positive finding, [ ]  denotes negative finding Cardiac  Comments:  Chest pain or chest pressure:    Shortness of breath upon exertion:    Short of breath when lying flat:    Irregular heart rhythm:        Vascular    Pain in calf, thigh, or hip brought on by ambulation:    Pain in feet at night that wakes you up from your sleep:     Blood clot in your veins:    Leg swelling:  x       Pulmonary    Oxygen at home:    Productive cough:     Wheezing:         Neurologic    Sudden weakness in arms or legs:     Sudden numbness in arms or legs:     Sudden onset of difficulty speaking or slurred speech:    Temporary loss of  vision in one eye:     Problems with dizziness:         Gastrointestinal    Blood in stool:      Vomited blood:         Genitourinary    Burning when urinating:     Blood in urine:        Psychiatric    Major depression:         Hematologic    Bleeding problems:    Problems with blood clotting  too easily:        Skin    Rashes or ulcers:        Constitutional    Fever or chills:     PHYSICAL EXAM:      Vitals:   09/24/17 1401  BP: 139/80  Pulse: 75  Resp: 16  Temp: 97.8 F (36.6 C)  TempSrc: Oral  SpO2: 97%  Weight: 136 lb (61.7 kg)  Height: 5\' 9"  (1.753 m)    GENERAL: The patient is a well-nourished male, in no acute distress. The vital signs are documented above. CARDIAC: There is a regular rate and rhythm.  VASCULAR: palpable left radial pulse PULMONARY: Nonlabored respirations MUSCULOSKELETAL: There are no major deformities or cyanosis. SKIN: There are no ulcers or rashes noted. PSYCHIATRIC: The patient has a normal affect.  STUDIES:   I have reviewed his vascular lab studies.  He appears to have an adequate left basilic vein.  The left cephalic vein does not appear to be adequate.  His arterial study was normal  ASSESSMENT and PLAN   Chronic renal insufficiency: I discussed proceeding with a staged basilic vein fistula creation.  He will need to be off of his Plavix.  The patient has questions concerning whether or not he should do peritoneal or hemodialysis.  He is scheduled to see Dr. Arrie Aranoladonato in approximately 1 month.  He will have further discussions at that time and then contact me once he has made a decision to proceed with access.   Durene CalWells Brabham, MD [facility] Tel [phone] Pager [phone]       Addendum:  The patient has been re-examined and re-evaluated.  The patient's history and physical has been reviewed and is unchanged.    Daniel SavoyJohn A Shi Jr. is a 57 y.o. male is being admitted with CHRONIC KIDNEY DISEASE STAGE IV FOR HEMODIALYSIS ACCESS. All the risks, benefits and other treatment options have been discussed with the patient. The patient has consented to proceed with Procedure(s): BASILIC VEIN TRANSPOSITION FIRST STAGE LEFT as a surgical intervention.  Milley Vining 10/26/2017 10:00 AM [facility]

## 2017-10-26 NOTE — Discharge Instructions (Signed)
[facility]  Discharge Instructions  AV Fistula or Graft Surgery for Dialysis Access  Please refer to the following instructions for your post-procedure care. Your surgeon or physician assistant will discuss any changes with you.  Activity  You may drive the day following your surgery, if you are comfortable and no longer taking prescription pain medication. Resume full activity as the soreness in your incision resolves.  Bathing/Showering  You may shower after you go home. Keep your incision dry for 48 hours. Do not soak in a bathtub, hot tub, or swim until the incision heals completely. You may not shower if you have a hemodialysis catheter.  Incision Care  Clean your incision with mild soap and water after 48 hours. Pat the area dry with a clean towel. You do not need a bandage unless otherwise instructed. Do not apply any ointments or creams to your incision. You may have skin glue on your incision. Do not peel it off. It will come off on its own in about one week. Your arm may swell a bit after surgery. To reduce swelling use pillows to elevate your arm so it is above your heart. Your doctor will tell you if you need to lightly wrap your arm with an ACE bandage.  Diet  Resume your normal diet. There are not special food restrictions following this procedure. In order to heal from your surgery, it is CRITICAL to get adequate nutrition. Your body requires vitamins, minerals, and protein. Vegetables are the best source of vitamins and minerals. Vegetables also provide the perfect balance of protein. Processed food has little nutritional value, so try to avoid this.  Medications  Resume taking all of your medications. If your incision is causing pain, you may take over-the counter pain relievers such as acetaminophen (Tylenol). If you were prescribed a stronger pain medication, please be aware these medications can cause nausea and constipation. Prevent  nausea by taking the medication with a snack or meal. Avoid constipation by drinking plenty of fluids and eating foods with high amount of fiber, such as fruits, vegetables, and grains.  Do not take Tylenol if you are taking prescription pain medications.  Follow up Your surgeon may want to see you in the office following your access surgery. If so, this will be arranged at the time of your surgery.  Please call Daniel Santos immediately for any of the following conditions:  Increased pain, redness, drainage (pus) from your incision site Fever of 101 degrees or higher Severe or worsening pain at your incision site Hand pain or numbness.  Reduce your risk of vascular disease:  Stop smoking. If you would like help, call Daniel Santos at 1-800-QUIT-NOW (80202574101-719-796-2430) or Daniel Santos at [phone]  Manage your cholesterol Maintain a desired weight Control your diabetes Keep your blood pressure down  Dialysis  It will take several weeks to several months for your new dialysis access to be ready for use. Your surgeon will determine when it is okay to use it. Your nephrologist will continue to direct your dialysis. You can continue to use your Permcath until your new access is ready for use.   10/26/2017 Daniel SavoyJohn A Glogowski Jr. [id number] 01/21/1961  Surgeon(s): Early, Kristen Loaderodd F, MD  Procedure(s): BASILIC VEIN TRANSPOSITION FIRST STAGE LEFT  x Do not stick fistula for 12 weeks    If you have any questions, please call the office at [phone].   Post Anesthesia Home Care Instructions  Activity: Get plenty of rest for the  remainder of the day. A responsible individual must stay with you for 24 hours following the procedure.  For the next 24 hours, DO NOT: -Drive a car -Paediatric nurse -Drink alcoholic beverages -Take any medication unless instructed by your physician -Make any legal decisions or sign important papers.  Meals: Start with liquid foods such as gelatin or soup. Progress to  regular foods as tolerated. Avoid greasy, spicy, heavy foods. If nausea and/or vomiting occur, drink only clear liquids until the nausea and/or vomiting subsides. Call your physician if vomiting continues.  Special Instructions/Symptoms: Your throat may feel dry or sore from the anesthesia or the breathing tube placed in your throat during surgery. If this causes discomfort, gargle with warm salt water. The discomfort should disappear within 24 hours.  If you had a scopolamine patch placed behind your ear for the management of post- operative nausea and/or vomiting:  1. The medication in the patch is effective for 72 hours, after which it should be removed.  Wrap patch in a tissue and discard in the trash. Wash hands thoroughly with soap and water. 2. You may remove the patch earlier than 72 hours if you experience unpleasant side effects which may include dry mouth, dizziness or visual disturbances. 3. Avoid touching the patch. Wash your hands with soap and water after contact with the patch.

## 2017-10-27 ENCOUNTER — Encounter (HOSPITAL_COMMUNITY): Payer: Self-pay | Admitting: Vascular Surgery

## 2017-10-30 ENCOUNTER — Telehealth: Payer: Self-pay | Admitting: Vascular Surgery

## 2017-10-30 NOTE — Telephone Encounter (Signed)
Confirmed 12/11/17 appt with pt. Mailed letter. °

## 2017-10-30 NOTE — Telephone Encounter (Signed)
-----   Message from Sharee PimpleMarilyn K McChesney, RN sent at 10/26/2017  4:45 PM EDT ----- Regarding: 4-6 weeks with Dr. Arbie CookeyEarly to schedule surgery   ----- Message ----- From: Dara Lordshyne, Samantha J, PA-C Sent: 10/26/2017  11:57 AM To: [facility]vs Charge Pool  S/p left 1st stage BVT 10/26/17.  F/u with Dr. Arbie CookeyEarly in 4-6 weeks with duplex.  Needs to be on his schedule as he will need 2nd surgery.  Thanks

## 2017-11-14 ENCOUNTER — Telehealth: Payer: Self-pay | Admitting: *Deleted

## 2017-11-14 NOTE — Telephone Encounter (Signed)
Patient called c/o "knot at fistula site right at the bend of my arm" Denies any heart or redness. No complaints of pain voiced.  Continues to have some swelling, but healing well according to patient. Instructed him to elevate arm above the level of his heart and exercise fingers. Agreeable to see NP on Friday 4/12.

## 2017-11-16 ENCOUNTER — Ambulatory Visit (INDEPENDENT_AMBULATORY_CARE_PROVIDER_SITE_OTHER): Payer: Self-pay | Admitting: Family

## 2017-11-16 ENCOUNTER — Encounter: Payer: Self-pay | Admitting: Family

## 2017-11-16 ENCOUNTER — Other Ambulatory Visit: Payer: Self-pay | Admitting: *Deleted

## 2017-11-16 ENCOUNTER — Encounter: Payer: Self-pay | Admitting: *Deleted

## 2017-11-16 VITALS — BP 134/74 | HR 69 | Temp 97.4°F | Resp 16 | Ht 69.0 in | Wt 142.0 lb

## 2017-11-16 DIAGNOSIS — N184 Chronic kidney disease, stage 4 (severe): Secondary | ICD-10-CM

## 2017-11-16 DIAGNOSIS — I770 Arteriovenous fistula, acquired: Secondary | ICD-10-CM

## 2017-11-16 DIAGNOSIS — T148XXA Other injury of unspecified body region, initial encounter: Secondary | ICD-10-CM

## 2017-11-16 NOTE — Progress Notes (Signed)
Postoperative Access Visit   History of Present Illness  Daniel Santos. is a 57 y.o. year old male who is s/p left first stage brachiobasilic fistula creation on  01-08-53 by Dr. Arbie Cookey.  He is right handed.  His renal failure is secondary to diabetes and hypertension.  He has a history of stroke in the past.  He is on dual antiplatelet therapy.  He is a chronic smoker.  He takes a statin for hypercholesterolemia.  He has an appointment on 12-11-17 with Dr. Arbie Cookey for 4-6 week post op follow up.   He returns today with c/o "lump" forming in the last few days, at his left antecubital area.  He denies steal sx's in his left hand, denies pain.  He has full use of his left hand. Left arm incisions are well healed. The patient is able to complete their activities of daily living.    Pt states his nephrologist told him his kidneys are functioning around 16%.  He has not yet started dialysis.   His A1C was 7.7 on 10-26-17, he is on an insulin pump.    For VQI Use Only  PRE-ADM LIVING: Home  AMB STATUS: Ambulatory   Past Medical History:  Diagnosis Date  . Atypical chest pain 12/14/2015  . Diabetes mellitus without complication (HCC)   . 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 History of non-insulin dependent diabetes mellitus   . Hypercholesteremia   . Hypertension   . Palpitations 12/14/2015  . Renal disorder   . Stroke [facility])     Past Surgical History:  Procedure Laterality Date  . BACK SURGERY    . BASCILIC VEIN TRANSPOSITION Left 10/26/2017   Procedure: BASILIC VEIN TRANSPOSITION FIRST STAGE LEFT;  Surgeon: Larina Earthly, MD;  Location: [facility] OR;  Service: Vascular;  Laterality: Left;  . LUMBAR SPINE SURGERY  2005, 2008, 2010  . REPAIR OF FRACTURED PENIS  05/2000   with cystoscopy    Social History   Socioeconomic History  . Marital status: Single    Spouse name: Not on file  . Number of children: 3  . Years of education:  75  . Highest education level: Not on file  Occupational History  . Not on file  Social Needs  . Financial resource strain: Not on file  . Food insecurity:    Worry: Not on file    Inability: Not on file  . Transportation needs:    Medical: Not on file    Non-medical: Not on file  Tobacco Use  . Smoking status: Former Smoker    Packs/day: 1.00    Types: Cigarettes    Last attempt to quit: 06/06/2017    Years since quitting: 0.4  . Smokeless tobacco: Never Used  Substance and Sexual Activity  . Alcohol use: No  . Drug use: No  . Sexual activity: Not on file  Lifestyle  . Physical activity:    Days per week: Not on file    Minutes per session: Not on file  . Stress: Not on file  Relationships  . Social connections:    Talks on phone: Not on file    Gets together: Not on file    Attends religious service: Not on file    Active member of club or organization: Not on file    Attends meetings of clubs or organizations: Not on file    Relationship status: Not on file  . Intimate  partner violence:    Fear of current or ex partner: Not on file    Emotionally abused: Not on file    Physically abused: Not on file    Forced sexual activity: Not on file  Other Topics Concern  . Not on file  Social History Narrative   Patient drinks about 2 cups of caffeine daily.   Patient is right handed.    Allergies  Allergen Reactions  . Doxycycline Nausea Only and Other (See Comments)    GI upset, kidney function    Current Outpatient Medications on File Prior to Visit  Medication Sig Dispense Refill  . amLODipine (NORVASC) 10 MG tablet Take 10 mg by mouth at bedtime.     Marland Kitchen aspirin EC 325 MG EC tablet Take 1 tablet (325 mg total) by mouth daily. 30 tablet 4  . atorvastatin (LIPITOR) 40 MG tablet Take 1 tablet (40 mg total) every evening by mouth. 30 tablet 2  . BUTRANS 20 MCG/HR PTWK patch Place 20 mcg onto the skin every Sunday.     . calcitRIOL (ROCALTROL) 0.25 MCG capsule Take  0.25 mcg by mouth every Monday, Wednesday, and Friday.     . carvedilol (COREG) 25 MG tablet Take 1 tablet (25 mg total) by mouth 2 (two) times daily with a meal. 60 tablet 11  . clopidogrel (PLAVIX) 75 MG tablet Take 1 tablet (75 mg total) by mouth daily. 30 tablet 0  . CONTOUR NEXT TEST test strip     . ferrous sulfate 325 (65 FE) MG tablet Take 325 mg by mouth at bedtime.     . furosemide (LASIX) 40 MG tablet     . insulin lispro (HUMALOG) 100 UNIT/ML injection Inject 2-10 Units into the skin See admin instructions. Uses via insulin pump. Basal 2 units per hour, bolus 10 units. Approximately 4 units per meal.    . LINZESS 145 MCG CAPS capsule Take 145 mcg by mouth daily as needed (for constipation).     . neomycin-bacitracin-polymyxin (NEOSPORIN) ointment Apply 1 application topically as needed for wound care.    Marland Kitchen oxyCODONE-acetaminophen (PERCOCET) 5-325 MG tablet Take 1 tablet by mouth every 6 (six) hours as needed for severe pain. 8 tablet 0   No current facility-administered medications on file prior to visit.      Physical Examination Vitals:   11/16/17 1316  BP: 134/74  Pulse: 69  Resp: 16  Temp: (!) 97.4 F (36.3 C)  TempSrc: Oral  SpO2: 99%  Weight: 142 lb (64.4 kg)  Height: 5\' 9"  (1.753 m)   Body mass index is 20.97 kg/m.   PHYSICAL EXAMINATION: General: The patient appears his stated age.   HEENT:  No gross abnormalities Pulmonary: Respirations are non-labored Abdomen: Soft and non-tender  Musculoskeletal: There are no major deformities.   Neurologic: No focal weakness or paresthesias are detected, Skin: There are no ulcer or rashes noted. Psychiatric: The patient has normal affect. Cardiovascular: There is a regular rate and rhythm Vascular: Hematoma left antecubital space. Antecubital incision is well healed, skin feels warm and normal, hand grip is 5/5, sensation in digits is intact, palpable thrill, bruit can be auscultated     Left arm antecubital  space   Medical Decision Making  [name]. is a 57 y.o. year old male who presents s/p left first stage brachiobasilic fistula creation on  1-61-09. A hematoma has formed at his left antecubital space. He takes Plavix, no ASA, no anticoagulant.  Neurovascular status is  intact left hand.  Dr. Darrick PennaFields spoke with and examined pt. Will schedule evacuation of hematoma of left antecubital space on 11-21-17 by Dr. Darrick PennaFields.     Charisse MarchSuzanne Trecia Maring, RN, MSN, FNP-C [facility] Office: [phone]  11/16/2017, 1:38 PM  Clinic MD: Darrick PennaFields

## 2017-11-16 NOTE — H&P (View-Only) (Signed)
Postoperative Access Visit   History of Present Illness  Daniel Eisenhour. is a 57 y.o. year old male who is s/p left first stage brachiobasilic fistula creation on  01-08-53 by Dr. Arbie Cookey.  He is right handed.  His renal failure is secondary to diabetes and hypertension.  He has a history of stroke in the past.  He is on dual antiplatelet therapy.  He is a chronic smoker.  He takes a statin for hypercholesterolemia.  He has an appointment on 12-11-17 with Dr. Arbie Cookey for 4-6 week post op follow up.   He returns today with c/o "lump" forming in the last few days, at his left antecubital area.  He denies steal sx's in his left hand, denies pain.  He has full use of his left hand. Left arm incisions are well healed. The patient is able to complete their activities of daily living.    Pt states his nephrologist told him his kidneys are functioning around 16%.  He has not yet started dialysis.   His A1C was 7.7 on 10-26-17, he is on an insulin pump.    For VQI Use Only  PRE-ADM LIVING: Home  AMB STATUS: Ambulatory   Past Medical History:  Diagnosis Date  . Atypical chest pain 12/14/2015  . Diabetes mellitus without complication (HCC)   . 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 History of non-insulin dependent diabetes mellitus   . Hypercholesteremia   . Hypertension   . Palpitations 12/14/2015  . Renal disorder   . Stroke [facility])     Past Surgical History:  Procedure Laterality Date  . BACK SURGERY    . BASCILIC VEIN TRANSPOSITION Left 10/26/2017   Procedure: BASILIC VEIN TRANSPOSITION FIRST STAGE LEFT;  Surgeon: Larina Earthly, MD;  Location: [facility] OR;  Service: Vascular;  Laterality: Left;  . LUMBAR SPINE SURGERY  2005, 2008, 2010  . REPAIR OF FRACTURED PENIS  05/2000   with cystoscopy    Social History   Socioeconomic History  . Marital status: Single    Spouse name: Not on file  . Number of children: 3  . Years of education:  75  . Highest education level: Not on file  Occupational History  . Not on file  Social Needs  . Financial resource strain: Not on file  . Food insecurity:    Worry: Not on file    Inability: Not on file  . Transportation needs:    Medical: Not on file    Non-medical: Not on file  Tobacco Use  . Smoking status: Former Smoker    Packs/day: 1.00    Types: Cigarettes    Last attempt to quit: 06/06/2017    Years since quitting: 0.4  . Smokeless tobacco: Never Used  Substance and Sexual Activity  . Alcohol use: No  . Drug use: No  . Sexual activity: Not on file  Lifestyle  . Physical activity:    Days per week: Not on file    Minutes per session: Not on file  . Stress: Not on file  Relationships  . Social connections:    Talks on phone: Not on file    Gets together: Not on file    Attends religious service: Not on file    Active member of club or organization: Not on file    Attends meetings of clubs or organizations: Not on file    Relationship status: Not on file  . Intimate  partner violence:    Fear of current or ex partner: Not on file    Emotionally abused: Not on file    Physically abused: Not on file    Forced sexual activity: Not on file  Other Topics Concern  . Not on file  Social History Narrative   Patient drinks about 2 cups of caffeine daily.   Patient is right handed.    Allergies  Allergen Reactions  . Doxycycline Nausea Only and Other (See Comments)    GI upset, kidney function    Current Outpatient Medications on File Prior to Visit  Medication Sig Dispense Refill  . amLODipine (NORVASC) 10 MG tablet Take 10 mg by mouth at bedtime.     Marland Kitchen aspirin EC 325 MG EC tablet Take 1 tablet (325 mg total) by mouth daily. 30 tablet 4  . atorvastatin (LIPITOR) 40 MG tablet Take 1 tablet (40 mg total) every evening by mouth. 30 tablet 2  . BUTRANS 20 MCG/HR PTWK patch Place 20 mcg onto the skin every Sunday.     . calcitRIOL (ROCALTROL) 0.25 MCG capsule Take  0.25 mcg by mouth every Monday, Wednesday, and Friday.     . carvedilol (COREG) 25 MG tablet Take 1 tablet (25 mg total) by mouth 2 (two) times daily with a meal. 60 tablet 11  . clopidogrel (PLAVIX) 75 MG tablet Take 1 tablet (75 mg total) by mouth daily. 30 tablet 0  . CONTOUR NEXT TEST test strip     . ferrous sulfate 325 (65 FE) MG tablet Take 325 mg by mouth at bedtime.     . furosemide (LASIX) 40 MG tablet     . insulin lispro (HUMALOG) 100 UNIT/ML injection Inject 2-10 Units into the skin See admin instructions. Uses via insulin pump. Basal 2 units per hour, bolus 10 units. Approximately 4 units per meal.    . LINZESS 145 MCG CAPS capsule Take 145 mcg by mouth daily as needed (for constipation).     . neomycin-bacitracin-polymyxin (NEOSPORIN) ointment Apply 1 application topically as needed for wound care.    Marland Kitchen oxyCODONE-acetaminophen (PERCOCET) 5-325 MG tablet Take 1 tablet by mouth every 6 (six) hours as needed for severe pain. 8 tablet 0   No current facility-administered medications on file prior to visit.      Physical Examination Vitals:   11/16/17 1316  BP: 134/74  Pulse: 69  Resp: 16  Temp: (!) 97.4 F (36.3 C)  TempSrc: Oral  SpO2: 99%  Weight: 142 lb (64.4 kg)  Height: 5\' 9"  (1.753 m)   Body mass index is 20.97 kg/m.   PHYSICAL EXAMINATION: General: The patient appears his stated age.   HEENT:  No gross abnormalities Pulmonary: Respirations are non-labored Abdomen: Soft and non-tender  Musculoskeletal: There are no major deformities.   Neurologic: No focal weakness or paresthesias are detected, Skin: There are no ulcer or rashes noted. Psychiatric: The patient has normal affect. Cardiovascular: There is a regular rate and rhythm Vascular: Hematoma left antecubital space. Antecubital incision is well healed, skin feels warm and normal, hand grip is 5/5, sensation in digits is intact, palpable thrill, bruit can be auscultated     Left arm antecubital  space   Medical Decision Making  Daniel Klaus. is a 57 y.o. year old male who presents s/p left first stage brachiobasilic fistula creation on  1-61-09. A hematoma has formed at his left antecubital space. He takes Plavix, no ASA, no anticoagulant.  Neurovascular status is  intact left hand.  Dr. Darrick PennaFields spoke with and examined pt. Will schedule evacuation of hematoma of left antecubital space on 11-21-17 by Dr. Darrick PennaFields.     Charisse MarchSuzanne Nickel, RN, MSN, FNP-C [facility] [place] Office: [phone]  11/16/2017, 1:38 PM  Clinic MD: Darrick PennaFields

## 2017-11-19 ENCOUNTER — Other Ambulatory Visit: Payer: Self-pay

## 2017-11-19 ENCOUNTER — Encounter (HOSPITAL_COMMUNITY): Payer: Self-pay | Admitting: *Deleted

## 2017-11-19 NOTE — Progress Notes (Addendum)
Mr Daniel Santos is upset that surgery time has been changed. Patient has Type I diabetes, endocrinologist is Dr Horald PollenBalen.  I encouraged patient to call Dr Horald PollenBalen for insulin pump instruction while NPO. Patient states that last time he had surgery- 10/26/17, " it was about perfect, and I didn't change it at all."  Patient reported later that  cbg had dropped and that he took 4 glucose tablets."  I instructed patient that if CBG is < 70 to eat 4 glucose tablets and check cbg in 15 minutes and call pre op desk.  Mr Daniel Santos also reports that he is still taking Aspirin 325.  I read him Dr Patrecia PaceWills not from last office visit that stated for  patient  To stop Aspirin and continue Plavix.  I sent a fax to Dr Horald PollenBalen requesting last office note, labs and patient instructions for Insulin pump rates when patient NPO.  I asked anesthesia PA/NP to  review chart.

## 2017-11-20 ENCOUNTER — Telehealth: Payer: Self-pay | Admitting: *Deleted

## 2017-11-20 NOTE — Telephone Encounter (Signed)
Spoke with patient and confirmed that [facility]30 am 11/21/17. NPO past MN. Must have driver for home.

## 2017-11-20 NOTE — Telephone Encounter (Signed)
Left msg on voice mail to instruct patient to [facility] 6:30 am for surgery. Schedule changed by Dr. Darrick PennaFields. [facility] aware of time change. Requested patient to call this office back to confirm time change.

## 2017-11-20 NOTE — Anesthesia Preprocedure Evaluation (Addendum)
Anesthesia Evaluation  Patient identified by MRN, date of birth, ID band Patient awake    Reviewed: Allergy & Precautions, Patient's Chart, lab work & pertinent test results, reviewed documented beta blocker date and time   Airway Mallampati: I       Dental no notable dental hx. (+) Teeth Intact   Pulmonary former smoker,    Pulmonary exam normal breath sounds clear to auscultation       Cardiovascular hypertension, Pt. on medications and Pt. on home beta blockers Normal cardiovascular exam Rhythm:Regular Rate:Normal  ejection fraction was in the range of 60% to 65%. Wall   motion was normal; there were no regional wall motion   abnormalities.   Neuro/Psych CVA negative psych ROS   GI/Hepatic negative GI ROS, Neg liver ROS,   Endo/Other  diabetes, Insulin Dependent  Renal/GU CRFRenal disease  negative genitourinary   Musculoskeletal   Abdominal Normal abdominal exam  (+)   Peds  Hematology  (+) Blood dyscrasia, anemia ,   Anesthesia Other Findings Dorena Cookey.  ECHO COMPLETE WO IMAGING ENHANCING AGENT  Order# [id number]  Reading physician: Skeet Latch, MD Ordering physician: Edwin Dada, MD Study date: 03/23/17 Study Result   Result status: Final result                             *[facility]*                         [address], [place] [address]                            [phone]  ------------------------------------------------------------------- Transthoracic Echocardiography  Patient:    Fillmore, Bynum MR #:       [id number] Study Date: 03/23/2017 Gender:     M Age:        57 Height:     175.3 cm Weight:     60 kg BSA:        1.7 m^2 Pt. Status: Room:       5M01C   ATTENDING    Rai, Ripudeep K  PERFORMING   [facility], Inpatient  ADMITTING    Danford, Christopher P  ORDERING     Danford,  Christopher P  REFERRING    Myrene Buddy P  SONOGRAPHER  Batzaya Batchuluun  cc:  ------------------------------------------------------------------- LV EF: 60% -   65%     Reproductive/Obstetrics                             Anesthesia Physical Anesthesia Plan  ASA: IV  Anesthesia Plan: MAC   Post-op Pain Management:    Induction: Intravenous  PONV Risk Score and Plan: Treatment may vary due to age or medical condition  Airway Management Planned: Mask, Natural Airway and Nasal Cannula  Additional Equipment:   Intra-op Plan:   Post-operative Plan:   Informed Consent: I have reviewed the patients History and Physical, chart, labs and discussed the procedure including the risks, benefits and alternatives for the proposed anesthesia with the patient or authorized  representative who has indicated his/her understanding and acceptance.   Dental advisory given  Plan Discussed with: CRNA  Anesthesia Plan Comments:        Anesthesia Quick Evaluation

## 2017-11-20 NOTE — Progress Notes (Signed)
Anesthesia Chart Review:  Pt is a same day work up.   Pt is a 57 year old male scheduled for evacuation hematoma L antecubital s/p access creation site on 11/21/2017 with Fabienne Brunsharles Fields, MD.   Providers:  - PCP is Malachy Chamberakia Starkes, NP - Nephrologist is Terrial RhodesJoseph Coladonato, MD - Cardiologist is Chilton Siiffany Glenwood, MD. Last office visit 04/03/17 - Endocrinologist is Dorisann FramesBindubal Balan, MD.    PMH includes:  Stroke (last 06/2017), HTN, type 1 DM (on insulin pump), hyperlipidemia, CKD (not yet on dialysis). Former smoker (quit 06/06/17). BMI 21. S/p 1st stage basilic vein transposition 10/26/17  - Hospitalized 11/3-11/4/18 for acute CVA  - Hospitalized 8/16-8/18/18 for acute CVA  Medications include: Amlodipine, ASA 325 mg, Lipitor, Butrans, carvedilol, Plavix, iron, Lasix, Humalog (on a pump).  - Pt instructed to reach out to Dr. Talmage NapBalan for guidance on managing insulin pump perioperatively.  - Pt to hold plavix 2 days before surgery  Labs will be obtained day of surgery. - HbA1c was 7.7 on 10/26/17  EKG 06/09/17: NSR. Biatrial enlargement  Echo 03/23/17:  - Left ventricle: The cavity size was normal. There was mild concentric hypertrophy. Systolic function was normal. The estimated ejection fraction was in the range of 60% to 65%. Wall motion was normal; there were no regional wall motion abnormalities. Left ventricular diastolic function parameters were normal. - Aortic valve: Transvalvular velocity was within the normal range. There was no stenosis. There was no regurgitation. - Mitral valve: Transvalvular velocity was within the normal range. There was no evidence for stenosis. There was trivial regurgitation. - Right ventricle: The cavity size was normal. Wall thickness was normal. Systolic function was normal. - Tricuspid valve: There was moderate regurgitation. - Pulmonary arteries: Systolic pressure was mildly increased. PA peak pressure: 40 mm Hg (S).  Carotid duplex 03/23/17:  - The vertebral  arteries appear patent with antegrade flow. - Findings consistent with a 1- 39 percent cerebrovascular diseaseinvolving the right internal carotid artery and the left internalcarotid artery.  Nuclear stress test 01/06/16:  The left ventricular ejection fraction is mildly decreased (45-54%).  Nuclear stress EF: 51%.  There was no ST segment deviation noted during stress.  The study is normal.  This is a low risk study. - Normal resting and stress perfusion EF 51%  - EF appears normal visually with no RWMA;s  - Suggest echo correlation   If labs acceptable day of surgery, I anticipate pt can proceed with surgery as scheduled.   Rica Mastngela Dwayn Moravek, FNP-BC [facility]Anesthesiology Phone: [phone] 11/20/2017 10:02 AM

## 2017-11-21 ENCOUNTER — Ambulatory Visit (HOSPITAL_COMMUNITY): Payer: BLUE CROSS/BLUE SHIELD | Admitting: Emergency Medicine

## 2017-11-21 ENCOUNTER — Telehealth: Payer: Self-pay | Admitting: Vascular Surgery

## 2017-11-21 ENCOUNTER — Ambulatory Visit (HOSPITAL_COMMUNITY)
Admission: RE | Admit: 2017-11-21 | Discharge: 2017-11-21 | Disposition: A | Discharge status: Home / Self Care | Payer: BLUE CROSS/BLUE SHIELD | Source: Ambulatory Visit | Attending: Vascular Surgery | Admitting: Vascular Surgery

## 2017-11-21 ENCOUNTER — Encounter (HOSPITAL_COMMUNITY): Payer: Self-pay | Admitting: Anesthesiology

## 2017-11-21 ENCOUNTER — Encounter (HOSPITAL_COMMUNITY)
Admission: RE | Disposition: A | Discharge status: Home / Self Care | Payer: Self-pay | Source: Ambulatory Visit | Attending: Vascular Surgery

## 2017-11-21 DIAGNOSIS — E7800 Pure hypercholesterolemia, unspecified: Secondary | ICD-10-CM | POA: Insufficient documentation

## 2017-11-21 DIAGNOSIS — I898 Other specified noninfective disorders of lymphatic vessels and lymph nodes: Secondary | ICD-10-CM | POA: Diagnosis not present

## 2017-11-21 DIAGNOSIS — Z79899 Other long term (current) drug therapy: Secondary | ICD-10-CM | POA: Insufficient documentation

## 2017-11-21 DIAGNOSIS — Z7902 Long term (current) use of antithrombotics/antiplatelets: Secondary | ICD-10-CM | POA: Diagnosis not present

## 2017-11-21 DIAGNOSIS — I129 Hypertensive chronic kidney disease with stage 1 through stage 4 chronic kidney disease, or unspecified chronic kidney disease: Secondary | ICD-10-CM | POA: Diagnosis not present

## 2017-11-21 DIAGNOSIS — Z87891 Personal history of nicotine dependence: Secondary | ICD-10-CM | POA: Diagnosis not present

## 2017-11-21 DIAGNOSIS — Z794 Long term (current) use of insulin: Secondary | ICD-10-CM | POA: Diagnosis not present

## 2017-11-21 DIAGNOSIS — Z992 Dependence on renal dialysis: Secondary | ICD-10-CM | POA: Diagnosis not present

## 2017-11-21 DIAGNOSIS — E1122 Type 2 diabetes mellitus with diabetic chronic kidney disease: Secondary | ICD-10-CM | POA: Diagnosis not present

## 2017-11-21 DIAGNOSIS — T82898A Other specified complication of vascular prosthetic devices, implants and grafts, initial encounter: Secondary | ICD-10-CM | POA: Diagnosis not present

## 2017-11-21 DIAGNOSIS — N189 Chronic kidney disease, unspecified: Secondary | ICD-10-CM | POA: Insufficient documentation

## 2017-11-21 DIAGNOSIS — E1022 Type 1 diabetes mellitus with diabetic chronic kidney disease: Secondary | ICD-10-CM | POA: Diagnosis not present

## 2017-11-21 DIAGNOSIS — Z7982 Long term (current) use of aspirin: Secondary | ICD-10-CM | POA: Diagnosis not present

## 2017-11-21 DIAGNOSIS — I69359 Hemiplegia and hemiparesis following cerebral infarction affecting unspecified side: Secondary | ICD-10-CM | POA: Diagnosis not present

## 2017-11-21 DIAGNOSIS — N185 Chronic kidney disease, stage 5: Secondary | ICD-10-CM | POA: Diagnosis not present

## 2017-11-21 HISTORY — PX: DRAINAGE AND CLOSURE OF LYMPHOCELE: SHX5800

## 2017-11-21 LAB — POCT I-STAT 4, (NA,K, GLUC, HGB,HCT)
Glucose, Bld: 126 mg/dL — ABNORMAL HIGH (ref 65–99)
HEMATOCRIT: 34 % — AB (ref 39.0–52.0)
HEMOGLOBIN: 11.6 g/dL — AB (ref 13.0–17.0)
Potassium: 4 mmol/L (ref 3.5–5.1)
Sodium: 146 mmol/L — ABNORMAL HIGH (ref 135–145)

## 2017-11-21 LAB — GLUCOSE, CAPILLARY: GLUCOSE-CAPILLARY: 127 mg/dL — AB (ref 65–99)

## 2017-11-21 SURGERY — DRAINAGE AND CLOSURE OF LYMPHOCELE
Anesthesia: Monitor Anesthesia Care | Site: Arm Upper | Laterality: Left

## 2017-11-21 MED ORDER — CEFAZOLIN SODIUM-DEXTROSE 2-4 GM/100ML-% IV SOLN
2.0000 g | INTRAVENOUS | Status: AC
  Administered 2017-11-21: 2 g via INTRAVENOUS
  Filled 2017-11-21: qty 100

## 2017-11-21 MED ORDER — LIDOCAINE HCL (CARDIAC) 20 MG/ML IV SOLN
INTRAVENOUS | Status: DC | PRN
  Administered 2017-11-21: 30 mg via INTRAVENOUS

## 2017-11-21 MED ORDER — FENTANYL CITRATE (PF) 250 MCG/5ML IJ SOLN
INTRAMUSCULAR | Status: AC
  Filled 2017-11-21: qty 5

## 2017-11-21 MED ORDER — SODIUM CHLORIDE 0.9 % IV SOLN: INTRAVENOUS | Status: DC

## 2017-11-21 MED ORDER — SODIUM CHLORIDE 0.9 % IV SOLN
INTRAVENOUS | Status: DC | PRN
  Administered 2017-11-21 (×2): via INTRAVENOUS

## 2017-11-21 MED ORDER — CHLORHEXIDINE GLUCONATE 4 % EX LIQD: 60.0000 mL | Freq: Once | CUTANEOUS | Status: DC

## 2017-11-21 MED ORDER — THROMBIN 20000 UNITS EX SOLR
CUTANEOUS | Status: AC
  Filled 2017-11-21: qty 20000

## 2017-11-21 MED ORDER — FENTANYL CITRATE (PF) 100 MCG/2ML IJ SOLN
INTRAMUSCULAR | Status: DC | PRN
  Administered 2017-11-21 (×2): 50 ug via INTRAVENOUS

## 2017-11-21 MED ORDER — SODIUM CHLORIDE 0.9 % IV SOLN
INTRAVENOUS | Status: AC
  Filled 2017-11-21: qty 1.2

## 2017-11-21 MED ORDER — PHENYLEPHRINE 40 MCG/ML (10ML) SYRINGE FOR IV PUSH (FOR BLOOD PRESSURE SUPPORT)
PREFILLED_SYRINGE | INTRAVENOUS | Status: AC
  Filled 2017-11-21: qty 10

## 2017-11-21 MED ORDER — LIDOCAINE HCL (PF) 1 % IJ SOLN
INTRAMUSCULAR | Status: AC
  Filled 2017-11-21: qty 30

## 2017-11-21 MED ORDER — LIDOCAINE HCL 1 % IJ SOLN
INTRAMUSCULAR | Status: DC | PRN
  Administered 2017-11-21: 30 mL

## 2017-11-21 MED ORDER — TRAMADOL HCL 50 MG PO TABS: 50.0000 mg | ORAL_TABLET | Freq: Four times a day (QID) | ORAL | 0 refills | Status: DC | PRN

## 2017-11-21 MED ORDER — FENTANYL CITRATE (PF) 100 MCG/2ML IJ SOLN: 25.0000 ug | INTRAMUSCULAR | Status: DC | PRN

## 2017-11-21 MED ORDER — MEPERIDINE HCL 50 MG/ML IJ SOLN: 6.2500 mg | INTRAMUSCULAR | Status: DC | PRN

## 2017-11-21 MED ORDER — SODIUM CHLORIDE 0.9 % IV SOLN
INTRAVENOUS | Status: DC | PRN
  Administered 2017-11-21: 09:00:00

## 2017-11-21 MED ORDER — PROMETHAZINE HCL 25 MG/ML IJ SOLN: 6.2500 mg | INTRAMUSCULAR | Status: DC | PRN

## 2017-11-21 MED ORDER — PROPOFOL 500 MG/50ML IV EMUL
INTRAVENOUS | Status: DC | PRN
  Administered 2017-11-21: 100 ug/kg/min via INTRAVENOUS

## 2017-11-21 MED ORDER — LIDOCAINE 2% (20 MG/ML) 5 ML SYRINGE
INTRAMUSCULAR | Status: AC
  Filled 2017-11-21: qty 5

## 2017-11-21 MED ORDER — SODIUM CHLORIDE 0.9 % IR SOLN
Status: DC | PRN
  Administered 2017-11-21: 1000 mL

## 2017-11-21 SURGICAL SUPPLY — 51 items
ADH SKN CLS APL DERMABOND .7 (GAUZE/BANDAGES/DRESSINGS)
BANDAGE ACE 4X5 VEL STRL LF (GAUZE/BANDAGES/DRESSINGS) ×2 IMPLANT
BANDAGE ESMARK 6X9 LF (GAUZE/BANDAGES/DRESSINGS) IMPLANT
BNDG CMPR 9X6 STRL LF SNTH (GAUZE/BANDAGES/DRESSINGS)
BNDG ESMARK 6X9 LF (GAUZE/BANDAGES/DRESSINGS)
BNDG GAUZE ELAST 4 BULKY (GAUZE/BANDAGES/DRESSINGS) ×2 IMPLANT
CANISTER SUCT 3000ML PPV (MISCELLANEOUS) ×3 IMPLANT
CANNULA VESSEL 3MM 2 BLNT TIP (CANNULA) ×2 IMPLANT
CLIP TI WIDE RED SMALL 6 (CLIP) ×2 IMPLANT
CLIP VESOCCLUDE MED 6/CT (CLIP) ×2 IMPLANT
CUFF TOURNIQUET SINGLE 18IN (TOURNIQUET CUFF) IMPLANT
CUFF TOURNIQUET SINGLE 24IN (TOURNIQUET CUFF) IMPLANT
CUFF TOURNIQUET SINGLE 34IN LL (TOURNIQUET CUFF) IMPLANT
CUFF TOURNIQUET SINGLE 44IN (TOURNIQUET CUFF) IMPLANT
DERMABOND ADVANCED (GAUZE/BANDAGES/DRESSINGS)
DERMABOND ADVANCED .7 DNX12 (GAUZE/BANDAGES/DRESSINGS) IMPLANT
DRAIN SNY 10X20 3/4 PERF (WOUND CARE) IMPLANT
DRAPE ORTHO SPLIT 77X108 STRL (DRAPES)
DRAPE SURG ORHT 6 SPLT 77X108 (DRAPES) IMPLANT
ELECT REM PT RETURN 9FT ADLT (ELECTROSURGICAL) ×3
ELECTRODE REM PT RTRN 9FT ADLT (ELECTROSURGICAL) ×2 IMPLANT
EVACUATOR SILICONE 100CC (DRAIN) IMPLANT
GAUZE SPONGE 4X4 12PLY STRL LF (GAUZE/BANDAGES/DRESSINGS) ×2 IMPLANT
GLOVE BIO SURGEON STRL SZ7.5 (GLOVE) ×3 IMPLANT
GLOVE BIOGEL PI IND STRL 6.5 (GLOVE) ×1 IMPLANT
GLOVE BIOGEL PI INDICATOR 6.5 (GLOVE) ×1
GLOVE SURG SS PI 6.5 STRL IVOR (GLOVE) ×2 IMPLANT
GOWN STRL NON-REIN LRG LVL3 (GOWN DISPOSABLE) ×2 IMPLANT
GOWN STRL REUS W/ TWL LRG LVL3 (GOWN DISPOSABLE) ×6 IMPLANT
GOWN STRL REUS W/TWL LRG LVL3 (GOWN DISPOSABLE) ×9
KIT BASIN OR (CUSTOM PROCEDURE TRAY) ×3 IMPLANT
KIT TURNOVER KIT B (KITS) ×3 IMPLANT
LOOP VESSEL MAXI BLUE (MISCELLANEOUS) ×2 IMPLANT
NS IRRIG 1000ML POUR BTL (IV SOLUTION) ×6 IMPLANT
PACK CV ACCESS (CUSTOM PROCEDURE TRAY) ×2 IMPLANT
PACK GENERAL/GYN (CUSTOM PROCEDURE TRAY) IMPLANT
PACK PERIPHERAL VASCULAR (CUSTOM PROCEDURE TRAY) IMPLANT
PACK UNIVERSAL I (CUSTOM PROCEDURE TRAY) IMPLANT
PAD ARMBOARD 7.5X6 YLW CONV (MISCELLANEOUS) ×6 IMPLANT
SPONGE SURGIFOAM ABS GEL 100 (HEMOSTASIS) IMPLANT
STAPLER VISISTAT 35W (STAPLE) IMPLANT
SUT ETHILON 3 0 PS 1 (SUTURE) IMPLANT
SUT PROLENE 5 0 C 1 24 (SUTURE) IMPLANT
SUT PROLENE 6 0 CC (SUTURE) IMPLANT
SUT VIC AB 2-0 CTX 36 (SUTURE) IMPLANT
SUT VIC AB 3-0 SH 27 (SUTURE)
SUT VIC AB 3-0 SH 27X BRD (SUTURE) IMPLANT
SUT VIC AB 4-0 PS2 27 (SUTURE) ×2 IMPLANT
TOWEL GREEN STERILE (TOWEL DISPOSABLE) ×3 IMPLANT
UNDERPAD 30X30 (UNDERPADS AND DIAPERS) ×3 IMPLANT
WATER STERILE IRR 1000ML POUR (IV SOLUTION) ×3 IMPLANT

## 2017-11-21 NOTE — Transfer of Care (Signed)
Immediate Anesthesia Transfer of Care Note  Patient: Daniel Santos.  Procedure(s) Performed: DRAINAGE AND CLOSURE OF LYMPHOCELE OF LEFT UPPER ARM (Left Arm Upper)  Patient Location: PACU  Anesthesia Type:MAC  Level of Consciousness: awake, alert  and oriented  Airway & Oxygen Therapy: Patient Spontanous Breathing  Post-op Assessment: Report given to RN and Post -op Vital signs reviewed and stable  Post vital signs: Reviewed and stable  Last Vitals:  Vitals Value Taken Time  BP    Temp    Pulse    Resp    SpO2      Last Pain:  Vitals:   11/21/17 0952  TempSrc:   PainSc: 0-No pain         Complications: No apparent anesthesia complications

## 2017-11-21 NOTE — Interval H&P Note (Signed)
History and Physical Interval Note:  11/21/2017 8:19 AM  Daniel SavoyJohn A Mirabella Jr.  has presented today for surgery, with the diagnosis of HEMATOMA AT HEMODIALYSIS ACCESS SITE LEFT ANTECUBITAL  The various methods of treatment have been discussed with the patient and family. After consideration of risks, benefits and other options for treatment, the patient has consented to  Procedure(s): EVACUATION HEMATOMA LEFT ANTECUBITAL S/P ACCESS CREATION SITE (Left) as a surgical intervention .  The patient's history has been reviewed, patient examined, no change in status, stable for surgery.  I have reviewed the patient's chart and labs.  Questions were answered to the patient's satisfaction.     Fabienne Brunsharles Brandye Inthavong

## 2017-11-21 NOTE — Op Note (Signed)
Procedure: Evacuation of lymphocele left arm  Preoperative diagnosis: Lymphocele left arm  Postoperative diagnosis: Same  Anesthesia: Local with IV sedation  Operative findings: 3 cm lymphocele left antecubital region patent AV fistula  Operative details: After obtaining informed consent, the patient was taken the operating.  The patient was placed in supine position on the operating room table.  After adequate sedation the patient's entire left upper extremity was prepped and draped in usual sterile fashion.  Next local anesthesia was infiltrated over the antecubital crease in her pre-existing longitudinal incision.  The incision was reopened carried through the subtendinous tissues and the lymphocele cavity was entered.  Approximately 20 cc of clear yellow fluid was evacuated.  The fistula was examined and found to have good flow within it on palpation.  There was also good Doppler flow.  The lymphocele cavity was thoroughly irrigated with normal saline solution.  The subcutaneous tissues were then reapproximated using a running 3-0 Vicryl suture.  The skin was closed with a 4-0 Vicryl subcuticular stitch.  The patient tolerated procedure well and there were no complications.  The instrument sponge needle count was correct at the end of the case.  The patient was taken the recovery room in stable condition.  Daniel Brunsharles Lylia Karn, MD [facility] [place] Office: [phone] Pager: [phone]

## 2017-11-21 NOTE — Anesthesia Postprocedure Evaluation (Signed)
Anesthesia Post Note  Patient: Daniel Santos.  Procedure(s) Performed: DRAINAGE AND CLOSURE OF LYMPHOCELE OF LEFT UPPER ARM (Left Arm Upper)     Patient location during evaluation: PACU Anesthesia Type: MAC Level of consciousness: awake and alert Pain management: pain level controlled Vital Signs Assessment: post-procedure vital signs reviewed and stable Respiratory status: spontaneous breathing Cardiovascular status: stable Anesthetic complications: no    Last Vitals:  Vitals:   11/21/17 0952 11/21/17 1035  BP: (!) 142/86 139/77  Pulse: 72 75  Resp: 14 16  Temp:    SpO2: 97% 98%    Last Pain:  Vitals:   11/21/17 0952  TempSrc:   PainSc: 0-No pain                 Nolon Nations

## 2017-11-21 NOTE — Progress Notes (Signed)
Arrived to pacu with iv infusing from or 

## 2017-11-21 NOTE — Discharge Instructions (Signed)
Leave dressing in place for 48 hours.  Then remove dressing and wash once daily with soap and water   Office will call or send you a letter with appt with Dr Arbie CookeyEarly in 6 weeks

## 2017-11-21 NOTE — Telephone Encounter (Signed)
-----   Message from Sharee PimpleMarilyn K McChesney, RN sent at 11/21/2017  9:54 AM EDT ----- Regarding: 6 weeks with Dr. Arbie CookeyEarly to schedule surgery   ----- Message ----- From: Sherren KernsFields, Charles E, MD Sent: 11/21/2017   9:27 AM To: Vvs Charge Pool  Evacuation of lymphocele No asst  Pt needs follow up appt with Dr Arbie CookeyEarly in 6 weeks with duplex to decide on 2nd stage.  Please cancel any in between appts  Fabienne Brunsharles Fields

## 2017-11-21 NOTE — Anesthesia Procedure Notes (Signed)
Procedure Name: MAC Date/Time: 11/21/2017 8:37 AM Performed by: Eligha Bridegroom, CRNA Pre-anesthesia Checklist: Patient identified, Emergency Drugs available, Suction available, Patient being monitored and Timeout performed Patient Re-evaluated:Patient Re-evaluated prior to induction Oxygen Delivery Method: Nasal cannula Preoxygenation: Pre-oxygenation with 100% oxygen Induction Type: IV induction Grade View: Grade I

## 2017-11-21 NOTE — Telephone Encounter (Signed)
/  Sched lab 12/28/17 at 4:00 and MD 01/01/18 at 8:45. Cxl'd 12/11/17 appt. Lm on hm# to inform pt of cxl'd appt and new appts.

## 2017-11-22 ENCOUNTER — Encounter (HOSPITAL_COMMUNITY): Payer: Self-pay | Admitting: Vascular Surgery

## 2017-11-23 NOTE — Addendum Note (Signed)
Addendum  created 11/23/17 0233 by Lewie LoronGermeroth, Orval, MD   Intraprocedure Staff edited

## 2017-12-05 ENCOUNTER — Other Ambulatory Visit: Payer: Self-pay

## 2017-12-05 DIAGNOSIS — N184 Chronic kidney disease, stage 4 (severe): Secondary | ICD-10-CM

## 2017-12-11 ENCOUNTER — Encounter: Payer: BLUE CROSS/BLUE SHIELD | Admitting: Vascular Surgery

## 2017-12-21 DIAGNOSIS — Z794 Long term (current) use of insulin: Secondary | ICD-10-CM | POA: Diagnosis not present

## 2017-12-21 DIAGNOSIS — E109 Type 1 diabetes mellitus without complications: Secondary | ICD-10-CM | POA: Diagnosis not present

## 2017-12-21 DIAGNOSIS — E108 Type 1 diabetes mellitus with unspecified complications: Secondary | ICD-10-CM | POA: Diagnosis not present

## 2017-12-28 ENCOUNTER — Ambulatory Visit (HOSPITAL_COMMUNITY)
Admission: RE | Admit: 2017-12-28 | Discharge: 2017-12-28 | Disposition: A | Discharge status: Home / Self Care | Payer: BLUE CROSS/BLUE SHIELD | Source: Ambulatory Visit | Attending: Family | Admitting: Family

## 2017-12-28 DIAGNOSIS — N184 Chronic kidney disease, stage 4 (severe): Secondary | ICD-10-CM

## 2018-01-01 ENCOUNTER — Ambulatory Visit (INDEPENDENT_AMBULATORY_CARE_PROVIDER_SITE_OTHER): Payer: Self-pay | Admitting: Vascular Surgery

## 2018-01-01 ENCOUNTER — Other Ambulatory Visit: Payer: Self-pay | Admitting: *Deleted

## 2018-01-01 ENCOUNTER — Other Ambulatory Visit: Payer: Self-pay

## 2018-01-01 ENCOUNTER — Encounter: Payer: Self-pay | Admitting: Vascular Surgery

## 2018-01-01 ENCOUNTER — Encounter: Payer: Self-pay | Admitting: *Deleted

## 2018-01-01 VITALS — BP 146/82 | HR 71 | Temp 97.8°F | Resp 20 | Ht 69.0 in | Wt 134.5 lb

## 2018-01-01 DIAGNOSIS — N184 Chronic kidney disease, stage 4 (severe): Secondary | ICD-10-CM | POA: Diagnosis not present

## 2018-01-01 DIAGNOSIS — I129 Hypertensive chronic kidney disease with stage 1 through stage 4 chronic kidney disease, or unspecified chronic kidney disease: Secondary | ICD-10-CM | POA: Diagnosis not present

## 2018-01-01 DIAGNOSIS — D631 Anemia in chronic kidney disease: Secondary | ICD-10-CM | POA: Diagnosis not present

## 2018-01-01 DIAGNOSIS — N2581 Secondary hyperparathyroidism of renal origin: Secondary | ICD-10-CM | POA: Diagnosis not present

## 2018-01-01 NOTE — Progress Notes (Signed)
Patient name: Daniel Santos. MRN: [id number] DOB: [birth year] Sex: male  REASON FOR VISIT: Follow-up left first stage brachiobasilic fistula  HPI: [name] is a 57 y.o. male chronic renal insufficiency here for follow-up of his first stage brachiobasilic fistula.  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  He had evacuation of this by Dr. Darrick Penna on 11/21/2017.  He is here today for continued follow-up.  Current Outpatient Medications  Medication Sig Dispense Refill  . amLODipine (NORVASC) 10 MG tablet Take 10 mg by mouth at bedtime.     Marland Kitchen aspirin EC 325 MG EC tablet Take 1 tablet (325 mg total) by mouth daily. 30 tablet 4  . atorvastatin (LIPITOR) 40 MG tablet Take 1 tablet (40 mg total) every evening by mouth. 30 tablet 2  . BUTRANS 20 MCG/HR PTWK patch Place 20 mcg onto the skin once a week. On Sunday    . calcitRIOL (ROCALTROL) 0.25 MCG capsule Take 0.25 mcg by mouth every Monday, Wednesday, and Friday.     . carvedilol (COREG) 25 MG tablet Take 1 tablet (25 mg total) by mouth 2 (two) times daily with a meal. 60 tablet 11  . clopidogrel (PLAVIX) 75 MG tablet Take 1 tablet (75 mg total) by mouth daily. 30 tablet 0  . CONTOUR NEXT TEST test strip     . ferrous sulfate 325 (65 FE) MG tablet Take 325 mg by mouth at bedtime.     . furosemide (LASIX) 40 MG tablet Take 40 mg by mouth daily as needed for fluid or edema.     . insulin lispro (HUMALOG) 100 UNIT/ML injection Inject 2-10 Units into the skin See admin instructions. Uses via insulin pump. Basal 2 units per hour, bolus 10 units. Approximately 4 units per meal.    . LINZESS 145 MCG CAPS capsule Take 145 mcg by mouth daily as needed (for constipation).     . Miconazole Nitrate (LOTRIMIN AF) 2 % AERO Apply 1 spray topically daily.    Marland Kitchen neomycin-bacitracin-polymyxin (NEOSPORIN) ointment Apply 1 application topically as  needed for wound care.    . Undecylenic Ac-Zn Undecylenate (FUNGI-NAIL TOE & FOOT EX) Apply 1 application topically daily.    Marland Kitchen oxyCODONE-acetaminophen (PERCOCET) 5-325 MG tablet Take 1 tablet by mouth every 6 (six) hours as needed for severe pain. (Patient not taking: Reported on 11/19/2017) 8 tablet 0  . traMADol (ULTRAM) 50 MG tablet Take 1 tablet (50 mg total) by mouth every 6 (six) hours as needed. (Patient not taking: Reported on 01/01/2018) 6 tablet 0   No current facility-administered medications for this visit.      PHYSICAL EXAM: Vitals:   01/01/18 0848 01/01/18 0850  BP: (!) 144/80 (!) 146/82  Pulse: 71   Resp: 20   Temp: 97.8 F (36.6 C)   TempSrc: Oral   SpO2: 98%   Weight: 134 lb 8 oz (61 kg)   Height:  (1.753 m)     GENERAL: The patient is a well-nourished male, in no acute distress. The vital signs are documented above. Thrill in his left upper arm.  Has a healing with no evidence of recurrent lymphocele in his left antecubital space  MEDICAL ISSUES: I imaged his basilic vein with SonoSite ultrasound.  He has a very nice development throughout its course.  I have recommended a second stage basilic vein transposition and described the procedure as an  outpatient.  He wishes to proceed as soon as possible   Vandana Haman F. Lanessa Shill, MD FACS [facility] [place] Office Tel [phone] Pager [phone] 

## 2018-01-01 NOTE — H&P (View-Only) (Signed)
Patient name: Daniel Santos. MRN: [id number] DOB: [birth year] Sex: male  REASON FOR VISIT: Follow-up left first stage brachiobasilic fistula  HPI: Daniel Fauth. is a 57 y.o. male chronic renal insufficiency here for follow-up of his first stage brachiobasilic fistula.  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  He had evacuation of this by Dr. Darrick Penna on 11/21/2017.  He is here today for continued follow-up.  Current Outpatient Medications  Medication Sig Dispense Refill  . amLODipine (NORVASC) 10 MG tablet Take 10 mg by mouth at bedtime.     Marland Kitchen aspirin EC 325 MG EC tablet Take 1 tablet (325 mg total) by mouth daily. 30 tablet 4  . atorvastatin (LIPITOR) 40 MG tablet Take 1 tablet (40 mg total) every evening by mouth. 30 tablet 2  . BUTRANS 20 MCG/HR PTWK patch Place 20 mcg onto the skin once a week. On Sunday    . calcitRIOL (ROCALTROL) 0.25 MCG capsule Take 0.25 mcg by mouth every Monday, Wednesday, and Friday.     . carvedilol (COREG) 25 MG tablet Take 1 tablet (25 mg total) by mouth 2 (two) times daily with a meal. 60 tablet 11  . clopidogrel (PLAVIX) 75 MG tablet Take 1 tablet (75 mg total) by mouth daily. 30 tablet 0  . CONTOUR NEXT TEST test strip     . ferrous sulfate 325 (65 FE) MG tablet Take 325 mg by mouth at bedtime.     . furosemide (LASIX) 40 MG tablet Take 40 mg by mouth daily as needed for fluid or edema.     . insulin lispro (HUMALOG) 100 UNIT/ML injection Inject 2-10 Units into the skin See admin instructions. Uses via insulin pump. Basal 2 units per hour, bolus 10 units. Approximately 4 units per meal.    . LINZESS 145 MCG CAPS capsule Take 145 mcg by mouth daily as needed (for constipation).     . Miconazole Nitrate (LOTRIMIN AF) 2 % AERO Apply 1 spray topically daily.    Marland Kitchen neomycin-bacitracin-polymyxin (NEOSPORIN) ointment Apply 1 application topically as  needed for wound care.    . Undecylenic Ac-Zn Undecylenate (FUNGI-NAIL TOE & FOOT EX) Apply 1 application topically daily.    Marland Kitchen oxyCODONE-acetaminophen (PERCOCET) 5-325 MG tablet Take 1 tablet by mouth every 6 (six) hours as needed for severe pain. (Patient not taking: Reported on 11/19/2017) 8 tablet 0  . traMADol (ULTRAM) 50 MG tablet Take 1 tablet (50 mg total) by mouth every 6 (six) hours as needed. (Patient not taking: Reported on 01/01/2018) 6 tablet 0   No current facility-administered medications for this visit.      PHYSICAL EXAM: Vitals:   01/01/18 0848 01/01/18 0850  BP: (!) 144/80 (!) 146/82  Pulse: 71   Resp: 20   Temp: 97.8 F (36.6 C)   TempSrc: Oral   SpO2: 98%   Weight: 134 lb 8 oz (61 kg)   Height:  (1.753 m)     GENERAL: The patient is a well-nourished male, in no acute distress. The vital signs are documented above. Thrill in his left upper arm.  Has a healing with no evidence of recurrent lymphocele in his left antecubital space  MEDICAL ISSUES: I imaged his basilic vein with SonoSite ultrasound.  He has a very nice development throughout its course.  I have recommended a second stage basilic vein transposition and described the procedure as an  outpatient.  He wishes to proceed as soon as possible   Larina Earthly, MD [facility] Tel [phone] Pager [phone]

## 2018-01-02 DIAGNOSIS — N184 Chronic kidney disease, stage 4 (severe): Secondary | ICD-10-CM | POA: Diagnosis not present

## 2018-01-14 DIAGNOSIS — E1121 Type 2 diabetes mellitus with diabetic nephropathy: Secondary | ICD-10-CM | POA: Diagnosis not present

## 2018-01-14 DIAGNOSIS — E13628 Other specified diabetes mellitus with other skin complications: Secondary | ICD-10-CM | POA: Diagnosis not present

## 2018-01-14 DIAGNOSIS — N184 Chronic kidney disease, stage 4 (severe): Secondary | ICD-10-CM | POA: Diagnosis not present

## 2018-01-14 DIAGNOSIS — Z79899 Other long term (current) drug therapy: Secondary | ICD-10-CM | POA: Diagnosis not present

## 2018-01-14 DIAGNOSIS — M545 Low back pain: Secondary | ICD-10-CM | POA: Diagnosis not present

## 2018-01-17 ENCOUNTER — Encounter (HOSPITAL_COMMUNITY): Payer: Self-pay | Admitting: *Deleted

## 2018-01-17 ENCOUNTER — Other Ambulatory Visit: Payer: Self-pay

## 2018-01-17 NOTE — Progress Notes (Signed)
Pt denies any acute cardiopulmonary issues. Pt under the care of Dr. Lavell Islam. [place], Cardiology. Pt denies having a cardiac cath. Pt denies having a chest x ray within the last year. Lujean RaveLauren McDaniel, Diabetes Coordinator (DC) advised that pt decrease basal rate by 20% at midnight or consult with endocrinologist or PCP regarding insulin pump. Pt made aware of DC recommendation and stated that he works third shift and knows how to manage his pump. Dr. Maple HudsonMoser, Anesthesia, advised that pt can eat a light breakfast but must be finished eating by 3:30A.M. and can have clears until 7:00A.M. ( Diet Sprite, tea, water etc.).  Pt made aware to stop taking vitamins, fish oil and herbal medications. Do not take any NSAIDs ie: Ibuprofen, Advil, Naproxen (Aleve), Motrin, BC and Goody Powder. Pt stated that last dose of Plavix was taken on Saturday, 01/12/18 as instructed. Pt made aware to check BG every 2 hours prior to arrival to hospital on DOS. Pt made aware to treat a BG < 70 with 4 glucose tabs wait 15 minutes after intervention to recheck BG, if BG remains < 70, call Short Stay unit to speak with a nurse. Pt verbalized understanding of all pre-op instructions.

## 2018-01-18 ENCOUNTER — Ambulatory Visit (HOSPITAL_COMMUNITY): Payer: BLUE CROSS/BLUE SHIELD | Admitting: Certified Registered"

## 2018-01-18 ENCOUNTER — Encounter (HOSPITAL_COMMUNITY): Payer: Self-pay | Admitting: Certified Registered"

## 2018-01-18 ENCOUNTER — Ambulatory Visit (HOSPITAL_COMMUNITY)
Admission: RE | Admit: 2018-01-18 | Discharge: 2018-01-18 | Disposition: A | Discharge status: Home / Self Care | Payer: BLUE CROSS/BLUE SHIELD | Source: Ambulatory Visit | Attending: Vascular Surgery | Admitting: Vascular Surgery

## 2018-01-18 ENCOUNTER — Encounter (HOSPITAL_COMMUNITY)
Admission: RE | Disposition: A | Discharge status: Home / Self Care | Payer: Self-pay | Source: Ambulatory Visit | Attending: Vascular Surgery

## 2018-01-18 DIAGNOSIS — D631 Anemia in chronic kidney disease: Secondary | ICD-10-CM | POA: Diagnosis not present

## 2018-01-18 DIAGNOSIS — Z87891 Personal history of nicotine dependence: Secondary | ICD-10-CM | POA: Diagnosis not present

## 2018-01-18 DIAGNOSIS — Z79899 Other long term (current) drug therapy: Secondary | ICD-10-CM | POA: Insufficient documentation

## 2018-01-18 DIAGNOSIS — Z7982 Long term (current) use of aspirin: Secondary | ICD-10-CM | POA: Diagnosis not present

## 2018-01-18 DIAGNOSIS — N184 Chronic kidney disease, stage 4 (severe): Secondary | ICD-10-CM

## 2018-01-18 DIAGNOSIS — Z8673 Personal history of transient ischemic attack (TIA), and cerebral infarction without residual deficits: Secondary | ICD-10-CM | POA: Diagnosis not present

## 2018-01-18 DIAGNOSIS — I129 Hypertensive chronic kidney disease with stage 1 through stage 4 chronic kidney disease, or unspecified chronic kidney disease: Secondary | ICD-10-CM | POA: Diagnosis not present

## 2018-01-18 DIAGNOSIS — E1122 Type 2 diabetes mellitus with diabetic chronic kidney disease: Secondary | ICD-10-CM | POA: Diagnosis not present

## 2018-01-18 DIAGNOSIS — Z7902 Long term (current) use of antithrombotics/antiplatelets: Secondary | ICD-10-CM | POA: Diagnosis not present

## 2018-01-18 DIAGNOSIS — Z794 Long term (current) use of insulin: Secondary | ICD-10-CM | POA: Diagnosis not present

## 2018-01-18 DIAGNOSIS — N189 Chronic kidney disease, unspecified: Secondary | ICD-10-CM | POA: Diagnosis not present

## 2018-01-18 DIAGNOSIS — N185 Chronic kidney disease, stage 5: Secondary | ICD-10-CM | POA: Diagnosis not present

## 2018-01-18 HISTORY — PX: BASCILIC VEIN TRANSPOSITION: SHX5742

## 2018-01-18 LAB — POCT I-STAT 4, (NA,K, GLUC, HGB,HCT)
Glucose, Bld: 160 mg/dL — ABNORMAL HIGH (ref 65–99)
HEMATOCRIT: 36 % — AB (ref 39.0–52.0)
HEMOGLOBIN: 12.2 g/dL — AB (ref 13.0–17.0)
Potassium: 4.3 mmol/L (ref 3.5–5.1)
Sodium: 144 mmol/L (ref 135–145)

## 2018-01-18 LAB — GLUCOSE, CAPILLARY
GLUCOSE-CAPILLARY: 163 mg/dL — AB (ref 65–99)
GLUCOSE-CAPILLARY: 93 mg/dL (ref 65–99)

## 2018-01-18 SURGERY — TRANSPOSITION, VEIN, BASILIC
Anesthesia: Monitor Anesthesia Care | Site: Arm Lower | Laterality: Left

## 2018-01-18 MED ORDER — SODIUM CHLORIDE 0.9 % IV SOLN
INTRAVENOUS | Status: DC
  Administered 2018-01-18 (×2): via INTRAVENOUS

## 2018-01-18 MED ORDER — SODIUM CHLORIDE 0.9 % IJ SOLN
INTRAMUSCULAR | Status: AC
  Filled 2018-01-18: qty 10

## 2018-01-18 MED ORDER — SODIUM CHLORIDE 0.9 % IV SOLN
INTRAVENOUS | Status: DC | PRN
  Administered 2018-01-18: 500 mL

## 2018-01-18 MED ORDER — CEFAZOLIN SODIUM-DEXTROSE 2-4 GM/100ML-% IV SOLN
INTRAVENOUS | Status: AC
  Filled 2018-01-18: qty 100

## 2018-01-18 MED ORDER — PROPOFOL 500 MG/50ML IV EMUL
INTRAVENOUS | Status: DC | PRN
  Administered 2018-01-18: 200 ug/kg/min via INTRAVENOUS
  Administered 2018-01-18: 16:00:00 via INTRAVENOUS

## 2018-01-18 MED ORDER — OXYCODONE HCL 5 MG PO TABS: 5.0000 mg | ORAL_TABLET | Freq: Once | ORAL | Status: DC | PRN

## 2018-01-18 MED ORDER — OXYCODONE HCL 5 MG/5ML PO SOLN: 5.0000 mg | Freq: Once | ORAL | Status: DC | PRN

## 2018-01-18 MED ORDER — DEXAMETHASONE SODIUM PHOSPHATE 10 MG/ML IJ SOLN
INTRAMUSCULAR | Status: AC
  Filled 2018-01-18: qty 1

## 2018-01-18 MED ORDER — OXYCODONE-ACETAMINOPHEN 5-325 MG PO TABS: 1.0000 | ORAL_TABLET | Freq: Four times a day (QID) | ORAL | 0 refills | Status: DC | PRN

## 2018-01-18 MED ORDER — ONDANSETRON HCL 4 MG/2ML IJ SOLN
INTRAMUSCULAR | Status: AC
  Filled 2018-01-18: qty 2

## 2018-01-18 MED ORDER — 0.9 % SODIUM CHLORIDE (POUR BTL) OPTIME
TOPICAL | Status: DC | PRN
  Administered 2018-01-18: 1000 mL

## 2018-01-18 MED ORDER — ONDANSETRON HCL 4 MG/2ML IJ SOLN
INTRAMUSCULAR | Status: DC | PRN
  Administered 2018-01-18: 4 mg via INTRAVENOUS

## 2018-01-18 MED ORDER — FENTANYL CITRATE (PF) 250 MCG/5ML IJ SOLN
INTRAMUSCULAR | Status: AC
  Filled 2018-01-18: qty 5

## 2018-01-18 MED ORDER — PROPOFOL 1000 MG/100ML IV EMUL
INTRAVENOUS | Status: AC
  Filled 2018-01-18: qty 100

## 2018-01-18 MED ORDER — LIDOCAINE-EPINEPHRINE 0.5 %-1:200000 IJ SOLN
INTRAMUSCULAR | Status: DC | PRN
  Administered 2018-01-18: 19 mL

## 2018-01-18 MED ORDER — PROPOFOL 10 MG/ML IV BOLUS
INTRAVENOUS | Status: DC | PRN
  Administered 2018-01-18: 30 mg via INTRAVENOUS
  Administered 2018-01-18: 60 mg via INTRAVENOUS
  Administered 2018-01-18: 40 mg via INTRAVENOUS
  Administered 2018-01-18: 30 mg via INTRAVENOUS

## 2018-01-18 MED ORDER — ACETAMINOPHEN 325 MG PO TABS: 325.0000 mg | ORAL_TABLET | ORAL | Status: DC | PRN

## 2018-01-18 MED ORDER — SODIUM CHLORIDE 0.9 % IV SOLN: INTRAVENOUS | Status: DC

## 2018-01-18 MED ORDER — MIDAZOLAM HCL 2 MG/2ML IJ SOLN
INTRAMUSCULAR | Status: AC
  Filled 2018-01-18: qty 2

## 2018-01-18 MED ORDER — CEFAZOLIN SODIUM-DEXTROSE 2-4 GM/100ML-% IV SOLN
2.0000 g | INTRAVENOUS | Status: AC
  Administered 2018-01-18: 2 g via INTRAVENOUS

## 2018-01-18 MED ORDER — ACETAMINOPHEN 160 MG/5ML PO SOLN: 325.0000 mg | ORAL | Status: DC | PRN

## 2018-01-18 MED ORDER — FENTANYL CITRATE (PF) 100 MCG/2ML IJ SOLN: 25.0000 ug | INTRAMUSCULAR | Status: DC | PRN

## 2018-01-18 MED ORDER — FENTANYL CITRATE (PF) 250 MCG/5ML IJ SOLN
INTRAMUSCULAR | Status: DC | PRN
  Administered 2018-01-18 (×2): 25 ug via INTRAVENOUS

## 2018-01-18 MED ORDER — SODIUM CHLORIDE 0.9 % IV SOLN
INTRAVENOUS | Status: AC
  Filled 2018-01-18: qty 1.2

## 2018-01-18 MED ORDER — LIDOCAINE-EPINEPHRINE 0.5 %-1:200000 IJ SOLN
INTRAMUSCULAR | Status: AC
  Filled 2018-01-18: qty 1

## 2018-01-18 SURGICAL SUPPLY — 34 items
ADH SKN CLS APL DERMABOND .7 (GAUZE/BANDAGES/DRESSINGS) ×1
ARMBAND PINK RESTRICT EXTREMIT (MISCELLANEOUS) ×3 IMPLANT
BANDAGE ACE 4X5 VEL STRL LF (GAUZE/BANDAGES/DRESSINGS) ×2 IMPLANT
BNDG GAUZE ELAST 4 BULKY (GAUZE/BANDAGES/DRESSINGS) ×2 IMPLANT
CANISTER SUCT 3000ML PPV (MISCELLANEOUS) ×3 IMPLANT
CANNULA VESSEL 3MM 2 BLNT TIP (CANNULA) ×3 IMPLANT
CLIP LIGATING EXTRA MED SLVR (CLIP) ×3 IMPLANT
CLIP LIGATING EXTRA SM BLUE (MISCELLANEOUS) ×3 IMPLANT
COVER PROBE W GEL 5X96 (DRAPES) ×3 IMPLANT
DECANTER SPIKE VIAL GLASS SM (MISCELLANEOUS) ×3 IMPLANT
DERMABOND ADVANCED (GAUZE/BANDAGES/DRESSINGS) ×2
DERMABOND ADVANCED .7 DNX12 (GAUZE/BANDAGES/DRESSINGS) ×1 IMPLANT
ELECT REM PT RETURN 9FT ADLT (ELECTROSURGICAL) ×3
ELECTRODE REM PT RTRN 9FT ADLT (ELECTROSURGICAL) ×1 IMPLANT
GAUZE SPONGE 4X4 12PLY STRL LF (GAUZE/BANDAGES/DRESSINGS) ×2 IMPLANT
GLOVE SS BIOGEL STRL SZ 7.5 (GLOVE) ×1 IMPLANT
GLOVE SUPERSENSE BIOGEL SZ 7.5 (GLOVE) ×2
GOWN STRL REUS W/ TWL LRG LVL3 (GOWN DISPOSABLE) ×3 IMPLANT
GOWN STRL REUS W/TWL LRG LVL3 (GOWN DISPOSABLE) ×12
KIT BASIN OR (CUSTOM PROCEDURE TRAY) ×3 IMPLANT
KIT TURNOVER KIT B (KITS) ×3 IMPLANT
NS IRRIG 1000ML POUR BTL (IV SOLUTION) ×3 IMPLANT
PACK CV ACCESS (CUSTOM PROCEDURE TRAY) ×3 IMPLANT
PAD ARMBOARD 7.5X6 YLW CONV (MISCELLANEOUS) ×6 IMPLANT
SUT PROLENE 6 0 CC (SUTURE) ×3 IMPLANT
SUT SILK 2 0 SH (SUTURE) ×2 IMPLANT
SUT SILK 3 0 (SUTURE) ×6
SUT SILK 3-0 18XBRD TIE 12 (SUTURE) IMPLANT
SUT VIC AB 3-0 SH 27 (SUTURE) ×12
SUT VIC AB 3-0 SH 27X BRD (SUTURE) ×1 IMPLANT
TOWEL GREEN STERILE (TOWEL DISPOSABLE) ×3 IMPLANT
TOWEL GREEN STERILE FF (TOWEL DISPOSABLE) ×4 IMPLANT
UNDERPAD 30X30 (UNDERPADS AND DIAPERS) ×3 IMPLANT
WATER STERILE IRR 1000ML POUR (IV SOLUTION) ×3 IMPLANT

## 2018-01-18 NOTE — Transfer of Care (Signed)
Immediate Anesthesia Transfer of Care Note  Patient: Daniel Santos.  Procedure(s) Performed: SECOND STAGEBASILIC VEIN TRANSPOSITION LEFT ARM (Left Arm Lower)  Patient Location: PACU  Anesthesia Type:MAC  Level of Consciousness: awake  Airway & Oxygen Therapy: Patient Spontanous Breathing and Patient connected to face mask oxygen  Post-op Assessment: Report given to RN and Post -op Vital signs reviewed and stable  Post vital signs: Reviewed and stable  Last Vitals:  Vitals Value Taken Time  BP 129/78 01/18/2018  4:21 PM  Temp 36.5 C 01/18/2018  4:19 PM  Pulse 72 01/18/2018  4:25 PM  Resp 17 01/18/2018  4:25 PM  SpO2 100 % 01/18/2018  4:25 PM  Vitals shown include unvalidated device data.  Last Pain:  Vitals:   01/18/18 0959  TempSrc: Oral         Complications: No apparent anesthesia complications

## 2018-01-18 NOTE — Discharge Instructions (Signed)
° °  [facility]  Discharge Instructions  AV Fistula or Graft Surgery for Dialysis Access  Please refer to the following instructions for your post-procedure care. Your surgeon or physician assistant will discuss any changes with you.  Activity  You may drive the day following your surgery, if you are comfortable and no longer taking prescription pain medication. Resume full activity as the soreness in your incision resolves.  Bathing/Showering  You may shower after you go home. Keep your incision dry for 48 hours. Do not soak in a bathtub, hot tub, or swim until the incision heals completely. You may not shower if you have a hemodialysis catheter.  Incision Care  Clean your incision with mild soap and water after 48 hours. Pat the area dry with a clean towel. You do not need a bandage unless otherwise instructed. Do not apply any ointments or creams to your incision. You may have skin glue on your incision. Do not peel it off. It will come off on its own in about one week. Your arm may swell a bit after surgery. To reduce swelling use pillows to elevate your arm so it is above your heart. Your doctor will tell you if you need to lightly wrap your arm with an ACE bandage.  Diet  Resume your normal diet. There are not special food restrictions following this procedure. In order to heal from your surgery, it is CRITICAL to get adequate nutrition. Your body requires vitamins, minerals, and protein. Vegetables are the best source of vitamins and minerals. Vegetables also provide the perfect balance of protein. Processed food has little nutritional value, so try to avoid this.  Medications  Resume taking all of your medications. If your incision is causing pain, you may take over-the counter pain relievers such as acetaminophen (Tylenol). If you were prescribed a stronger pain medication, please be aware these medications can cause nausea and constipation. Prevent  nausea by taking the medication with a snack or meal. Avoid constipation by drinking plenty of fluids and eating foods with high amount of fiber, such as fruits, vegetables, and grains.  Do not take Tylenol if you are taking prescription pain medications.  Follow up Your surgeon may want to see you in the office following your access surgery. If so, this will be arranged at the time of your surgery.  Please call us immediately for any of the following conditions:  Increased pain, redness, drainage (pus) from your incision site Fever of 101 degrees or higher Severe or worsening pain at your incision site Hand pain or numbness.  Reduce your risk of vascular disease:  Stop smoking. If you would like help, call [facility] at 1-800-QUIT-NOW (256-398-46981-4151565466) or [place] at (724)841-3158681-154-0729  Manage your cholesterol Maintain a desired weight Control your diabetes Keep your blood pressure down  Dialysis  It will take several weeks to several months for your new dialysis access to be ready for use. Your surgeon will determine when it is okay to use it. Your nephrologist will continue to direct your dialysis. You can continue to use your Permcath until your new access is ready for use.   01/18/2018 Daniel SavoyJohn A Schopf Jr. [id number] 02/15/1961  Surgeon(s): Early, Kristen Loaderodd F, MD  Procedure(s): SECOND STAGE BASILIC VEIN TRANSPOSITION LEFT ARM  x Do not stick fistula for 6 weeks    If you have any questions, please call the office at [phone].

## 2018-01-18 NOTE — Anesthesia Preprocedure Evaluation (Signed)
Anesthesia Evaluation  Patient identified by MRN, date of birth, ID band Patient awake    Reviewed: Allergy & Precautions, Patient's Chart, lab work & pertinent test results, reviewed documented beta blocker date and time   Airway Mallampati: II  TM Distance: >3 FB Neck ROM: Full    Dental no notable dental hx. (+) Poor Dentition, Loose   Pulmonary former smoker,    breath sounds clear to auscultation       Cardiovascular hypertension, Pt. on medications and Pt. on home beta blockers Normal cardiovascular exam Rhythm:Regular Rate:Normal  ejection fraction was in the range of 60% to 65%. Wall   motion was normal; there were no regional wall motion   abnormalities.   Neuro/Psych CVA negative psych ROS   GI/Hepatic negative GI ROS, Neg liver ROS,   Endo/Other  diabetes, Insulin Dependent  Renal/GU CRFRenal disease     Musculoskeletal   Abdominal   Peds  Hematology  (+) Blood dyscrasia, anemia ,   Anesthesia Other Findings Dorena Cookey.  ECHO COMPLETE WO IMAGING ENHANCING AGENT  Order# [id number]  Reading physician: Skeet Latch, MD Ordering physician: Edwin Dada, MD Study date: 03/23/17 Study Result   Result status: Final result                             *[facility]*                         [address], [place] [address]                            [phone]  ------------------------------------------------------------------- Transthoracic Echocardiography  Patient:    Carolyn, Maniscalco MR #:       [id number] Study Date: 03/23/2017 Gender:     M Age:        57 Height:     175.3 cm Weight:     60 kg BSA:        1.7 m^2 Pt. Status: Room:       5M01C   ATTENDING    Rai, Ripudeep K  PERFORMING   [facility], Inpatient  ADMITTING    Danford, Timonthy Hovater P  ORDERING     Danford, Adonijah Baena P  REFERRING     Myrene Buddy P  SONOGRAPHER  Batzaya Batchuluun  cc:  ------------------------------------------------------------------- LV EF: 60% -   65%     Reproductive/Obstetrics                             Anesthesia Physical Anesthesia Plan  ASA: III  Anesthesia Plan: MAC   Post-op Pain Management:    Induction: Intravenous  PONV Risk Score and Plan: 1 and Ondansetron and Treatment may vary due to age or medical condition  Airway Management Planned: Nasal Cannula  Additional Equipment: None  Intra-op Plan:   Post-operative Plan:   Informed Consent: I have reviewed the patients History and Physical, chart, labs and discussed the procedure including the risks, benefits and alternatives for the proposed anesthesia with the patient or authorized representative who has  indicated his/her understanding and acceptance.   Dental advisory given  Plan Discussed with: CRNA and Surgeon  Anesthesia Plan Comments:         Anesthesia Quick Evaluation

## 2018-01-18 NOTE — Op Note (Signed)
    OPERATIVE REPORT  DATE OF SURGERY: 01/18/2018  PATIENT: Daniel SavoyJohn A Bless Jr., 57 y.o. male MRN: [id number]  DOB: 04/02/1961  PRE-OPERATIVE DIAGNOSIS: Chronic renal insufficiency  POST-OPERATIVE DIAGNOSIS:  Same  PROCEDURE: Second stage left basilic vein transposition fistula  SURGEON:  Gretta Beganodd Tijana Walder, M.D.  PHYSICIAN ASSISTANt Clinton GallantEmma Collins, PA-C and Darlin CocoSamantha Ryan, PA-C  ANESTHESIA: Local with sedation  EBL: Minimal ml  Total I/O In: 750 [I.V.:750] Out: 40 [Blood:40]  BLOOD ADMINISTERED: None  DRAINS: None  SPECIMEN: None  COUNTS CORRECT:  YES  PLAN OF CARE: PACU  PATIENT DISPOSITION:  PACU - hemodynamically stable  PROCEDURE DETAILS: Patient was taken to the operating placed supine position with the area of the left arm prepped and draped you sterile fashion.  SonoSite ultrasound was used to mark the basilic vein.  Incision was made over the prior antecubital space.  There was dense scarring in the antecubital space.  The basilic vein was isolated.  Separate incisions were made over the mid forearm up to the axilla to harvest the vein which is a very good caliber throughout.  Tributary branches were ligated with 3-0 and 4-0 silk ties and divided.  The brachial artery was isolated just above the old anastomosis since there was such dense scarring in the anastomosis itself.  The vein was ligated near the prior arterial anastomosis and was divided.  The vein was marked to reduce risk of twisting.  A new tunnel was created from the antecubital space to the axilla and the vein was brought through the tunnel.  The vein was dilated and was excellent caliber.  The brachial artery was open after occluding it proximally distally with an 11 blade and sent longitudinally with Potts scissors.  A small arteriotomy was created.  The vein was cut to the appropriate length and was sewn end-to-side to the artery with a running 6-0 Prolene suture.  Clamps were removed and excellent thrill was noted.   The patient maintained a 2-3+ radial pulse.  Wounds irrigated with saline.  Hemostasis talus cautery.  Wounds were closed with 3-0 Vicryl in the subcutaneous and subcuticular tissue.  Sterile dressing was applied and the patient was transferred to the recovery room in stable condition   Larina Earthlyodd F. Kamorah Nevils, M.D., [facility] 01/18/2018 4:39 PM

## 2018-01-18 NOTE — Anesthesia Procedure Notes (Signed)
Procedure Name: MAC Date/Time: 01/18/2018 1:33 PM Performed by: Imagene Riches, CRNA Pre-anesthesia Checklist: Patient identified, Emergency Drugs available, Suction available and Patient being monitored Patient Re-evaluated:Patient Re-evaluated prior to induction Oxygen Delivery Method: Simple face mask Preoxygenation: Pre-oxygenation with 100% oxygen

## 2018-01-18 NOTE — Interval H&P Note (Signed)
History and Physical Interval Note:  01/18/2018 12:49 PM  Daniel SavoyJohn A Hassebrock Jr.  has presented today for surgery, with the diagnosis of CHRONIC KIDNEY DISEASE FOR HEMODIALYSIS ACCESS  The various methods of treatment have been discussed with the patient and family. After consideration of risks, benefits and other options for treatment, the patient has consented to  Procedure(s): SECOND STAGEBASILIC VEIN TRANSPOSITION LEFT ARM (Left) as a surgical intervention .  The patient's history has been reviewed, patient examined, no change in status, stable for surgery.  I have reviewed the patient's chart and labs.  Questions were answered to the patient's satisfaction.     Gretta Beganodd Shelba Susi

## 2018-01-21 ENCOUNTER — Telehealth: Payer: Self-pay | Admitting: Vascular Surgery

## 2018-01-21 ENCOUNTER — Encounter (HOSPITAL_COMMUNITY): Payer: Self-pay | Admitting: Vascular Surgery

## 2018-01-21 NOTE — Anesthesia Postprocedure Evaluation (Signed)
Anesthesia Post Note  Patient: Daniel Santos.  Procedure(s) Performed: SECOND STAGEBASILIC VEIN TRANSPOSITION LEFT ARM (Left Arm Lower)     Patient location during evaluation: PACU Anesthesia Type: MAC Level of consciousness: awake and alert Pain management: pain level controlled Vital Signs Assessment: post-procedure vital signs reviewed and stable Respiratory status: spontaneous breathing, nonlabored ventilation, respiratory function stable and patient connected to nasal cannula oxygen Cardiovascular status: stable and blood pressure returned to baseline Postop Assessment: no apparent nausea or vomiting Anesthetic complications: no    Last Vitals:  Vitals:   01/18/18 1621 01/18/18 1635  BP: 129/78 136/90  Pulse: 63 70  Resp: 11 (!) 21  Temp:  36.5 C  SpO2: 100% 100%    Last Pain:  Vitals:   01/18/18 1635  TempSrc:   PainSc: 0-No pain                 Cabela Pacifico,JAMES TERRILL

## 2018-01-21 NOTE — Telephone Encounter (Signed)
sch appt spk to pt 02/12/18 2pm p/o PA

## 2018-01-28 ENCOUNTER — Encounter: Payer: Self-pay | Admitting: Cardiovascular Disease

## 2018-01-28 DIAGNOSIS — I129 Hypertensive chronic kidney disease with stage 1 through stage 4 chronic kidney disease, or unspecified chronic kidney disease: Secondary | ICD-10-CM | POA: Diagnosis not present

## 2018-01-28 DIAGNOSIS — N2581 Secondary hyperparathyroidism of renal origin: Secondary | ICD-10-CM | POA: Diagnosis not present

## 2018-01-28 DIAGNOSIS — N184 Chronic kidney disease, stage 4 (severe): Secondary | ICD-10-CM | POA: Diagnosis not present

## 2018-01-28 DIAGNOSIS — D631 Anemia in chronic kidney disease: Secondary | ICD-10-CM | POA: Diagnosis not present

## 2018-02-15 ENCOUNTER — Encounter: Payer: Self-pay | Admitting: Physician Assistant

## 2018-02-15 ENCOUNTER — Other Ambulatory Visit: Payer: Self-pay

## 2018-02-15 ENCOUNTER — Ambulatory Visit (INDEPENDENT_AMBULATORY_CARE_PROVIDER_SITE_OTHER): Payer: BLUE CROSS/BLUE SHIELD | Admitting: Physician Assistant

## 2018-02-15 VITALS — BP 139/73 | HR 72 | Temp 97.6°F | Resp 16 | Ht 69.0 in | Wt 138.0 lb

## 2018-02-15 DIAGNOSIS — N184 Chronic kidney disease, stage 4 (severe): Secondary | ICD-10-CM

## 2018-02-15 NOTE — Progress Notes (Signed)
    Postoperative Access Visit   History of Present Illness   Daniel SavoyJohn A Soria Jr. is a 57 y.o. year old male who presents for postoperative follow-up for: left second stage basilic vein transposition by Dr. Arbie CookeyEarly (Date: 01/18/18).  The patient's wounds are healed.  The patient denies steal symptoms.  The patient is able to complete their activities of daily living.  The patient is not yet on dialysis.   Physical Examination   Vitals:   02/15/18 1411  BP: 139/73  Pulse: 72  Resp: 16  Temp: 97.6 F (36.4 C)  TempSrc: Oral  SpO2: 97%  Weight: 138 lb (62.6 kg)  Height: 5\' 9"  (1.753 m)   Body mass index is 20.38 kg/m.  left arm Incision is healed with some keloid scar tissue, hand grip is 5/5, sensation in digits is intact, palpable thrill, bruit can be auscultated, palpable L radial pulse     Medical Decision Making   Daniel SavoyJohn A Melrose Jr. is a 57 y.o. year old male who presents s/p left second stage basilic vein transposition   The patient's access is ready for use in the event hemodialysis is initiated.  He may follow up with our office on an as needed basis   Emilie RutterMatthew Gurinder Toral PA-C [facility] [place] Office: [phone]

## 2018-02-21 DIAGNOSIS — N184 Chronic kidney disease, stage 4 (severe): Secondary | ICD-10-CM | POA: Diagnosis not present

## 2018-02-21 DIAGNOSIS — N2581 Secondary hyperparathyroidism of renal origin: Secondary | ICD-10-CM | POA: Diagnosis not present

## 2018-02-21 DIAGNOSIS — I129 Hypertensive chronic kidney disease with stage 1 through stage 4 chronic kidney disease, or unspecified chronic kidney disease: Secondary | ICD-10-CM | POA: Diagnosis not present

## 2018-02-21 DIAGNOSIS — D631 Anemia in chronic kidney disease: Secondary | ICD-10-CM | POA: Diagnosis not present

## 2018-03-14 DIAGNOSIS — N184 Chronic kidney disease, stage 4 (severe): Secondary | ICD-10-CM | POA: Diagnosis not present

## 2018-03-14 DIAGNOSIS — D631 Anemia in chronic kidney disease: Secondary | ICD-10-CM | POA: Diagnosis not present

## 2018-03-19 DIAGNOSIS — N184 Chronic kidney disease, stage 4 (severe): Secondary | ICD-10-CM | POA: Diagnosis not present

## 2018-03-19 DIAGNOSIS — Z01818 Encounter for other preprocedural examination: Secondary | ICD-10-CM | POA: Diagnosis not present

## 2018-03-20 DIAGNOSIS — N2581 Secondary hyperparathyroidism of renal origin: Secondary | ICD-10-CM | POA: Diagnosis not present

## 2018-03-20 DIAGNOSIS — D631 Anemia in chronic kidney disease: Secondary | ICD-10-CM | POA: Diagnosis not present

## 2018-03-20 DIAGNOSIS — N184 Chronic kidney disease, stage 4 (severe): Secondary | ICD-10-CM | POA: Diagnosis not present

## 2018-03-20 DIAGNOSIS — I129 Hypertensive chronic kidney disease with stage 1 through stage 4 chronic kidney disease, or unspecified chronic kidney disease: Secondary | ICD-10-CM | POA: Diagnosis not present

## 2018-03-25 DIAGNOSIS — E109 Type 1 diabetes mellitus without complications: Secondary | ICD-10-CM | POA: Diagnosis not present

## 2018-03-25 DIAGNOSIS — Z794 Long term (current) use of insulin: Secondary | ICD-10-CM | POA: Diagnosis not present

## 2018-03-25 DIAGNOSIS — E108 Type 1 diabetes mellitus with unspecified complications: Secondary | ICD-10-CM | POA: Diagnosis not present

## 2018-03-26 DIAGNOSIS — E109 Type 1 diabetes mellitus without complications: Secondary | ICD-10-CM | POA: Diagnosis not present

## 2018-04-10 DIAGNOSIS — N184 Chronic kidney disease, stage 4 (severe): Secondary | ICD-10-CM | POA: Diagnosis not present

## 2018-04-10 DIAGNOSIS — I517 Cardiomegaly: Secondary | ICD-10-CM | POA: Diagnosis not present

## 2018-04-10 DIAGNOSIS — N186 End stage renal disease: Secondary | ICD-10-CM | POA: Diagnosis not present

## 2018-04-10 DIAGNOSIS — E1022 Type 1 diabetes mellitus with diabetic chronic kidney disease: Secondary | ICD-10-CM | POA: Diagnosis not present

## 2018-04-10 DIAGNOSIS — Z7682 Awaiting organ transplant status: Secondary | ICD-10-CM | POA: Diagnosis not present

## 2018-04-10 DIAGNOSIS — Z01818 Encounter for other preprocedural examination: Secondary | ICD-10-CM | POA: Diagnosis not present

## 2018-04-10 DIAGNOSIS — I770 Arteriovenous fistula, acquired: Secondary | ICD-10-CM | POA: Diagnosis not present

## 2018-04-16 DIAGNOSIS — E109 Type 1 diabetes mellitus without complications: Secondary | ICD-10-CM | POA: Diagnosis not present

## 2018-04-16 DIAGNOSIS — N189 Chronic kidney disease, unspecified: Secondary | ICD-10-CM | POA: Diagnosis not present

## 2018-04-16 DIAGNOSIS — E7800 Pure hypercholesterolemia, unspecified: Secondary | ICD-10-CM | POA: Diagnosis not present

## 2018-04-16 DIAGNOSIS — Z23 Encounter for immunization: Secondary | ICD-10-CM | POA: Diagnosis not present

## 2018-04-16 DIAGNOSIS — I10 Essential (primary) hypertension: Secondary | ICD-10-CM | POA: Diagnosis not present

## 2018-04-18 DIAGNOSIS — E1121 Type 2 diabetes mellitus with diabetic nephropathy: Secondary | ICD-10-CM | POA: Diagnosis not present

## 2018-04-18 DIAGNOSIS — Z1211 Encounter for screening for malignant neoplasm of colon: Secondary | ICD-10-CM | POA: Diagnosis not present

## 2018-04-18 DIAGNOSIS — I129 Hypertensive chronic kidney disease with stage 1 through stage 4 chronic kidney disease, or unspecified chronic kidney disease: Secondary | ICD-10-CM | POA: Diagnosis not present

## 2018-04-18 DIAGNOSIS — G459 Transient cerebral ischemic attack, unspecified: Secondary | ICD-10-CM | POA: Diagnosis not present

## 2018-05-08 DIAGNOSIS — Z23 Encounter for immunization: Secondary | ICD-10-CM | POA: Diagnosis not present

## 2018-05-10 DIAGNOSIS — Z01818 Encounter for other preprocedural examination: Secondary | ICD-10-CM | POA: Diagnosis not present

## 2018-05-10 DIAGNOSIS — E1022 Type 1 diabetes mellitus with diabetic chronic kidney disease: Secondary | ICD-10-CM | POA: Diagnosis not present

## 2018-05-10 DIAGNOSIS — I6523 Occlusion and stenosis of bilateral carotid arteries: Secondary | ICD-10-CM | POA: Diagnosis not present

## 2018-05-10 DIAGNOSIS — I129 Hypertensive chronic kidney disease with stage 1 through stage 4 chronic kidney disease, or unspecified chronic kidney disease: Secondary | ICD-10-CM | POA: Diagnosis not present

## 2018-05-10 DIAGNOSIS — I120 Hypertensive chronic kidney disease with stage 5 chronic kidney disease or end stage renal disease: Secondary | ICD-10-CM | POA: Diagnosis not present

## 2018-05-10 DIAGNOSIS — Z794 Long term (current) use of insulin: Secondary | ICD-10-CM | POA: Diagnosis not present

## 2018-05-10 DIAGNOSIS — N184 Chronic kidney disease, stage 4 (severe): Secondary | ICD-10-CM | POA: Diagnosis not present

## 2018-06-06 DIAGNOSIS — D631 Anemia in chronic kidney disease: Secondary | ICD-10-CM | POA: Diagnosis not present

## 2018-06-06 DIAGNOSIS — N184 Chronic kidney disease, stage 4 (severe): Secondary | ICD-10-CM | POA: Diagnosis not present

## 2018-06-06 DIAGNOSIS — N2581 Secondary hyperparathyroidism of renal origin: Secondary | ICD-10-CM | POA: Diagnosis not present

## 2018-06-06 DIAGNOSIS — Z01818 Encounter for other preprocedural examination: Secondary | ICD-10-CM | POA: Diagnosis not present

## 2018-06-06 DIAGNOSIS — N189 Chronic kidney disease, unspecified: Secondary | ICD-10-CM | POA: Diagnosis not present

## 2018-06-06 DIAGNOSIS — I129 Hypertensive chronic kidney disease with stage 1 through stage 4 chronic kidney disease, or unspecified chronic kidney disease: Secondary | ICD-10-CM | POA: Diagnosis not present

## 2018-06-17 ENCOUNTER — Other Ambulatory Visit (HOSPITAL_COMMUNITY): Payer: Self-pay | Admitting: *Deleted

## 2018-06-18 ENCOUNTER — Ambulatory Visit (HOSPITAL_COMMUNITY)
Admission: RE | Admit: 2018-06-18 | Discharge: 2018-06-18 | Disposition: A | Discharge status: Home / Self Care | Payer: BLUE CROSS/BLUE SHIELD | Source: Ambulatory Visit | Attending: Nephrology | Admitting: Nephrology

## 2018-06-18 DIAGNOSIS — N189 Chronic kidney disease, unspecified: Secondary | ICD-10-CM | POA: Diagnosis not present

## 2018-06-18 DIAGNOSIS — D631 Anemia in chronic kidney disease: Secondary | ICD-10-CM | POA: Insufficient documentation

## 2018-06-18 MED ORDER — SODIUM CHLORIDE 0.9 % IV SOLN
510.0000 mg | Freq: Once | INTRAVENOUS | Status: AC
  Administered 2018-06-18: 510 mg via INTRAVENOUS
  Filled 2018-06-18: qty 17

## 2018-06-18 NOTE — Discharge Instructions (Signed)

## 2018-06-19 DIAGNOSIS — N184 Chronic kidney disease, stage 4 (severe): Secondary | ICD-10-CM | POA: Diagnosis not present

## 2018-06-19 DIAGNOSIS — N189 Chronic kidney disease, unspecified: Secondary | ICD-10-CM | POA: Diagnosis not present

## 2018-06-25 DIAGNOSIS — E108 Type 1 diabetes mellitus with unspecified complications: Secondary | ICD-10-CM | POA: Diagnosis not present

## 2018-06-25 DIAGNOSIS — Z794 Long term (current) use of insulin: Secondary | ICD-10-CM | POA: Diagnosis not present

## 2018-06-25 DIAGNOSIS — E109 Type 1 diabetes mellitus without complications: Secondary | ICD-10-CM | POA: Diagnosis not present

## 2018-07-02 ENCOUNTER — Encounter: Payer: Self-pay | Admitting: Internal Medicine

## 2018-07-02 DIAGNOSIS — R042 Hemoptysis: Secondary | ICD-10-CM | POA: Diagnosis not present

## 2018-07-02 DIAGNOSIS — G459 Transient cerebral ischemic attack, unspecified: Secondary | ICD-10-CM | POA: Diagnosis not present

## 2018-07-02 DIAGNOSIS — J029 Acute pharyngitis, unspecified: Secondary | ICD-10-CM | POA: Diagnosis not present

## 2018-07-02 DIAGNOSIS — N184 Chronic kidney disease, stage 4 (severe): Secondary | ICD-10-CM | POA: Diagnosis not present

## 2018-07-15 DIAGNOSIS — N2581 Secondary hyperparathyroidism of renal origin: Secondary | ICD-10-CM | POA: Diagnosis not present

## 2018-07-15 DIAGNOSIS — N184 Chronic kidney disease, stage 4 (severe): Secondary | ICD-10-CM | POA: Diagnosis not present

## 2018-07-15 DIAGNOSIS — D631 Anemia in chronic kidney disease: Secondary | ICD-10-CM | POA: Diagnosis not present

## 2018-07-15 DIAGNOSIS — I129 Hypertensive chronic kidney disease with stage 1 through stage 4 chronic kidney disease, or unspecified chronic kidney disease: Secondary | ICD-10-CM | POA: Diagnosis not present

## 2018-07-15 DIAGNOSIS — N189 Chronic kidney disease, unspecified: Secondary | ICD-10-CM | POA: Diagnosis not present

## 2018-07-25 ENCOUNTER — Telehealth: Payer: Self-pay

## 2018-07-25 ENCOUNTER — Encounter: Payer: Self-pay | Admitting: Internal Medicine

## 2018-07-25 ENCOUNTER — Ambulatory Visit (INDEPENDENT_AMBULATORY_CARE_PROVIDER_SITE_OTHER): Payer: BLUE CROSS/BLUE SHIELD | Admitting: Internal Medicine

## 2018-07-25 VITALS — BP 140/60 | HR 75 | Ht 69.0 in | Wt 134.0 lb

## 2018-07-25 DIAGNOSIS — K5903 Drug induced constipation: Secondary | ICD-10-CM | POA: Diagnosis not present

## 2018-07-25 DIAGNOSIS — Z9641 Presence of insulin pump (external) (internal): Secondary | ICD-10-CM

## 2018-07-25 DIAGNOSIS — Z1211 Encounter for screening for malignant neoplasm of colon: Secondary | ICD-10-CM | POA: Diagnosis not present

## 2018-07-25 DIAGNOSIS — T402X5A Adverse effect of other opioids, initial encounter: Secondary | ICD-10-CM

## 2018-07-25 DIAGNOSIS — R222 Localized swelling, mass and lump, trunk: Secondary | ICD-10-CM

## 2018-07-25 DIAGNOSIS — Z7902 Long term (current) use of antithrombotics/antiplatelets: Secondary | ICD-10-CM

## 2018-07-25 NOTE — Patient Instructions (Signed)
We are going to work on getting clearance for you to hold your Plavix and also instructions in regards to your insulin pump.   We will contact you when our February schedule comes out about setting up a pre-visit and colonoscopy appointment.   I appreciate the opportunity to care for you. Stan Headarl Gessner, MD, [facility]

## 2018-07-25 NOTE — Progress Notes (Signed)
Daniel SavoyJohn A Viereck Santos. 57 y.o. 04/14/1961 [id number] Referred by: Daniel Santos, Daniel Santos,*  Assessment & Plan:   Encounter Diagnoses  Name Primary?  . Colon cancer screening Yes  . Long term current use of antithrombotics/antiplatelets - clopidogrel after CVA   . Constipation due to opioid therapy   . Insulin pump in place   . Subcutaneous nodule of abdominal wall - vs hernia    We cannot schedule his appointment today so he will need to return after Feb schedule opens.  Will have RN previsit  My plans are as follows  1) Take Linzess daily starting 4 d before colonoscopy including AM of prep day 2) MiraLax prep to be used 3) Follow Insulin pump protocol 4) Hold clopidogrel x 5 d before colonoscopy - we will clarify with hisPCP 5) Hold ferrous sulfate x 3 days before colonoscopy   The risks and benefits as well as alternatives of endoscopic procedure(Santos) have been discussed and reviewed. All questions answered. The patient agrees to proceed. Extra real but rare risk of stroke or other vascular complication of clopidogrel reviewed also - he accepts this risk and understands we do it to reduce bleeding risk   I think he has a SQ nodule in right abd wall though he says he used to reduce it so maybe it is a tiny hernia?  WG:[id number]:Daniel Santos, Daniel Burrowakia S, FNP  Subjective:   Chief Complaint: needs screening colonoscopy  HPI 57 yo AA man awaiting listing for kidney transplant and he has been asked to have a screening colonoscopy. Moves bowels 2x a week w/o distress. Uses Linzess prn - it is effective if he uses it but rarely does.  Had a colonoscopy by me 2010 - internal hemorrhoids.  He has an insulin pump Says GFR 16% - will start peritoneal dialysis when time - also had an AV fistula placed in left UE in case of need for hemodialysis  Says he has to have an abdominal hernia fixed also Allergies  Allergen Reactions  . Doxycycline Nausea Only and Other (See Comments)    GI upset,  kidney function   Current Meds  Medication Sig  . amLODipine (NORVASC) 10 MG tablet Take 10 mg by mouth at bedtime.   Marland Kitchen. aspirin EC 325 MG EC tablet Take 1 tablet (325 mg total) by mouth daily.  Marland Kitchen. atorvastatin (LIPITOR) 40 MG tablet Take 1 tablet (40 mg total) every evening by mouth.  Lavera Guise. BUTRANS 20 MCG/HR PTWK patch Place 20 mcg onto the skin every Sunday.   . calcitRIOL (ROCALTROL) 0.25 MCG capsule Take 0.25 mcg by mouth every Monday, Wednesday, and Friday.   . clopidogrel (PLAVIX) 75 MG tablet Take 1 tablet (75 mg total) by mouth daily.  . ferrous sulfate 325 (65 FE) MG tablet Take 325 mg by mouth at bedtime.   . furosemide (LASIX) 40 MG tablet Take 40 mg by mouth daily as needed for fluid or edema.   . insulin lispro (HUMALOG) 100 UNIT/ML injection Inject 2-10 Units into the skin See admin instructions. Uses via insulin pump. Basal 2 units per hour, bolus 10 units. Approximately 4 units per meal.  . neomycin-bacitracin-polymyxin (NEOSPORIN) ointment Apply 1 application topically as needed for wound care.   Past Medical History:  Diagnosis Date  . Atypical chest pain 12/14/2015  . Diabetes mellitus without complication (HCC)    Type I  . 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 History of non-insulin dependent diabetes mellitus   .  Hypercholesteremia   . Hypertension   . Palpitations 12/14/2015  . Renal disorder   . Stroke [facility](HCC)    Past Surgical History:  Procedure Laterality Date  . BASCILIC VEIN TRANSPOSITION Left 10/26/2017   Procedure: BASILIC VEIN TRANSPOSITION FIRST STAGE LEFT;  Surgeon: Larina EarthlyEarly, Todd F, MD;  [facility];  Service: Vascular;  Laterality: Left;  . BASCILIC VEIN TRANSPOSITION Left 01/18/2018   Procedure: SECOND STAGEBASILIC VEIN TRANSPOSITION LEFT ARM;  Surgeon: Larina EarthlyEarly, Todd F, MD;  Location: [facility] OR;  Service: Vascular;  Laterality: Left;  . COLONOSCOPY    . DRAINAGE AND CLOSURE OF LYMPHOCELE Left 11/21/2017   Procedure:  DRAINAGE AND CLOSURE OF LYMPHOCELE OF LEFT UPPER ARM;  Surgeon: Sherren KernsFields, Charles E, MD;  [facility];  Service: Vascular;  Laterality: Left;  . LUMBAR SPINE SURGERY  2005, 2008, 2010  . REPAIR OF FRACTURED PENIS  05/2000   with cystoscopy   Social History   Social History Narrative   Patient drinks about 2 cups of caffeine daily.   Patient is right handed.   family history includes Breast cancer in his maternal grandmother and paternal grandmother; Cancer in his mother; Diabetes in his paternal aunt and paternal grandmother; Kidney disease in his father; Leukemia in his paternal uncle; Stroke in his paternal grandmother.   Review of Systems No chest pain No dyspnea Having sore throat and some hx hemoptysis  Objective:   Physical Exam BP 140/60   Pulse 75   Ht 5\' 9"  (1.753 m)   Wt 134 lb (60.8 kg)   SpO2 99%   BMI 19.79 kg/m  Thin bm NAD  Eyes anicteric  Lungs cta  Cor NL  abd small SQ nodule vs hernia to right of midline in upper abdomen Insulin pump needle LLQ Blood sugar sensor RLQ Soft and Nontender  Appropriate mood/affect  Alert and oriented x 3

## 2018-07-25 NOTE — Telephone Encounter (Signed)
  07/25/2018   RE: Daniel SavoyJohn A Palleschi Jr. DOB: 11/30/1960 MRN: [id number]   Dear Caryn Beeakia Starkes Perry, FNP,    We have scheduled the above patient for an endoscopic procedure. Our records show that he is on anticoagulation therapy.   Please advise as to how long the patient may come off his therapy of Plavix prior to the colonoscopy procedure, which is to be scheduled hopefully in February when our schedule comes out.   Please fax back/ or route the completed form to Kyen Taite Swazil[place]Jordan, CMA at [phone].   Sincerely,    Dr [name], CMA

## 2018-07-25 NOTE — Telephone Encounter (Signed)
We have faxed the insulin pump letter to Dr Talmage NapBalan and will await a response.

## 2018-07-26 ENCOUNTER — Other Ambulatory Visit (HOSPITAL_COMMUNITY): Payer: Self-pay | Admitting: *Deleted

## 2018-07-29 ENCOUNTER — Ambulatory Visit (HOSPITAL_COMMUNITY)
Admission: RE | Admit: 2018-07-29 | Discharge: 2018-07-29 | Disposition: A | Discharge status: Home / Self Care | Payer: BLUE CROSS/BLUE SHIELD | Source: Ambulatory Visit | Attending: Nephrology | Admitting: Nephrology

## 2018-07-29 DIAGNOSIS — D631 Anemia in chronic kidney disease: Secondary | ICD-10-CM | POA: Insufficient documentation

## 2018-07-29 DIAGNOSIS — N189 Chronic kidney disease, unspecified: Secondary | ICD-10-CM | POA: Diagnosis not present

## 2018-07-29 MED ORDER — FERUMOXYTOL INJECTION 510 MG/17 ML
510.0000 mg | Freq: Once | INTRAVENOUS | Status: AC
  Administered 2018-07-29: 510 mg via INTRAVENOUS
  Filled 2018-07-29: qty 17

## 2018-08-09 DIAGNOSIS — R042 Hemoptysis: Secondary | ICD-10-CM | POA: Diagnosis not present

## 2018-08-09 DIAGNOSIS — J383 Other diseases of vocal cords: Secondary | ICD-10-CM | POA: Diagnosis not present

## 2018-08-09 DIAGNOSIS — I770 Arteriovenous fistula, acquired: Secondary | ICD-10-CM | POA: Diagnosis not present

## 2018-08-09 DIAGNOSIS — Z87891 Personal history of nicotine dependence: Secondary | ICD-10-CM | POA: Diagnosis not present

## 2018-08-16 DIAGNOSIS — N189 Chronic kidney disease, unspecified: Secondary | ICD-10-CM | POA: Diagnosis not present

## 2018-08-16 DIAGNOSIS — E109 Type 1 diabetes mellitus without complications: Secondary | ICD-10-CM | POA: Diagnosis not present

## 2018-08-16 DIAGNOSIS — I10 Essential (primary) hypertension: Secondary | ICD-10-CM | POA: Diagnosis not present

## 2018-08-16 DIAGNOSIS — E7800 Pure hypercholesterolemia, unspecified: Secondary | ICD-10-CM | POA: Diagnosis not present

## 2018-08-19 NOTE — Telephone Encounter (Signed)
Refaxed letter today

## 2018-08-22 ENCOUNTER — Telehealth: Payer: Self-pay

## 2018-08-22 NOTE — Telephone Encounter (Signed)
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  I agree with that.  Does he have a date for the ENT surgery - I think we should wait and perhaps even reschedule his colonoscopy until his ENT procedure is done.  I have some concern about timing of sedation in [facility] after a vocal cord biosy as well so will cc Cathlyn Parsons CRNA for his gudiance

## 2018-08-22 NOTE — Telephone Encounter (Signed)
I called to tell Daniel Santos that I had gotten Plavix clearance for 5 days, resuming it 2 days post procedure per [name], NP. He informed me that tomorrow he's getting his teeth pulled and his ENT Dr [name] is going to do a vocal cord biopsy soon. She wants him to have an EGD along with the colonoscopy. He is scheduled for 09/20/2018 at 4:00pm. Please advise. The ENT notes are in care everywhere.

## 2018-08-22 NOTE — Telephone Encounter (Signed)
We received a fax today from [name] NP stated that Daniel Santos can hold his Plavix 5 days prior to his procedure and resume it 2 days after the procedure. Will send this to be scanned into epic.

## 2018-08-23 NOTE — Telephone Encounter (Signed)
I spoke to Daniel Santos and he will call us as soon as he gets a date for the biopsy. I explained what Dr Leone Payor said to me.

## 2018-08-26 NOTE — Telephone Encounter (Signed)
I have called over to Dr Willeen Cass office and spoke to them in regards to this matter. The lady is sending a message to Dr Willeen Cass staff.

## 2018-08-27 NOTE — Telephone Encounter (Signed)
I have spoken to Daniel Santos and he had just hung up with the Dr's office. He said they are trying to set up his vocal cord biopsy asap and he will call me back with the exact date. I told him we're just trying to see if we need to cancel his pre-visit and procedure appointments.

## 2018-08-27 NOTE — Telephone Encounter (Signed)
We got a letter faxed back from Dr Willeen Cass office with the insulin pump instructions and I went over them with Daniel Santos on the phone and told him we will go over them again at his pre-visit. Letter sent to be scanned into epic.

## 2018-08-28 NOTE — Telephone Encounter (Signed)
I spoke with Jodi GeraldsJohn Matheson and informed him of all this information. He is going to call us back with the biopsy date.

## 2018-08-28 NOTE — Telephone Encounter (Signed)
I spoke with Daniel Santos and he said after a vocal cord biopsy it has to be 6-8 weeks to do any procedures. I will notify previsit of this information and cancel his upcoming appointments.

## 2018-09-16 NOTE — Pre-Procedure Instructions (Addendum)
Daniel Santos.  09/16/2018      [facility] [address], [place] - [address] [place] [address] Phone: [phone] Fax: [phone]    Your procedure is scheduled on September 24, 2018.  Report to [facility] Admitting at 820 AM.  Call this number if you have problems the morning of surgery:  [phone]   Remember:  Do not eat or drink after midnight.    Take these medicines the morning of surgery with A SIP OF WATER  Amlodipine (norvasc) Carvedilol (coreg)  Follow your surgeon's instructions on when to hold/resume aspirin and Plavix.  If no instructions were given call the office to determine how they would like to you take aspirin Plavix  7 days prior to surgery STOP taking any Aleve, Naproxen, Ibuprofen, Motrin, Advil, Goody's, BC's, all herbal medications, fish oil, and all vitamins  Patients with Insulin Pumps       For patients with Insulin Pumps: ? Contact your diabetes doctor for specific instructions before surgery. ? Decrease basal insulin rates by 20% at midnight the night before surgery. ? Note that if your surgery is planned to be longer than 2 hours, your insulin pump will be removed and intravenous (IV) insulin will be started and managed by the nurses and anesthesiologist. You will be able to restart your insulin pump once you are awake and able to manage it. ? Make sure to bring insulin pump supplies to the hospital with you in case your site needs to be changed.   [facility]- Preparing For Surgery  Before surgery, you can play an important role. Because skin is not sterile, your skin needs to be as free of germs as possible. You can reduce the number of germs on your skin by washing with CHG (chlorahexidine gluconate) Soap before surgery.  CHG is an antiseptic cleaner which kills germs and bonds with the skin to continue killing germs even after washing.    Oral Hygiene is also  important to reduce your risk of infection.  Remember - BRUSH YOUR TEETH THE MORNING OF SURGERY WITH YOUR REGULAR TOOTHPASTE  Please do not use if you have an allergy to CHG or antibacterial soaps. If your skin becomes reddened/irritated stop using the CHG.  Do not shave (including legs and underarms) for at least 48 hours prior to first CHG shower. It is OK to shave your face.  Please follow these instructions carefully.   1. Shower the NIGHT BEFORE SURGERY and the MORNING OF SURGERY with CHG.   2. If you chose to wash your hair, wash your hair first as usual with your normal shampoo.  3. After you shampoo, rinse your hair and body thoroughly to remove the shampoo.  4. Use CHG as you would any other liquid soap. You can apply CHG directly to the skin and wash gently with a scrungie or a clean washcloth.   5. Apply the CHG Soap to your body ONLY FROM THE NECK DOWN.  Do not use on open wounds or open sores. Avoid contact with your eyes, ears, mouth and genitals (private parts). Wash Face and genitals (private parts)  with your normal soap.  6. Wash thoroughly, paying special attention to the area where your surgery will be performed.  7. Thoroughly rinse your body with warm water from the neck down.  8. DO NOT shower/wash with your normal soap after using and rinsing off the CHG Soap.  9. Pat yourself dry with a CLEAN TOWEL.  10. Wear CLEAN PAJAMAS to bed the night before surgery, wear comfortable clothes the morning of surgery  11. Place CLEAN SHEETS on your bed the night of your first shower and DO NOT SLEEP WITH PETS.    Day of Surgery:  Do not apply any deodorants/lotions.  Please wear clean clothes to the hospital/surgery center.   Remember to brush your teeth WITH YOUR REGULAR TOOTHPASTE.    Do not wear jewelry  Do not wear lotions, powders, or colognes, or deodorant.  Men may shave face and neck.  Do not bring valuables to the hospital.  [facility] is not responsible  for any belongings or valuables.  Contacts, dentures or bridgework may not be worn into surgery.  Leave your suitcase in the car.  After surgery it may be brought to your room.  For patients admitted to the hospital, discharge time will be determined by your treatment team.  Patients discharged the day of surgery will not be allowed to drive home.   Please read over the following fact sheets that you were given.

## 2018-09-17 ENCOUNTER — Encounter (HOSPITAL_COMMUNITY)
Admission: RE | Admit: 2018-09-17 | Discharge: 2018-09-17 | Disposition: A | Discharge status: Home / Self Care | Payer: BLUE CROSS/BLUE SHIELD | Source: Ambulatory Visit | Attending: Otolaryngology | Admitting: Otolaryngology

## 2018-09-17 ENCOUNTER — Other Ambulatory Visit: Payer: Self-pay

## 2018-09-17 ENCOUNTER — Encounter (HOSPITAL_COMMUNITY): Payer: Self-pay

## 2018-09-17 DIAGNOSIS — Z01812 Encounter for preprocedural laboratory examination: Secondary | ICD-10-CM | POA: Insufficient documentation

## 2018-09-17 LAB — BASIC METABOLIC PANEL
ANION GAP: 11 (ref 5–15)
BUN: 53 mg/dL — ABNORMAL HIGH (ref 6–20)
CO2: 21 mmol/L — ABNORMAL LOW (ref 22–32)
Calcium: 9 mg/dL (ref 8.9–10.3)
Chloride: 108 mmol/L (ref 98–111)
Creatinine, Ser: 4.3 mg/dL — ABNORMAL HIGH (ref 0.61–1.24)
GFR calc Af Amer: 16 mL/min — ABNORMAL LOW (ref >60)
GFR, EST NON AFRICAN AMERICAN: 14 mL/min — AB (ref >60)
GLUCOSE: 215 mg/dL — AB (ref 70–99)
Potassium: 4.9 mmol/L (ref 3.5–5.1)
Sodium: 140 mmol/L (ref 135–145)

## 2018-09-17 LAB — CBC
HCT: 39.8 % (ref 39.0–52.0)
Hemoglobin: 11.5 g/dL — ABNORMAL LOW (ref 13.0–17.0)
MCH: 23.4 pg — ABNORMAL LOW (ref 26.0–34.0)
MCHC: 28.9 g/dL — ABNORMAL LOW (ref 30.0–36.0)
MCV: 80.9 fL (ref 80.0–100.0)
NRBC: 0 % (ref 0.0–0.2)
Platelets: 187 10*3/uL (ref 150–400)
RBC: 4.92 MIL/uL (ref 4.22–5.81)
RDW: 15.6 % — ABNORMAL HIGH (ref 11.5–15.5)
WBC: 5.2 10*3/uL (ref 4.0–10.5)

## 2018-09-17 LAB — GLUCOSE, CAPILLARY: Glucose-Capillary: 181 mg/dL — ABNORMAL HIGH (ref 70–99)

## 2018-09-17 NOTE — Progress Notes (Addendum)
PCP - [name], FNP Cardiologist - Chilton Si, MD Endocrinologist-Balan, Criss Alvine, MD Nephrologist: Terrial Rhodes, MD  Chest x-ray - 07/02/2018 -results under Care Everywhere EKG - 04/10/18 -requested from [facility]-Dr. Lyn Henri  Stress Test - 05/10/18 -results under Care Everywhere ECHO - 05/10/18 -results under Care Everywhere  Cardiac Cath - denies  Sleep Study - pt denies CPAP - n/a  Fasting Blood Sugar - it varies-patient works night shift so he reports he isn't ever fasting.... he is on insulin pump  Patient is on insulin pump, contacted diabetes coordinator Yehuda Mao).  She advises per hospital protocol to decrease basal rate by 20% midnight the night prior to surgery and pump can be left on for procedure.  Also advised patient contact his endocrinologist for further instructions because he may not need to turn pump off at all.  I advised patient to contact his endocrinologist (Dr. Talmage Nap) to get specific instructions and he verbalized understanding.  Blood Thinner Instructions: Plavix-hold 5 days prior to procedure Aspirin Instructions: Aspirin 325 mg-Called Dr. Damien Fusi office and left a message for Epes requesting instructions, also advised patient to call her office and he said he would do so Office returned call patient DOES NOT have to stop Aspirin 325 mg per Dr. Jenne Pane (on call for Dr. Doran Heater). Their office is calling Mr. Stadnik to make him aware  Anesthesia review: Yes-requested EKG, Heart hx, f/u Aspirin orders  Patient denies shortness of breath, fever, cough and chest pain at PAT appointment  Patient verbalized understanding of instructions that were given to them at the PAT appointment. Patient was also instructed that they will need to review over the PAT instructions again at home before surgery.

## 2018-09-18 NOTE — Progress Notes (Signed)
Anesthesia Chart Review:  Case:  [id number] Date/Time:  09/24/18 1005   Procedures:      DIRECT LARYNGOSCOPY WITH BIOPSY,BRONCHOSOPY (N/A )     BRONCHOSCOPY (N/A )   Anesthesia type:  General   Pre-op diagnosis:  RIGHT VOCAL LESION HEMOPTYSIS   [facility] ROOM 09 / [facility] OR   Surgeon:  Graylin ShiverMarcellino, Amanda J, MD      DISCUSSION: 58 yo male former smoker. Pertinent hx includes HTN, ESRD on HD, CVA x 3, IDDM, ESRD not yet on HD (has LUE AV fistula).  [facility] Nuclear stress 05/10/18 negative for ischemia, normal LV function and wall motion. Echo 03/19/2018 showed EF 60-65%, normal wall motion, mild pulm HTN.  Pt follows with Dr. Arrie Aranoladonato for nephrology care. He is very close to needing dialysis treatment, creatinine now consistently in the 4+ range. He has a LUE AV fistula that is ready for dialysis use if needed. He was cleared for surgery 09/19/18 stating " 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  He is medically clear from our standpoint to undergo direct laryngoscopy with biopsy."  Anticipate he can proceed as planned barring acute status change.  VS: BP 127/67   Pulse 68   Temp 36.4 C   Resp 18   Ht 5\' 9"  (1.753 m)   Wt 60.2 kg   SpO2 100%   BMI 19.61 kg/m   PROVIDERS: Maryagnes AmosStarkes-Perry, Takia S, FNP is PCP, medically cleared pt for surgery  Dorisann FramesBalan, Bindubal, MD is Endocrinologist  Terrial Rhodesoladonato, Joseph, MD is Nephrologist  LABS: Elevated creatinine c/w pt's ESRD. (all labs ordered are listed, but only abnormal results are displayed)  Labs Reviewed  GLUCOSE, CAPILLARY - Abnormal; Notable for the following components:      Result Value   Glucose-Capillary 181 (*)    All other components within normal limits  CBC - Abnormal; Notable for the following components:   Hemoglobin 11.5 (*)    MCH 23.4 (*)    MCHC 28.9 (*)    RDW 15.6 (*)    All other components within normal limits  BASIC METABOLIC PANEL  - Abnormal; Notable for the following components:   CO2 21 (*)    Glucose, Bld 215 (*)    BUN 53 (*)    Creatinine, Ser 4.30 (*)    GFR calc non Af Amer 14 (*)    GFR calc Af Amer 16 (*)    All other components within normal limits     IMAGES: PA and lateral chest 07/02/2018 (care everywhere):  Comparison: 02/04/2010  INDICATION:Hemoptysis  FINDINGS: Both lungs are mildly hyperinflated but clear without infiltrates, pleural effusions or pulmonary nodules. Cardiomediastinal contours are normal. There is no evidence of adenopathy or pneumothorax. No acute bony abnormalities are visible.  Hyperinflation otherwise unremarkable 2 view chest  EKG: 04/10/2018 (copy on pt chart): Sinus rhythm. Rate 69. RAE.  CV: Nuclear stress 05/10/2018 (care everywhere): SUMMARY The patient had no chest pain during stress The patient achieved 85 % of maximum predicted heart rate. The baseline ECG displays normal sinus rhythm w/ occasional premature  ventricular complexes. Negative stress ECG for inducible ischemia at target heart rate. Normal left ventricular function and global wall motion with stress. Negative dobutamine echocardiography for inducible ischemia at target heart  rate.  TTE 03/19/2018 (care everywhere): SUMMARY The left ventricular size is normal. There is normal left ventricular wall thickness. LV ejection fraction =  60-65%.  Left ventricular systolic function is normal. The left ventricular wall motion is normal.  Left ventricular filling pattern is prolonged relaxation. The right ventricle is normal size. The right ventricular systolic function is normal. The left atrium is mildly dilated. There is mild tricuspid regurgitation. Estimated right ventricular systolic pressure is 49 mmHg. Mild pulmonary hypertension. The IVC is normal in size with an inspiratory collapse of greater then 50%,  suggesting normal right atrial pressure. There is no pericardial effusion. There is  no comparison study available.  Past Medical History:  Diagnosis Date  . Atypical chest pain 12/14/2015  . Diabetes mellitus without complication (HCC)    Type I  . 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 History of non-insulin dependent diabetes mellitus   . Hypercholesteremia   . Hypertension   . Palpitations 12/14/2015  . Renal disorder   . Stroke [facility])     Past Surgical History:  Procedure Laterality Date  . BASCILIC VEIN TRANSPOSITION Left 10/26/2017   Procedure: BASILIC VEIN TRANSPOSITION FIRST STAGE LEFT;  Surgeon: Larina Earthly, MD;  Location: [facility] OR;  Service: Vascular;  Laterality: Left;  . BASCILIC VEIN TRANSPOSITION Left 01/18/2018   Procedure: SECOND STAGEBASILIC VEIN TRANSPOSITION LEFT ARM;  Surgeon: Larina Earthly, MD;  Location[facility] OR;  Service: Vascular;  Laterality: Left;  . COLONOSCOPY    . DRAINAGE AND CLOSURE OF LYMPHOCELE Left 11/21/2017   Procedure: DRAINAGE AND CLOSURE OF LYMPHOCELE OF LEFT UPPER ARM;  Surgeon: Sherren Kerns, MD;  Location: [facility] OR;  Service: Vascular;  Laterality: Left;  . LUMBAR SPINE SURGERY  2005, 2008, 2010  . REPAIR OF FRACTURED PENIS  05/2000   with cystoscopy    MEDICATIONS: . amLODipine (NORVASC) 10 MG tablet  . aspirin EC 325 MG EC tablet  . atorvastatin (LIPITOR) 40 MG tablet  . BUTRANS 20 MCG/HR PTWK patch  . calcitRIOL (ROCALTROL) 0.25 MCG capsule  . carvedilol (COREG) 25 MG tablet  . clopidogrel (PLAVIX) 75 MG tablet  . ferrous sulfate 325 (65 FE) MG tablet  . furosemide (LASIX) 40 MG tablet  . insulin lispro (HUMALOG) 100 UNIT/ML injection  . LINZESS 145 MCG CAPS capsule   No current facility-administered medications for this encounter.     Zannie Cove [facility]/Anesthesiology Phone [phone] 09/23/2018 11:24 AM

## 2018-09-20 ENCOUNTER — Encounter: Payer: BLUE CROSS/BLUE SHIELD | Admitting: Internal Medicine

## 2018-09-23 NOTE — Anesthesia Preprocedure Evaluation (Addendum)
Anesthesia Evaluation  Patient identified by MRN, date of birth, ID band Patient awake    Reviewed: Allergy & Precautions, NPO status , Patient's Chart, lab work & pertinent test results  Airway Mallampati: III  TM Distance: >3 FB Neck ROM: Full    Dental no notable dental hx. (+) Poor Dentition, Dental Advisory Given   Pulmonary neg pulmonary ROS, former smoker,    Pulmonary exam normal breath sounds clear to auscultation       Cardiovascular hypertension, Pt. on medications negative cardio ROS Normal cardiovascular exam Rhythm:Regular Rate:Normal  EKG: 04/10/2018 (copy on pt chart): Sinus rhythm. Rate 69. RAE.  Nuclear stress 05/10/2018 (care everywhere): The patient had no chest pain during stress The patient achieved 85 % of maximum predicted heart rate. The baseline ECG displays normal sinus rhythm w/ occasional premature ventricular complexes. Negative stress ECG for inducible ischemia at target heart rate.  Normal left ventricular function and global wall motion with stress. Negative dobutamine echocardiography for inducible ischemia at target heart  rate.  TTE 03/19/2018 (care everywhere): The left ventricular size is normal. There is normal left ventricular wall thickness. LV ejection fraction = 60-65%. Left ventricular systolic function is normal. The left ventricular wall motion is normal. Left ventricular filling pattern is prolonged relaxation. The right ventricle is normal size. The right ventricular systolic function is normal. The left atrium is mildly dilated. There is mild tricuspid regurgitation. Estimated right ventricular systolic pressure is 49 mmHg. Mild pulmonary hypertension. The IVC is normal in size with an inspiratory collapse of greater then 50%,  suggesting normal right atrial pressure. There is no pericardial effusion. There is no comparison study available.   Neuro/Psych CVA (on plavix),  No Residual Symptoms negative psych ROS   GI/Hepatic negative GI ROS, Neg liver ROS,   Endo/Other  diabetes (has insulin pump, currently off), Type 1, Insulin Dependent  Renal/GU ESRFRenal disease (has left arm AVF, not currently on dialysis)  negative genitourinary   Musculoskeletal negative musculoskeletal ROS (+)   Abdominal   Peds  Hematology  (+) Blood dyscrasia, anemia ,   Anesthesia Other Findings Right vocal lesion hemoptysis  On butrans patch  Reproductive/Obstetrics                           Anesthesia Physical Anesthesia Plan  ASA: III  Anesthesia Plan: General   Post-op Pain Management:    Induction: Intravenous  PONV Risk Score and Plan: 2 and Ondansetron, Dexamethasone and Midazolam  Airway Management Planned: Oral ETT  Additional Equipment:   Intra-op Plan:   Post-operative Plan: Extubation in OR  Informed Consent: I have reviewed the patients History and Physical, chart, labs and discussed the procedure including the risks, benefits and alternatives for the proposed anesthesia with the patient or authorized representative who has indicated his/her understanding and acceptance.     Dental advisory given  Plan Discussed with: CRNA  Anesthesia Plan Comments: ( )       Anesthesia Quick Evaluation

## 2018-09-24 ENCOUNTER — Ambulatory Visit (HOSPITAL_COMMUNITY): Payer: BLUE CROSS/BLUE SHIELD | Admitting: Physician Assistant

## 2018-09-24 ENCOUNTER — Ambulatory Visit (HOSPITAL_COMMUNITY)
Admission: RE | Admit: 2018-09-24 | Discharge: 2018-09-24 | Disposition: A | Discharge status: Home / Self Care | Payer: BLUE CROSS/BLUE SHIELD | Attending: Otolaryngology | Admitting: Otolaryngology

## 2018-09-24 ENCOUNTER — Encounter (HOSPITAL_COMMUNITY): Payer: Self-pay | Admitting: *Deleted

## 2018-09-24 ENCOUNTER — Encounter (HOSPITAL_COMMUNITY)
Admission: RE | Disposition: A | Discharge status: Home / Self Care | Payer: Self-pay | Source: Home / Self Care | Attending: Otolaryngology

## 2018-09-24 ENCOUNTER — Ambulatory Visit (HOSPITAL_COMMUNITY): Payer: BLUE CROSS/BLUE SHIELD | Admitting: Anesthesiology

## 2018-09-24 DIAGNOSIS — Z8673 Personal history of transient ischemic attack (TIA), and cerebral infarction without residual deficits: Secondary | ICD-10-CM | POA: Insufficient documentation

## 2018-09-24 DIAGNOSIS — E7800 Pure hypercholesterolemia, unspecified: Secondary | ICD-10-CM | POA: Diagnosis not present

## 2018-09-24 DIAGNOSIS — Z7902 Long term (current) use of antithrombotics/antiplatelets: Secondary | ICD-10-CM | POA: Diagnosis not present

## 2018-09-24 DIAGNOSIS — R042 Hemoptysis: Secondary | ICD-10-CM | POA: Diagnosis not present

## 2018-09-24 DIAGNOSIS — Z87891 Personal history of nicotine dependence: Secondary | ICD-10-CM | POA: Insufficient documentation

## 2018-09-24 DIAGNOSIS — N186 End stage renal disease: Secondary | ICD-10-CM | POA: Diagnosis not present

## 2018-09-24 DIAGNOSIS — I10 Essential (primary) hypertension: Secondary | ICD-10-CM | POA: Insufficient documentation

## 2018-09-24 DIAGNOSIS — J383 Other diseases of vocal cords: Secondary | ICD-10-CM | POA: Insufficient documentation

## 2018-09-24 DIAGNOSIS — Z9641 Presence of insulin pump (external) (internal): Secondary | ICD-10-CM | POA: Insufficient documentation

## 2018-09-24 DIAGNOSIS — E119 Type 2 diabetes mellitus without complications: Secondary | ICD-10-CM | POA: Insufficient documentation

## 2018-09-24 DIAGNOSIS — Z7982 Long term (current) use of aspirin: Secondary | ICD-10-CM | POA: Insufficient documentation

## 2018-09-24 DIAGNOSIS — I120 Hypertensive chronic kidney disease with stage 5 chronic kidney disease or end stage renal disease: Secondary | ICD-10-CM | POA: Diagnosis not present

## 2018-09-24 DIAGNOSIS — D649 Anemia, unspecified: Secondary | ICD-10-CM | POA: Insufficient documentation

## 2018-09-24 DIAGNOSIS — Z794 Long term (current) use of insulin: Secondary | ICD-10-CM | POA: Diagnosis not present

## 2018-09-24 DIAGNOSIS — I6523 Occlusion and stenosis of bilateral carotid arteries: Secondary | ICD-10-CM | POA: Diagnosis not present

## 2018-09-24 HISTORY — PX: DIRECT LARYNGOSCOPY: SHX5326

## 2018-09-24 LAB — GLUCOSE, CAPILLARY
Glucose-Capillary: 175 mg/dL — ABNORMAL HIGH (ref 70–99)
Glucose-Capillary: 147 mg/dL — ABNORMAL HIGH (ref 70–99)

## 2018-09-24 SURGERY — LARYNGOSCOPY, DIRECT
Anesthesia: General | Site: Mouth

## 2018-09-24 MED ORDER — SUGAMMADEX SODIUM 200 MG/2ML IV SOLN
INTRAVENOUS | Status: DC | PRN
  Administered 2018-09-24: 240.8 mg via INTRAVENOUS

## 2018-09-24 MED ORDER — SODIUM CHLORIDE 0.9 % IV SOLN
INTRAVENOUS | Status: DC
  Administered 2018-09-24: 09:00:00 via INTRAVENOUS

## 2018-09-24 MED ORDER — 0.9 % SODIUM CHLORIDE (POUR BTL) OPTIME
TOPICAL | Status: DC | PRN
  Administered 2018-09-24: 150 mL

## 2018-09-24 MED ORDER — FENTANYL CITRATE (PF) 250 MCG/5ML IJ SOLN
INTRAMUSCULAR | Status: DC | PRN
  Administered 2018-09-24: 50 ug via INTRAVENOUS

## 2018-09-24 MED ORDER — PROPOFOL 1000 MG/100ML IV EMUL
INTRAVENOUS | Status: AC
  Filled 2018-09-24: qty 100

## 2018-09-24 MED ORDER — EPINEPHRINE 30 MG/30ML IJ SOLN
INTRAMUSCULAR | Status: DC | PRN
  Administered 2018-09-24: 20 mL

## 2018-09-24 MED ORDER — SODIUM CHLORIDE 0.9 % IV SOLN
INTRAVENOUS | Status: DC | PRN
  Administered 2018-09-24: 25 ug/min via INTRAVENOUS

## 2018-09-24 MED ORDER — FENTANYL CITRATE (PF) 100 MCG/2ML IJ SOLN: 25.0000 ug | INTRAMUSCULAR | Status: DC | PRN

## 2018-09-24 MED ORDER — ONDANSETRON HCL 4 MG/2ML IJ SOLN
INTRAMUSCULAR | Status: DC | PRN
  Administered 2018-09-24: 4 mg via INTRAVENOUS

## 2018-09-24 MED ORDER — LACTATED RINGERS IV SOLN
INTRAVENOUS | Status: DC | PRN
  Administered 2018-09-24 (×2): via INTRAVENOUS

## 2018-09-24 MED ORDER — MIDAZOLAM HCL 2 MG/2ML IJ SOLN
INTRAMUSCULAR | Status: AC
  Filled 2018-09-24: qty 2

## 2018-09-24 MED ORDER — MIDAZOLAM HCL 5 MG/5ML IJ SOLN
INTRAMUSCULAR | Status: DC | PRN
  Administered 2018-09-24: 2 mg via INTRAVENOUS

## 2018-09-24 MED ORDER — KETAMINE HCL 10 MG/ML IJ SOLN
INTRAMUSCULAR | Status: DC | PRN
  Administered 2018-09-24: 20 mg via INTRAVENOUS

## 2018-09-24 MED ORDER — FENTANYL CITRATE (PF) 250 MCG/5ML IJ SOLN
INTRAMUSCULAR | Status: AC
  Filled 2018-09-24: qty 5

## 2018-09-24 MED ORDER — EPINEPHRINE PF 1 MG/ML IJ SOLN
INTRAMUSCULAR | Status: AC
  Filled 2018-09-24: qty 1

## 2018-09-24 MED ORDER — PROPOFOL 10 MG/ML IV BOLUS
INTRAVENOUS | Status: DC | PRN
  Administered 2018-09-24: 20 mg via INTRAVENOUS

## 2018-09-24 MED ORDER — PHENYLEPHRINE 40 MCG/ML (10ML) SYRINGE FOR IV PUSH (FOR BLOOD PRESSURE SUPPORT)
PREFILLED_SYRINGE | INTRAVENOUS | Status: DC | PRN
  Administered 2018-09-24: 200 ug via INTRAVENOUS

## 2018-09-24 MED ORDER — SUCCINYLCHOLINE CHLORIDE 20 MG/ML IJ SOLN
INTRAMUSCULAR | Status: DC | PRN
  Administered 2018-09-24: 120 mg via INTRAVENOUS

## 2018-09-24 MED ORDER — DEXMEDETOMIDINE HCL IN NACL 200 MCG/50ML IV SOLN
INTRAVENOUS | Status: DC | PRN
  Administered 2018-09-24 (×5): 8 ug via INTRAVENOUS

## 2018-09-24 MED ORDER — PROPOFOL 10 MG/ML IV BOLUS
INTRAVENOUS | Status: AC
  Filled 2018-09-24: qty 20

## 2018-09-24 MED ORDER — EPINEPHRINE HCL (NASAL) 0.1 % NA SOLN
NASAL | Status: AC
  Filled 2018-09-24: qty 30

## 2018-09-24 MED ORDER — KETAMINE HCL 50 MG/5ML IJ SOSY
PREFILLED_SYRINGE | INTRAMUSCULAR | Status: AC
  Filled 2018-09-24: qty 5

## 2018-09-24 MED ORDER — LIDOCAINE 2% (20 MG/ML) 5 ML SYRINGE
INTRAMUSCULAR | Status: AC
  Filled 2018-09-24: qty 5

## 2018-09-24 MED ORDER — DEXMEDETOMIDINE HCL IN NACL 200 MCG/50ML IV SOLN
INTRAVENOUS | Status: AC
  Filled 2018-09-24: qty 50

## 2018-09-24 MED ORDER — ACETAMINOPHEN 500 MG PO TABS: 1000.0000 mg | ORAL_TABLET | Freq: Once | ORAL | Status: DC

## 2018-09-24 MED ORDER — ROCURONIUM BROMIDE 50 MG/5ML IV SOSY
PREFILLED_SYRINGE | INTRAVENOUS | Status: DC | PRN
  Administered 2018-09-24: 30 mg via INTRAVENOUS

## 2018-09-24 MED ORDER — DEXAMETHASONE SODIUM PHOSPHATE 10 MG/ML IJ SOLN
INTRAMUSCULAR | Status: DC | PRN
  Administered 2018-09-24: 10 mg via INTRAVENOUS

## 2018-09-24 SURGICAL SUPPLY — 23 items
BALLN PULM 15 16.5 18X75 (BALLOONS)
BALLOON PULM 15 16.5 18X75 (BALLOONS) IMPLANT
CANISTER SUCT 3000ML PPV (MISCELLANEOUS) ×3 IMPLANT
CONT SPEC 4OZ CLIKSEAL STRL BL (MISCELLANEOUS) ×2 IMPLANT
COVER BACK TABLE 60X90IN (DRAPES) ×3 IMPLANT
COVER MAYO STAND STRL (DRAPES) ×3 IMPLANT
COVER WAND RF STERILE (DRAPES) ×1 IMPLANT
DRAPE HALF SHEET 40X57 (DRAPES) ×5 IMPLANT
DRSG TELFA 3X8 NADH (GAUZE/BANDAGES/DRESSINGS) ×3 IMPLANT
GAUZE 4X4 16PLY RFD (DISPOSABLE) ×3 IMPLANT
GLOVE BIO SURGEON STRL SZ 6.5 (GLOVE) ×3 IMPLANT
GUARD TEETH (MISCELLANEOUS) ×2 IMPLANT
KIT BASIN OR (CUSTOM PROCEDURE TRAY) ×3 IMPLANT
KIT TURNOVER KIT B (KITS) ×2 IMPLANT
NS IRRIG 1000ML POUR BTL (IV SOLUTION) ×3 IMPLANT
PAD ARMBOARD 7.5X6 YLW CONV (MISCELLANEOUS) ×6 IMPLANT
PAD DRESSING TELFA 3X8 NADH (GAUZE/BANDAGES/DRESSINGS) IMPLANT
PATTIES SURGICAL .5X1.5 (GAUZE/BANDAGES/DRESSINGS) ×3 IMPLANT
SOLUTION ANTI FOG 6CC (MISCELLANEOUS) ×3 IMPLANT
SURGILUBE 2OZ TUBE FLIPTOP (MISCELLANEOUS) IMPLANT
TOWEL NATURAL 6PK STERILE (DISPOSABLE) ×3 IMPLANT
TUBE CONNECTING 12X1/4 (SUCTIONS) ×3 IMPLANT
WATER STERILE IRR 1000ML POUR (IV SOLUTION) ×1 IMPLANT

## 2018-09-24 NOTE — H&P (Signed)
The surgical history remains accurate and without interval change. The condition still exists which makes the procedure necessary. The patient and/or family is aware of their condition and has been informed of the risks and benefits of surgery, as well as alternatives. All parties have elected to proceed with surgery.   *He has noted improvement in his spitting of blood since having dental extractions performed.   Surgical plan: direct laryngoscopy with biopsy, bronchoscopy  Otolaryngology Return Note  Subjective: [name] is a 58 y.o. male is seen in consultation at the request of Laretta Bolster* for evaluation of cough. Previously saw Dr. Redmond Baseman for nasal symptoms one year ago.  There is globus sensation. Had coughed up a blood clot 2 months ago, in the evening.This began approximately 2 months ago. He can now sit up a little bit of blood on a regular basis now. Usually, his sputum is clear. He will cough and make himself spit and often blood comes out. There is a left posterior molar which is loose and bleeds on occasion as well. Normal CXR as ordered by PCP. No recent abx. Denies dysphagia, otalgia, neck masses. There is dysphonia. It is worse with talking. Improved with nothing.   Does not struggle with vocal projection. No fatigue. No significant pitch changes.   GERD history no. There is throat clearing. No heartburn. + globus sensation. GERD Medications: none.  Quit smoking 15 months ago. EtOH- rare (2 beers/year). No other drugs. Currently on plavix (CVA x 3). Has a left fistula. Not on dialysis yet.   Scheduled for colonoscopy in February. To his knowledge, no upper endoscopy is scheduled at this time.  Past Medical History:  Diagnosis Date  . Brain TIA  . Carotid stenosis, bilateral  . Diabetes mellitus ([place])  . Former smoker  . High cholesterol  . History of CVA (cerebrovascular accident)  . HTN (hypertension)  . Hypertension  . IDDM (insulin  dependent diabetes mellitus) ([place])  . Pre-transplant evaluation for kidney and pancreas transplant   Past Surgical History:  Procedure Laterality Date  . BACK SURGERY  . PENIS SURGERY   Family History  Problem Relation Age of Onset  . Cancer Mother  . Kidney disease Father  . Diabetes Paternal Aunt  . Cancer Maternal Grandmother  . Diabetes Paternal Grandmother  . Stroke Paternal Grandmother  . Cancer Paternal Grandmother   Social History   Socioeconomic History  . Marital status: Single  Spouse name: Not on file  . Number of children: Not on file  . Years of education: Not on file  . Highest education level: Not on file  Occupational History  . Not on file  Social Needs  . Financial resource strain: Not on file  . Food insecurity:  Worry: Not on file  Inability: Not on file  . Transportation needs:  Medical: Not on file  Non-medical: Not on file  Tobacco Use  . Smoking status: Former Smoker  Packs/day: 1.00  Years: 30.00  Pack years: 30.00  Types: Cigarettes  Start date: 08/08/1975  Last attempt to quit: 04/2017  Years since quitting: 1.3  . Smokeless tobacco: Never Used  Substance and Sexual Activity  . Alcohol use: No  . Drug use: No  . Sexual activity: Not on file  Lifestyle  . Physical activity:  Days per week: Not on file  Minutes per session: Not on file  . Stress: Not on file  Relationships  . Social connections:  Talks on phone: Not  on file  Gets together: Not on file  Attends religious service: Not on file  Active member of club or organization: Not on file  Attends meetings of clubs or organizations: Not on file  Relationship status: Not on file  Other Topics Concern  . Not on file  Social History Narrative  . Not on file   Allergies  Allergen Reactions  . Doxycycline Nausea (intolerance)  (kidney function?)   Updated Medication List:   amLODIPine (NORVASC) 10 MG tablet  Sig - Route: Take 10 mg by mouth. - Oral  Class: Historical  Med  aspirin 325 MG EC tablet *ANTIPLATELET*  Sig - Route: Take 325 mg by mouth. - Oral  Class: Historical Med  atorvastatin (LIPITOR) 40 MG tablet  Sig - Route: Take 40 mg by mouth. - Oral  Class: Historical Med  buprenorphine (BUTRANS) 20 mcg/hour PTWK  Sig - Route: Place 20 mcg onto the skin. - Transdermal  Class: Historical Med  calcitriol (ROCALTROL) 0.25 MCG capsule  Sig - Route: Take 0.25 mcg by mouth. - Oral  Class: Historical Med  carvedilol (COREG) 25 MG tablet  Sig - Route: Take 25 mg by mouth. - Oral  Class: Historical Med  clopidogrel (PLAVIX) 75 mg tablet *ANTIPLATELET*  Sig - Route: Take 75 mg by mouth. - Oral  Class: Historical Med  CONTOUR NEXT TEST STRIPS Strp  Sig:  Class: Historical Med  ferrous sulfate (FERATAB) 325 (65 FE) MG tablet  Sig - Route: Take 325 mg by mouth. - Oral  Class: Historical Med  furosemide (LASIX) 40 MG tablet  Sig - Route: Take 40 mg by mouth daily. - Oral  Class: Historical Med  insulin pump-infus. set-meter Kit  Sig - Route: Inject into the skin. - Subcutaneous  Class: Historical Med  linaclotide (LINACLOTIDE) 145 mcg capsule  Sig - Route: Take 145 mcg by mouth. - Oral  Class: Historical Med  mometasone (NASONEX) 50 mcg/actuation nasal spray  Sig: INSTILL 1-2 SPRAYS IN THE NOSTRILS TWICE A DAY  Class: Historical Med  amoxicillin-clavulanate (AUGMENTIN) 500-125 mg per tablet  Sig - Route: Take 1 tablet by mouth 2 times daily. - Oral    ROS A complete review of systems was conducted and was negative except as stated in the HPI.   Objective: Vitals:  08/09/18 1102  BP: 135/70  Pulse: 113  Resp: 16  Height: 1.753 m (_0 )  Weight: 60.8 kg (134 lb)  BMI (Calculated): 19.8   Physical Exam:  General Normocephalic, Awake, Alert and appropriate for the exam  Eyes PERRL, no scleral icterus or conjunctival hemorrhage.  EOMI.  Ears Right ear- EAC patent, no obstructing cerumen. TM: intact, no effusion, no retraction, normal  landmarks Left ear- EAC patent, no obstructing cerumen. TM: intact, no effusion, no retraction, normal landmarks  Nose Patent, No polyps or masses seen.  Oral Pharynx No mucosal lesions or tumors seen. Dentition is very poor. There is extensive root degeneration and visualization on the left posterior molars. Most of his teeth are very carious. Mirror laryngeal examination reveals nothing due to gag  Lymphatics No cervical lymphadenopathy or masses on palpation  Endocrine No thyroidmegaly, no thyroid masses palpated  Cardio-vascular No cyanosis, regular rate  Pulmonary No audible stridor, Breathing easily with no labor. No dysphonia.  Neuro Symmetric facial movement.  Tongue protrudes in midline.  Psychiatry Appropriate affect and mood for clinic visit.  Skin No scars or lesions on face or neck.    Comparison: 02/04/2010  INDICATION:Hemoptysis  FINDINGS: Both lungs are mildly hyperinflated but clear without infiltrates, pleural effusions or pulmonary nodules. Cardiomediastinal contours are normal. There is no evidence of adenopathy or pneumothorax. No acute bony abnormalities are visible.  Hyperinflation otherwise unremarkable 2 view chest  Electronically Signed by: Delma Post   GI records with Dr. Carlean Purl reviewed- Colonoscopy scheduled in February.   Assessment:  My impression is that [place] has  1. Spitting up blood  2. Former smoker  3. Lesion of true vocal cord  .    Plan:  1. SM DL with bronchoscopy and biopsy of right infraglottic vocal cord lesion. This should be done at the [facility] operating room. Patient hold his Plavix 3 days before. He should be checking with his primary care in regards to this as he will have to be off his Plavix for 5 days before his colonoscopy 2. If the bronchoscopy fails to demonstrate any particular reason for his bleeding, I think he would likely benefit from upper endoscopy at the time of his colonoscopy. Patient will discuss this with  Dr. Carlean Purl as well.

## 2018-09-24 NOTE — Anesthesia Postprocedure Evaluation (Signed)
Anesthesia Post Note  Patient: Daniel Santos.  Procedure(s) Performed: DIRECT LARYNGOSCOPY WITH BIOPSY,BRONCHOSOPY (N/A Mouth)     Patient location during evaluation: PACU Anesthesia Type: General Level of consciousness: awake and alert Pain management: pain level controlled Vital Signs Assessment: post-procedure vital signs reviewed and stable Respiratory status: spontaneous breathing, nonlabored ventilation, respiratory function stable and patient connected to nasal cannula oxygen Cardiovascular status: blood pressure returned to baseline and stable Postop Assessment: no apparent nausea or vomiting Anesthetic complications: no    Last Vitals:  Vitals:   09/24/18 1200 09/24/18 1211  BP: 117/76 119/75  Pulse: 79 72  Resp: 19   Temp:  (!) 36.4 C  SpO2: 98% 98%    Last Pain:  Vitals:   09/24/18 1200  TempSrc:   PainSc: 0-No pain                 Chelsey L Woodrum

## 2018-09-24 NOTE — Discharge Instructions (Signed)
Advance diet as tolerated.  You may use over the counter pain relievers for pain.   Ice packs to neck if pain continues.   Go to the ER if you develop any shortness of breath.

## 2018-09-24 NOTE — Transfer of Care (Signed)
Immediate Anesthesia Transfer of Care Note  Patient: Daniel Santos.  Procedure(s) Performed: DIRECT LARYNGOSCOPY WITH BIOPSY,BRONCHOSOPY (N/A Mouth)  Patient Location: PACU  Anesthesia Type:General  Level of Consciousness: awake, oriented and patient cooperative  Airway & Oxygen Therapy: Patient Spontanous Breathing and Patient connected to nasal cannula oxygen  Post-op Assessment: Report given to RN and Post -op Vital signs reviewed and stable  Post vital signs: Reviewed and stable  Last Vitals:  Vitals Value Taken Time  BP 132/77 09/24/2018 11:22 AM  Temp    Pulse 80 09/24/2018 11:25 AM  Resp 19 09/24/2018 11:25 AM  SpO2 100 % 09/24/2018 11:25 AM  Vitals shown include unvalidated device data.  Last Pain:  Vitals:   09/24/18 0915  TempSrc:   PainSc: 0-No pain         Complications: No apparent anesthesia complications

## 2018-09-24 NOTE — Op Note (Signed)
DATE OF PROCEDURE:  09/24/2018    PRE-OPERATIVE DIAGNOSIS:  RIGHT VOCAL LESION HEMOPTYSIS    POST-OPERATIVE DIAGNOSIS:  Same    PROCEDURE(S): Direct laryngoscopy with operating telescope with biopsy Tracheoscopy Intubation by the surgeon   SURGEON:  Misty Stanley, MD    ASSISTANT(S):  none    ANESTHESIA:  General endotracheal anesthesia       ESTIMATED BLOOD LOSS:  scant   SPECIMENS:  Right infraglottic lesion    COMPLICATIONS:  None    OPERATIVE FINDINGS:   The patient was an easy mask and straightforward intubation. The glottis was rather anterior. The anterior commissure scope was utilized with 2 clicks of the head of the bed forward.   There was a 31mm right infraglottic mass, not involving the commissure. Appearing to be mucous-filled.   OPERATIVE DETAILS: The patient was brought to the operating room and placed in the supine position. General anesthesia was induced. Eyetape, eyepads, and toothguard were placed. The Dedo laryngoscope was introduced and the patient was intubated over a telescope with a 5-0 MLT tube. End tidal and bilateral breath-sounds were confirmed. Palpation of tongue, palate, tonsils, and epiglottis was performed without any abnormalities noted. The glottis was anterior and therefore the anterior commissure scope was then used to visualize and suspend the larynx with the Louie suspension arm. The infraglottic lesion was noted. This was excised with up-biting forceps and passed off the field. Hemostasis was achieved with epi-soaked patties. The larynx and trachea were sprayed with 2% lidocaine on a Madd syringe. Next, the endotracheal tube was removed. The trachea and mainstem bronchi were visualized with the telescope and found to be without any concerning lesions. The endotracheal tube was replaced. All instrumentation was removed. The patient was then returned to the care of the anesthesia staff, bed rotated 90 degrees back to  anesthesia. He was then awakened and transported to PACU in good condition.

## 2018-09-25 ENCOUNTER — Encounter (HOSPITAL_COMMUNITY): Payer: Self-pay | Admitting: Otolaryngology

## 2018-10-03 ENCOUNTER — Telehealth: Payer: Self-pay | Admitting: Internal Medicine

## 2018-10-03 NOTE — Telephone Encounter (Signed)
Pt stated he "has a note to call you after he gets results from biopsy."

## 2018-10-03 NOTE — Telephone Encounter (Signed)
Left him a message to call me back. 

## 2018-10-04 NOTE — Telephone Encounter (Signed)
Daniel Santos had his vocal cord biopsy done 09/24/2018 and it was benign. It's in epic. He said he had issues with his sugar being in the 300's after that and had to pump 10 times as much insulin to get it regulated. I told him to call Dr Talmage Nap and discuss this with them. Per Daniel Santos they gave him gas and propofol for the biopsy. He said they want him to have an EGD along with the colonoscopy Dr Leone Payor wanted to have done. He understands that we have to wait 6-8 weeks post biopsy to do any procedures per Daniel Santos. Will route to Dr Leone Payor.

## 2018-10-07 NOTE — Telephone Encounter (Signed)
Set him up for mid-April EGD and colonoscopy  I have left instructions on how I want him to prep etc including using Linzess prior to colonoscopy - in my last note  Previsit nurses will need to use those instructions and also need guidance from Dr. Talmage Nap re: how to adjust insulin

## 2018-10-08 ENCOUNTER — Encounter: Payer: Self-pay | Admitting: Internal Medicine

## 2018-10-08 NOTE — Telephone Encounter (Signed)
I spoke with Daniel Santos and we set up a pre-visit appointment for 11/06/2018 at 10:30AM and colonoscopy with Dr Leone Payor on 11/22/2018 at 2:00pm. I have put copies of the Plavix clearance and the insulin pump instruction letters in the pre-visit folder. He is also to use his Linzess regularly for the 4 days prior to his colonoscopy, see last office note with Dr Leone Payor. Information about these  appointments will be mailed out to [facility] with date/time.

## 2018-10-15 DIAGNOSIS — N184 Chronic kidney disease, stage 4 (severe): Secondary | ICD-10-CM | POA: Diagnosis not present

## 2018-10-15 DIAGNOSIS — N189 Chronic kidney disease, unspecified: Secondary | ICD-10-CM | POA: Diagnosis not present

## 2018-10-21 DIAGNOSIS — N184 Chronic kidney disease, stage 4 (severe): Secondary | ICD-10-CM | POA: Diagnosis not present

## 2018-10-21 DIAGNOSIS — N2581 Secondary hyperparathyroidism of renal origin: Secondary | ICD-10-CM | POA: Diagnosis not present

## 2018-10-21 DIAGNOSIS — I129 Hypertensive chronic kidney disease with stage 1 through stage 4 chronic kidney disease, or unspecified chronic kidney disease: Secondary | ICD-10-CM | POA: Diagnosis not present

## 2018-10-21 DIAGNOSIS — D631 Anemia in chronic kidney disease: Secondary | ICD-10-CM | POA: Diagnosis not present

## 2018-11-05 ENCOUNTER — Telehealth: Payer: Self-pay | Admitting: *Deleted

## 2018-11-05 NOTE — Telephone Encounter (Signed)
Patient is scheduled for a screening colonoscopy, he states he wants to have the proceed on 11/22/2018. Patient states the colonoscopy is for other reasons not just screening is what he told the scheduler. Patient is on Plavix and has insulin pump. Last ov 07/25/18. Ok to proceed with colonoscopy on 11/22/18 with you? Please advise. Thank you, Renesha Lizama pv

## 2018-11-06 ENCOUNTER — Ambulatory Visit (AMBULATORY_SURGERY_CENTER): Payer: Self-pay

## 2018-11-06 ENCOUNTER — Other Ambulatory Visit: Payer: Self-pay

## 2018-11-06 VITALS — Ht 69.0 in | Wt 135.0 lb

## 2018-11-06 DIAGNOSIS — Z1211 Encounter for screening for malignant neoplasm of colon: Secondary | ICD-10-CM

## 2018-11-06 NOTE — Telephone Encounter (Signed)
Agreed  Will cancel and place on list to be rescheduled and to have an EGD and colonoscopy when elective procedures are back on

## 2018-11-06 NOTE — Telephone Encounter (Signed)
Called and did previsit over the phone this morning , pt verbalize he had been waking up with blood and blood clots in his mouth. Pt went to ENT they found something on vocal cord per pt. Pt had biopsy done of vocal and came back negative. Pt had chest xray and that came back negative. Pt verbalize he decided to look in mirror and notice he had bitten his tongue and figured out that's where the bleeding came from. Pt also had some loose teeth they he had removed the may have caused some of the bleeding. Pt is on a blood thinner. Pt is on a transplant list for pancreas and kidney. He was told he needed to have both procedures done before transplant. Pt verbalized concerns about having any procedures done at this time to to convid 19. Pt said he doesn't really think he needs to be in a rush to have this done now since he has found where the bleeding has come from. Please advised if he needs both procedures and when he needs to be scheduled.

## 2018-11-06 NOTE — Progress Notes (Signed)
No egg or soy allergy known to patient  No issues with past sedation with any surgeries  or procedures, no intubation problems  No diet pills per patient No home 02 use per patient  Pt is taking Plavix has order to discontinue his plavix 5 days prior and resume this medication 2 days after the procedure. Pt complains of constipation due to pain patch, will follow instructions per Dr. Leone Payor for prep No A fib or A flutter  EMMI video sent to pt's e mail , pt declined

## 2018-11-12 NOTE — Telephone Encounter (Signed)
Patient procedures cancelled by Haynes Bast Rn on 11-07-18.

## 2018-11-22 ENCOUNTER — Encounter: Payer: BLUE CROSS/BLUE SHIELD | Admitting: Internal Medicine

## 2018-12-10 ENCOUNTER — Telehealth: Payer: Self-pay | Admitting: *Deleted

## 2018-12-10 NOTE — Telephone Encounter (Signed)
Spoke with patient and he wants to wait to have his procedure done in June or July. He's concerned about COVID. Will notify Dr. Leone Payor.SM

## 2018-12-16 DIAGNOSIS — I10 Essential (primary) hypertension: Secondary | ICD-10-CM | POA: Diagnosis not present

## 2018-12-16 DIAGNOSIS — N189 Chronic kidney disease, unspecified: Secondary | ICD-10-CM | POA: Diagnosis not present

## 2018-12-16 DIAGNOSIS — E7800 Pure hypercholesterolemia, unspecified: Secondary | ICD-10-CM | POA: Diagnosis not present

## 2018-12-16 DIAGNOSIS — E109 Type 1 diabetes mellitus without complications: Secondary | ICD-10-CM | POA: Diagnosis not present

## 2018-12-26 DIAGNOSIS — N184 Chronic kidney disease, stage 4 (severe): Secondary | ICD-10-CM | POA: Diagnosis not present

## 2018-12-26 DIAGNOSIS — N2581 Secondary hyperparathyroidism of renal origin: Secondary | ICD-10-CM | POA: Diagnosis not present

## 2018-12-26 DIAGNOSIS — I129 Hypertensive chronic kidney disease with stage 1 through stage 4 chronic kidney disease, or unspecified chronic kidney disease: Secondary | ICD-10-CM | POA: Diagnosis not present

## 2018-12-26 DIAGNOSIS — D631 Anemia in chronic kidney disease: Secondary | ICD-10-CM | POA: Diagnosis not present

## 2018-12-27 DIAGNOSIS — N184 Chronic kidney disease, stage 4 (severe): Secondary | ICD-10-CM | POA: Diagnosis not present

## 2019-01-17 ENCOUNTER — Other Ambulatory Visit: Payer: Self-pay

## 2019-01-17 ENCOUNTER — Ambulatory Visit: Payer: BC Managed Care – PPO | Admitting: *Deleted

## 2019-01-17 VITALS — Ht 69.0 in | Wt 140.0 lb

## 2019-01-17 DIAGNOSIS — Z1211 Encounter for screening for malignant neoplasm of colon: Secondary | ICD-10-CM

## 2019-01-17 NOTE — Progress Notes (Signed)

## 2019-01-22 ENCOUNTER — Encounter: Payer: Self-pay | Admitting: Internal Medicine

## 2019-01-22 DIAGNOSIS — E109 Type 1 diabetes mellitus without complications: Secondary | ICD-10-CM | POA: Diagnosis not present

## 2019-01-30 ENCOUNTER — Telehealth: Payer: Self-pay | Admitting: Internal Medicine

## 2019-01-30 NOTE — Telephone Encounter (Signed)

## 2019-01-31 ENCOUNTER — Ambulatory Visit (AMBULATORY_SURGERY_CENTER): Payer: BC Managed Care – PPO | Admitting: Internal Medicine

## 2019-01-31 ENCOUNTER — Other Ambulatory Visit: Payer: Self-pay

## 2019-01-31 ENCOUNTER — Encounter: Payer: BC Managed Care – PPO | Admitting: Internal Medicine

## 2019-01-31 ENCOUNTER — Encounter: Payer: Self-pay | Admitting: Internal Medicine

## 2019-01-31 VITALS — BP 122/65 | HR 80 | Temp 98.7°F | Resp 12 | Ht 69.0 in | Wt 134.0 lb

## 2019-01-31 DIAGNOSIS — Z1211 Encounter for screening for malignant neoplasm of colon: Secondary | ICD-10-CM

## 2019-01-31 DIAGNOSIS — K920 Hematemesis: Secondary | ICD-10-CM

## 2019-01-31 DIAGNOSIS — K2951 Unspecified chronic gastritis with bleeding: Secondary | ICD-10-CM | POA: Diagnosis not present

## 2019-01-31 MED ORDER — SODIUM CHLORIDE 0.9 % IV SOLN: 500.0000 mL | INTRAVENOUS | Status: DC

## 2019-01-31 NOTE — Op Note (Signed)
[facility] Patient Name: Daniel GeraldsJohn Santos Procedure Date: 01/31/2019 1:01 PM MRN: [id number] Endoscopist: Iva Booparl E Vondra Aldredge , MD Age: [age] Referring MD:  Date of Birth: 12/15/1960 Gender: Male Account #: [id number] Procedure:                Colonoscopy Indications:              Screening for colorectal malignant neoplasm Medicines:                Propofol per Anesthesia, Monitored Anesthesia Care Procedure:                Pre-Anesthesia Assessment:                           - Prior to the procedure, a History and Physical                            was performed, and patient medications and                            allergies were reviewed. The patient's tolerance of                            previous anesthesia was also reviewed. The risks                            and benefits of the procedure and the sedation                            options and risks were discussed with the patient.                            All questions were answered, and informed consent                            was obtained. Prior Anticoagulants: The patient                            last took Plavix (clopidogrel) 5 days prior to the                            procedure. ASA Grade Assessment: III - A patient                            with severe systemic disease. After reviewing the                            risks and benefits, the patient was deemed in                            satisfactory condition to undergo the procedure.                           After obtaining informed consent, the colonoscope  was passed under direct vision. Throughout the                            procedure, the patient's blood pressure, pulse, and                            oxygen saturations were monitored continuously. The                            Colonoscope was introduced through the anus and                            advanced to the the cecum, identified by   appendiceal orifice and ileocecal valve. The                            colonoscopy was performed without difficulty. The                            patient tolerated the procedure well. The quality                            of the bowel preparation was excellent. The                            ileocecal valve, appendiceal orifice, and rectum                            were photographed. The bowel preparation used was                            Miralax via split dose instruction. Scope In: 1:11:46 PM Scope Out: 1:25:32 PM Scope Withdrawal Time: 0 hours 6 minutes 24 seconds  Total Procedure Duration: 0 hours 13 minutes 46 seconds  Findings:                 The perianal and digital rectal examinations were                            normal. Pertinent negatives include normal prostate                            (size, shape, and consistency).                           The entire examined colon appeared normal.                           Internal hemorrhoids were found during retroflexion.                           The exam was otherwise without abnormality on                            direct and retroflexion views. Complications:  No immediate complications. Estimated Blood Loss:     Estimated blood loss was minimal. Estimated blood                            loss was minimal. Impression:               - The entire examined colon is normal. There was                            some patchy subepithelial hem,e and slight oozing                            in places boyth before and after scope contact. I                            do not think pathologic. Perhaps from residual                            clopidogrel effect despite holding 5 d.                           - No specimens collected. Recommendation:           - Patient has a contact number available for                            emergencies. The signs and symptoms of potential                            delayed  complications were discussed with the                            patient. Return to normal activities tomorrow.                            Written discharge instructions were provided to the                            patient.                           - Resume previous diet.                           - Continue present medications.                           - Resume Plavix (clopidogrel) at prior dose today.                           - Repeat colonoscopy in 10 years for screening                            purposes. Gatha Mayer, MD 01/31/2019 1:38:53 PM This report has been signed electronically.

## 2019-01-31 NOTE — Progress Notes (Signed)
Called to room to assist during endoscopic procedure.  Patient ID and intended procedure confirmed with present staff. Received instructions for my participation in the procedure from the performing physician.  

## 2019-01-31 NOTE — Progress Notes (Signed)
To PACU, VSS. Report to Rn.tb 

## 2019-01-31 NOTE — Op Note (Signed)
[facility] Patient Name: Daniel GeraldsJohn Santos Procedure Date: 01/31/2019 1:01 PM MRN: [id number] Endoscopist: Iva Booparl E Tzivia Oneil , MD Age: 58 Referring MD:  Date of Birth: 06/23/1961 Gender: Male Account #: [id number] Procedure:                Upper GI endoscopy Indications:              Hematemesis Medicines:                Propofol per Anesthesia, Monitored Anesthesia Care Procedure:                Pre-Anesthesia Assessment:                           - Prior to the procedure, a History and Physical                            was performed, and patient medications and                            allergies were reviewed. The patient's tolerance of                            previous anesthesia was also reviewed. The risks                            and benefits of the procedure and the sedation                            options and risks were discussed with the patient.                            All questions were answered, and informed consent                            was obtained. Prior Anticoagulants: The patient                            last took Plavix (clopidogrel) 5 days prior to the                            procedure. ASA Grade Assessment: III - A patient                            with severe systemic disease. After reviewing the                            risks and benefits, the patient was deemed in                            satisfactory condition to undergo the procedure.                           After obtaining informed consent, the endoscope was  passed under direct vision. Throughout the                            procedure, the patient's blood pressure, pulse, and                            oxygen saturations were monitored continuously. The                            Model GIF-[id number]) scope was introduced                            through the mouth, and advanced to the second part                            of duodenum. The  upper GI endoscopy was                            accomplished without difficulty. The patient                            tolerated the procedure well. Scope In: Scope Out: Findings:                 Diffuse mild inflammation characterized by                            congestion (edema), erythema, friability and                            granularity was found in the gastric antrum.                            Biopsies were taken with a cold forceps for                            histology. Verification of patient identification                            for the specimen was done. Estimated blood loss was                            minimal.                           The exam was otherwise without abnormality.                           The cardia and gastric fundus were normal on                            retroflexion. Complications:            No immediate complications. Estimated Blood Loss:     Estimated blood loss was minimal. Impression:               -  Gastritis. Biopsied.                           - The examination was otherwise normal. Recommendation:           - Patient has a contact number available for                            emergencies. The signs and symptoms of potential                            delayed complications were discussed with the                            patient. Return to normal activities tomorrow.                            Written discharge instructions were provided to the                            patient.                           - Resume previous diet.                           - Continue present medications.                           - See the other procedure note for documentation of                            additional recommendations. Iva Booparl E Cowen Pesqueira, MD 01/31/2019 1:35:37 PM This report has been signed electronically.

## 2019-01-31 NOTE — Patient Instructions (Addendum)
There was some stomach inflammation - "gastritis". I took biopsies to see if an infection present. While that could have contributed to spitting up blood I think biting the tongue and having thin blood from Plavix more likely.  All else ok except some internal hemorrhoids swollen (seen in rectum on colonoscopy).  Next routine colonoscopy or other screening test in 10 years - 2030  [facility] you are able to get a transplant soon.  I appreciate the opportunity to care for you. Gatha Mayer, MD, [facility]  Gastritis handout given to patient. Hemorrhoid handout given to patient.  Resume previous diet. Continue present medications.  Resume Plavix at prior dose today.  YOU HAD AN ENDOSCOPIC PROCEDURE TODAY AT [facility]:   Refer to the procedure report that was given to you for any specific questions about what was found during the examination.  If the procedure report does not answer your questions, please call your gastroenterologist to clarify.  If you requested that your care partner not be given the details of your procedure findings, then the procedure report has been included in a sealed envelope for you to review at your convenience later.  YOU SHOULD EXPECT: Some feelings of bloating in the abdomen. Passage of more gas than usual.  Walking can help get rid of the air that was put into your GI tract during the procedure and reduce the bloating. If you had a lower endoscopy (such as a colonoscopy or flexible sigmoidoscopy) you may notice spotting of blood in your stool or on the toilet paper. If you underwent a bowel prep for your procedure, you may not have a normal bowel movement for a few days.  Please Note:  You might notice some irritation and congestion in your nose or some drainage.  This is from the oxygen used during your procedure.  There is no need for concern and it should clear up in a day or so.  SYMPTOMS TO REPORT IMMEDIATELY:   Following lower  endoscopy (colonoscopy or flexible sigmoidoscopy):  Excessive amounts of blood in the stool  Significant tenderness or worsening of abdominal pains  Swelling of the abdomen that is new, acute  Fever of 100F or higher   Following upper endoscopy (EGD)  Vomiting of blood or coffee ground material  New chest pain or pain under the shoulder blades  Painful or persistently difficult swallowing  New shortness of breath  Fever of 100F or higher  Black, tarry-looking stools  For urgent or emergent issues, a gastroenterologist can be reached at any hour by calling [phone].   DIET:  We do recommend a small meal at first, but then you may proceed to your regular diet.  Drink plenty of fluids but you should avoid alcoholic beverages for 24 hours.  ACTIVITY:  You should plan to take it easy for the rest of today and you should NOT DRIVE or use heavy machinery until tomorrow (because of the sedation medicines used during the test).    FOLLOW UP: Our staff will call the number listed on your records 48-72 hours following your procedure to check on you and address any questions or concerns that you may have regarding the information given to you following your procedure. If we do not reach you, we will leave a message.  We will attempt to reach you two times.  During this call, we will ask if you have developed any symptoms of COVID 19. If you develop any symptoms (ie: fever, flu-like  symptoms, shortness of breath, cough etc.) before then, please call [phone].  If you test positive for Covid 19 in the 2 weeks post procedure, please call and report this information to us.    If any biopsies were taken you will be contacted by phone or by letter within the next 1-3 weeks.  Please call us at [phone] if you have not heard about the biopsies in 3 weeks.    SIGNATURES/CONFIDENTIALITY: You and/or your care partner have signed paperwork which will be entered into your electronic medical  record.  These signatures attest to the fact that that the information above on your After Visit Summary has been reviewed and is understood.  Full responsibility of the confidentiality of this discharge information lies with you and/or your care-partner.

## 2019-01-31 NOTE — Progress Notes (Signed)
Temps and vitals by CW

## 2019-02-04 ENCOUNTER — Telehealth: Payer: Self-pay

## 2019-02-04 NOTE — Telephone Encounter (Signed)
  Follow up Call-  Call back number 01/31/2019  Post procedure Call Back phone  # [phone]  Permission to leave phone message Yes  Some recent data might be hidden     Patient questions:  Do you have a fever, pain , or abdominal swelling? No. Pain Score  0 *  Have you tolerated food without any problems? Yes.    Have you been able to return to your normal activities? Yes.    Do you have any questions about your discharge instructions: Diet   No. Medications  No. Follow up visit  No.  Do you have questions or concerns about your Care? No.  Actions: * If pain score is 4 or above: No action needed, pain <4.  1. Have you developed a fever since your procedure? no  2.   Have you had an respiratory symptoms (SOB or cough) since your procedure? no  3.   Have you tested positive for COVID 19 since your procedure no  4.   Have you had any family members/close contacts diagnosed with the COVID 19 since your procedure?  no   If yes to any of these questions please route to Joylene Lexington, RN and Alphonsa Gin, Therapist, sports.

## 2019-02-05 ENCOUNTER — Other Ambulatory Visit: Payer: Self-pay | Admitting: *Deleted

## 2019-02-05 ENCOUNTER — Encounter: Payer: Self-pay | Admitting: Internal Medicine

## 2019-02-05 DIAGNOSIS — Z20822 Contact with and (suspected) exposure to covid-19: Secondary | ICD-10-CM

## 2019-02-05 DIAGNOSIS — R6889 Other general symptoms and signs: Secondary | ICD-10-CM | POA: Diagnosis not present

## 2019-02-05 NOTE — Progress Notes (Signed)
Letter to patient Re: Gastritis My chart

## 2019-02-12 LAB — NOVEL CORONAVIRUS, NAA: SARS-CoV-2, NAA: NOT DETECTED

## 2019-03-07 DIAGNOSIS — N183 Chronic kidney disease, stage 3 (moderate): Secondary | ICD-10-CM | POA: Diagnosis not present

## 2019-03-07 DIAGNOSIS — N189 Chronic kidney disease, unspecified: Secondary | ICD-10-CM | POA: Diagnosis not present

## 2019-03-10 DIAGNOSIS — N183 Chronic kidney disease, stage 3 (moderate): Secondary | ICD-10-CM | POA: Diagnosis not present

## 2019-03-13 DIAGNOSIS — E108 Type 1 diabetes mellitus with unspecified complications: Secondary | ICD-10-CM | POA: Diagnosis not present

## 2019-03-13 DIAGNOSIS — E109 Type 1 diabetes mellitus without complications: Secondary | ICD-10-CM | POA: Diagnosis not present

## 2019-03-13 DIAGNOSIS — Z794 Long term (current) use of insulin: Secondary | ICD-10-CM | POA: Diagnosis not present

## 2019-03-21 DIAGNOSIS — N184 Chronic kidney disease, stage 4 (severe): Secondary | ICD-10-CM | POA: Diagnosis not present

## 2019-03-21 DIAGNOSIS — D631 Anemia in chronic kidney disease: Secondary | ICD-10-CM | POA: Diagnosis not present

## 2019-03-21 DIAGNOSIS — I129 Hypertensive chronic kidney disease with stage 1 through stage 4 chronic kidney disease, or unspecified chronic kidney disease: Secondary | ICD-10-CM | POA: Diagnosis not present

## 2019-03-21 DIAGNOSIS — N2581 Secondary hyperparathyroidism of renal origin: Secondary | ICD-10-CM | POA: Diagnosis not present

## 2019-04-15 IMAGING — CT CT HEAD W/O CM
4 series · 16 of 47 positions shown, 18 images · non-contrast
Comparison: 01/12/2015

CLINICAL DATA: Abnormal sensation and blurred vision today.

EXAM:
CT HEAD WITHOUT CONTRAST
TECHNIQUE: Contiguous axial images were obtained from the base of the skull
through the vertex without intravenous contrast.

[Series 3: head wo · axial · 0.41mm/px · z∈[+1438,+1558]mm · 7 of 32 slices shown, 9 images]
[im 4/32  brain]
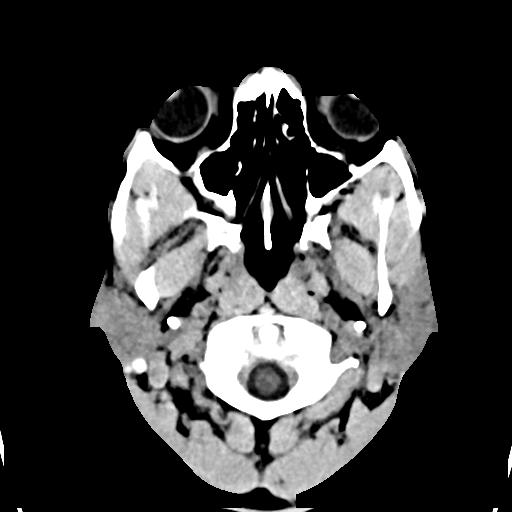
[im 4/32  bone]
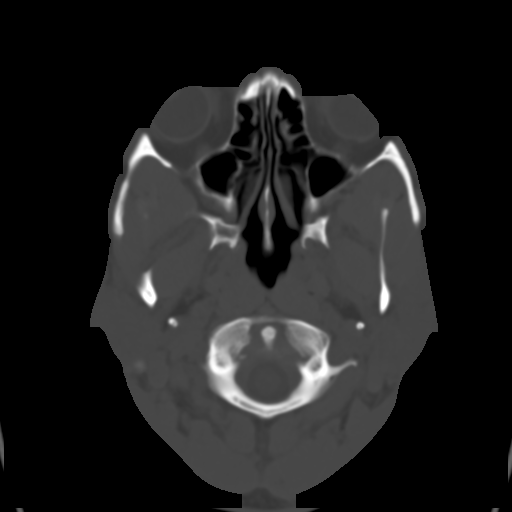
[im 8/32  brain]
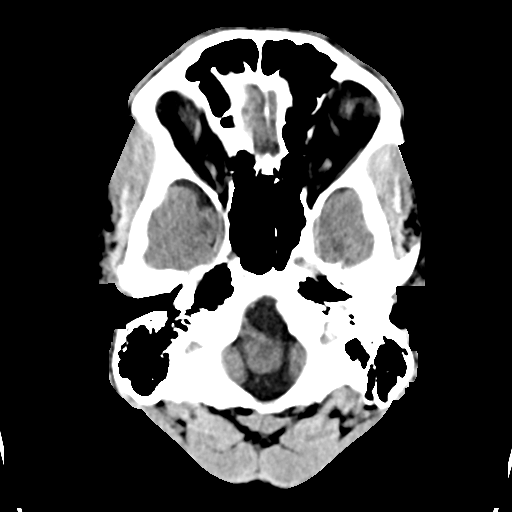
[im 12/32  brain]
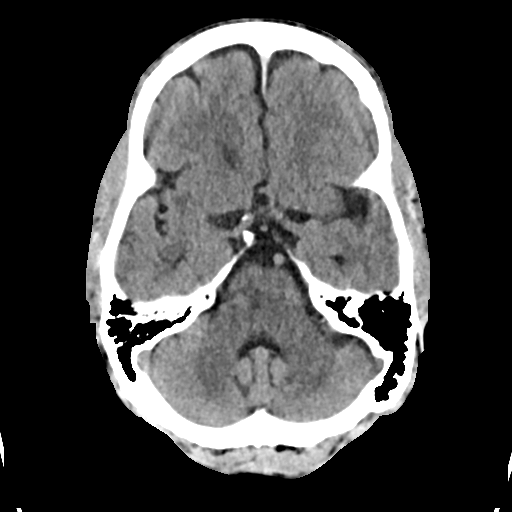
[im 16/32  brain]
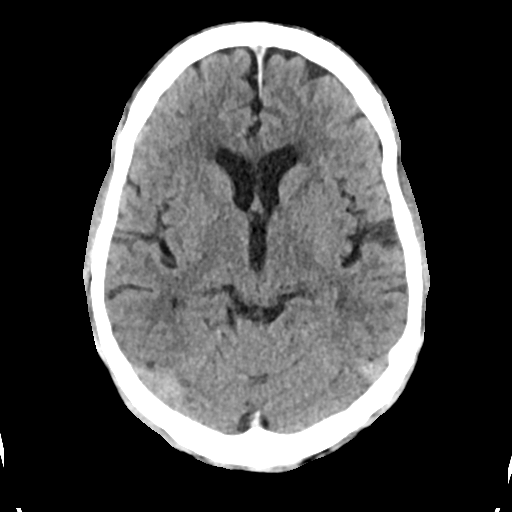
[im 20/32  brain]
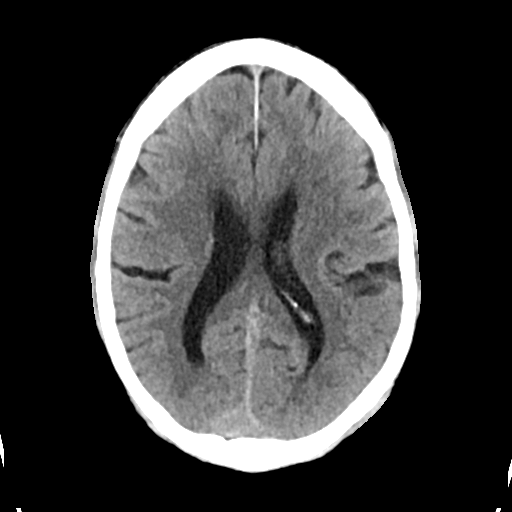
[im 20/32  bone]
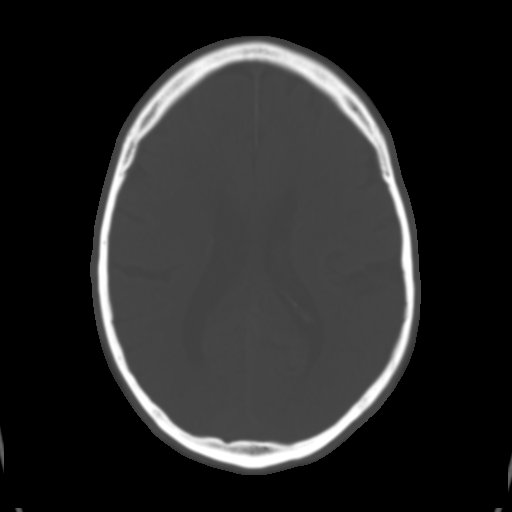
[im 24/32  brain]
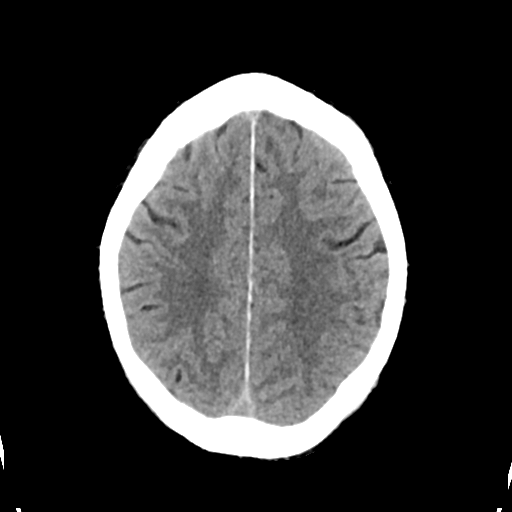
[im 28/32  brain]
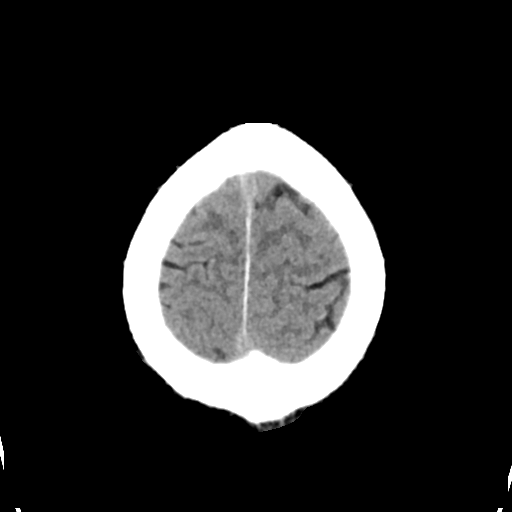

[Series 4: head bone · axial · 0.41mm/px · z∈[+1436,+1468]mm · 3 of 79 slices shown]
[im 8/79  bone]
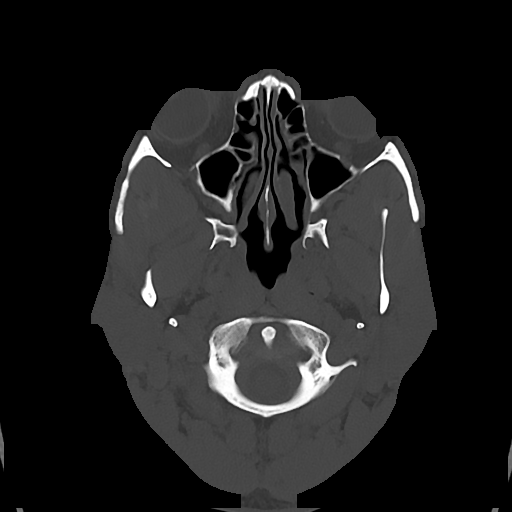
[im 16/79  bone]
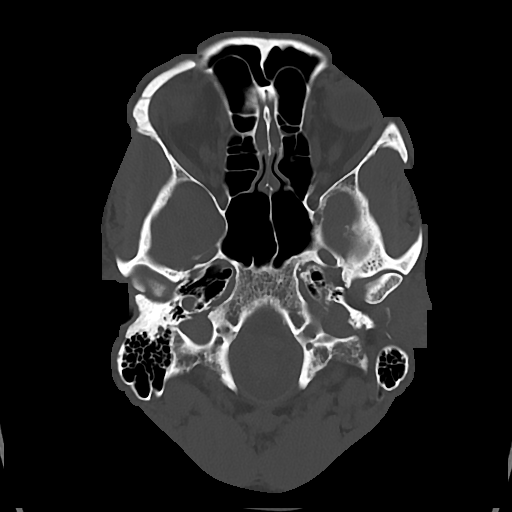
[im 24/79  bone]
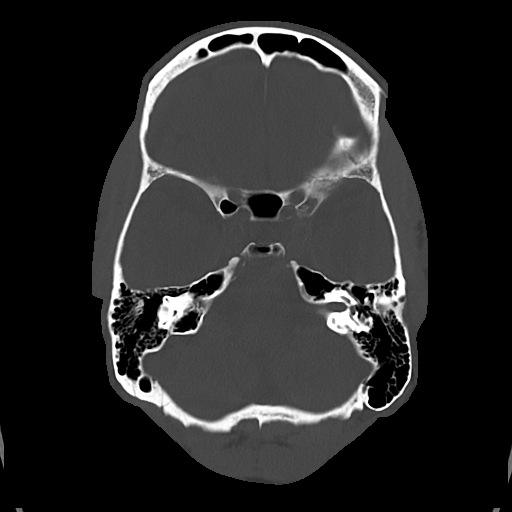

[Series 5: cor soft · coronal · 0.30mm/px · 3 of 69 slices shown]
[im 23/69  brain]
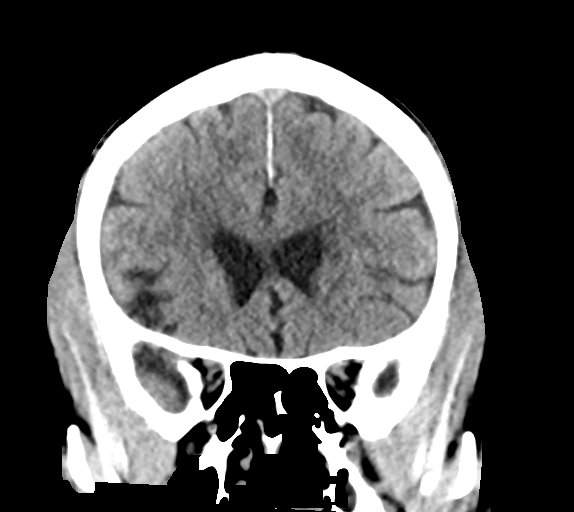
[im 31/69  brain]
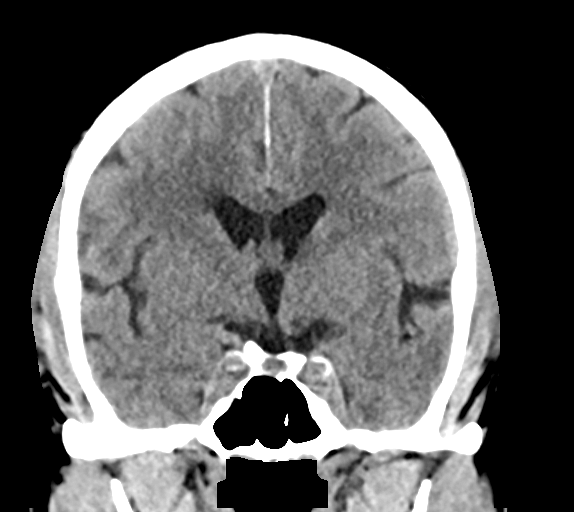
[im 38/69  brain]
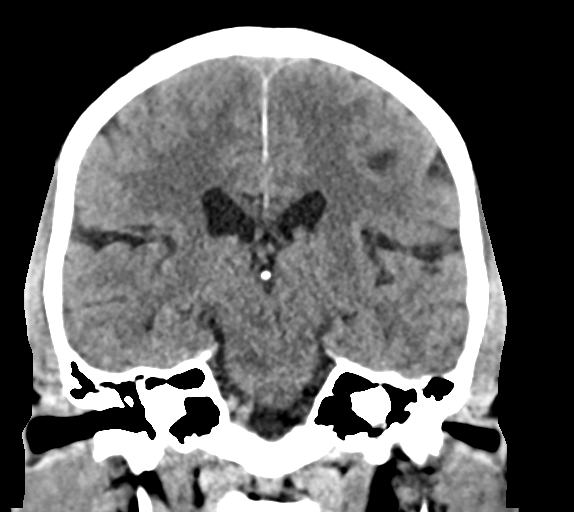

[Series 6: sag soft · sagittal · 0.32mm/px · 3 of 55 slices shown]
[im 19/55  brain]
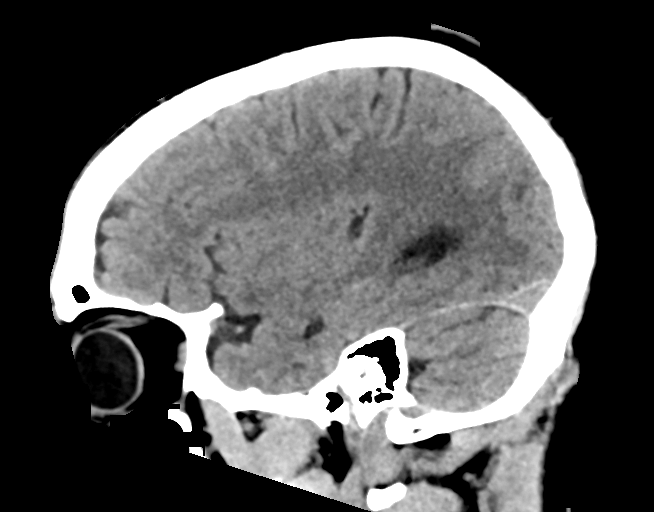
[im 28/55  brain]
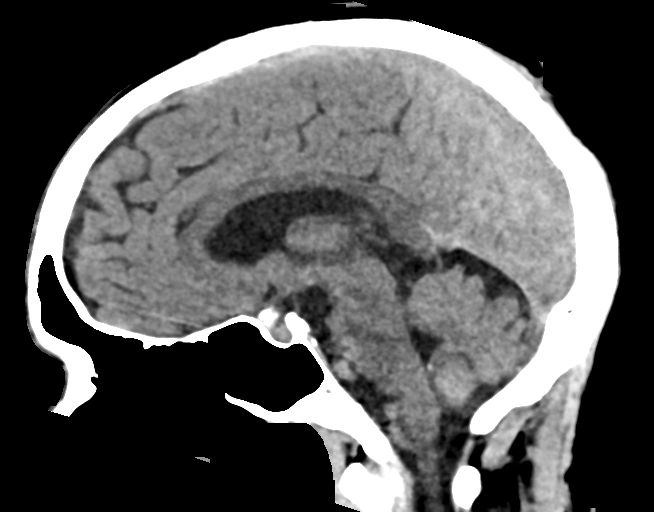
[im 37/55  brain]
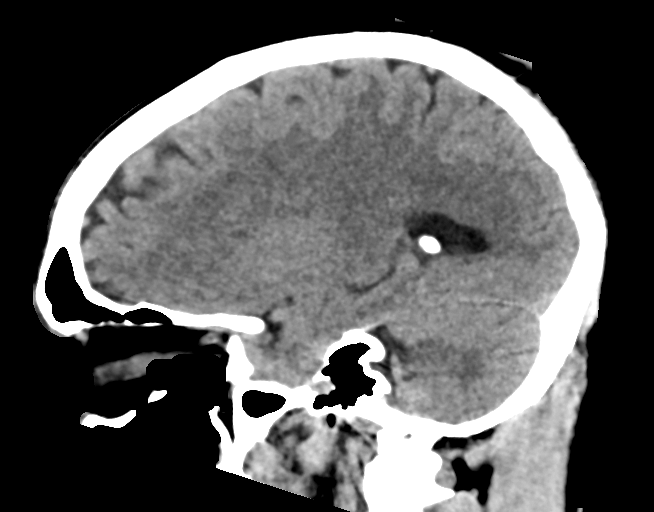

[16 of 47 positions shown; findings below may reference images not displayed]

FINDINGS: Brain: There is old infarction in the right side of the pons which
was acute in Tuesday January, 2015. No cerebellar abnormality is seen.
Cerebral hemispheres show mild chronic small-vessel ischemic changes
of the deep white matter. No cortical or large vessel territory
infarction. No mass lesion, hemorrhage, hydrocephalus or extra-axial
collection.

Vascular: There is atherosclerotic calcification of the major
vessels at the base of the brain.

Skull: Normal

Sinuses/Orbits: Clear/normal

Other: None
IMPRESSION: No acute finding by CT. Old right pontine infarction. Mild chronic
small-vessel ischemic changes of the cerebral hemispheric deep white
matter.

## 2019-04-30 DIAGNOSIS — N189 Chronic kidney disease, unspecified: Secondary | ICD-10-CM | POA: Diagnosis not present

## 2019-04-30 DIAGNOSIS — Z23 Encounter for immunization: Secondary | ICD-10-CM | POA: Diagnosis not present

## 2019-04-30 DIAGNOSIS — E109 Type 1 diabetes mellitus without complications: Secondary | ICD-10-CM | POA: Diagnosis not present

## 2019-04-30 DIAGNOSIS — I10 Essential (primary) hypertension: Secondary | ICD-10-CM | POA: Diagnosis not present

## 2019-04-30 DIAGNOSIS — Z9641 Presence of insulin pump (external) (internal): Secondary | ICD-10-CM | POA: Diagnosis not present

## 2019-04-30 DIAGNOSIS — E7800 Pure hypercholesterolemia, unspecified: Secondary | ICD-10-CM | POA: Diagnosis not present

## 2019-05-01 ENCOUNTER — Telehealth: Payer: Self-pay | Admitting: Internal Medicine

## 2019-05-01 NOTE — Telephone Encounter (Signed)
Pt stated that he had colonoscopy in June and was expecting medication for gastritis.

## 2019-05-02 NOTE — Telephone Encounter (Signed)
Left message on patients voicemail to call office.   

## 2019-05-09 NOTE — Telephone Encounter (Signed)
Patient is having decreased appetite and wants to come in to discuss the gastritis found on EGD. I told him Dr Carlean Purl noted on the pathology report that he didn't need treatment for this. He said his Dr he sees for his diabetes told him to call us and make sure he didn't need any rx's.

## 2019-05-27 ENCOUNTER — Ambulatory Visit (INDEPENDENT_AMBULATORY_CARE_PROVIDER_SITE_OTHER): Payer: BC Managed Care – PPO | Admitting: Internal Medicine

## 2019-05-27 ENCOUNTER — Encounter: Payer: Self-pay | Admitting: Internal Medicine

## 2019-05-27 ENCOUNTER — Other Ambulatory Visit: Payer: Self-pay

## 2019-05-27 VITALS — BP 122/64 | HR 70 | Temp 98.3°F | Ht 69.0 in | Wt 134.5 lb

## 2019-05-27 DIAGNOSIS — R634 Abnormal weight loss: Secondary | ICD-10-CM | POA: Diagnosis not present

## 2019-05-27 DIAGNOSIS — K2940 Chronic atrophic gastritis without bleeding: Secondary | ICD-10-CM

## 2019-05-27 DIAGNOSIS — R1013 Epigastric pain: Secondary | ICD-10-CM

## 2019-05-27 DIAGNOSIS — E1065 Type 1 diabetes mellitus with hyperglycemia: Secondary | ICD-10-CM

## 2019-05-27 MED ORDER — PANTOPRAZOLE SODIUM 20 MG PO TBEC: 20.0000 mg | DELAYED_RELEASE_TABLET | Freq: Every day | ORAL | 3 refills | Status: DC

## 2019-05-27 NOTE — Patient Instructions (Addendum)
Please start the pantoprazole - prescription sent to [facility].  Please work on eliminating the [employer] - it is really something important for your health.  See you in early December.   I appreciate the opportunity to care for you. Gatha Mayer, MD, [facility]

## 2019-05-27 NOTE — Assessment & Plan Note (Signed)
Given that he is on aspirin and Plavix and has this, he is at high risk of bleeding episodes so prophylaxis and treatment with PPI makes sense.  Pantoprazole 20 mg daily.

## 2019-05-27 NOTE — Assessment & Plan Note (Signed)
Seems mild overall but needs to be monitored.  Question from gastritis versus other causes.

## 2019-05-27 NOTE — Assessment & Plan Note (Signed)
Certainly related to some of his problems may be related to some of his weight loss.  I.e. hyperglycemia given hemoglobin A1c 8.1% though not terrible not in range.  Needs to stop drinking regular [facility] and I have advised him to reduce and eliminate this.  Unsweetened tea or coffee would be better.

## 2019-05-27 NOTE — Assessment & Plan Note (Signed)
Question related to gastritis or perhaps diabetes and neuropathy or both.  Treat with PPI and reassess

## 2019-05-27 NOTE — Progress Notes (Signed)
Daniel Santos. 58 y.o. [birth year] [id number]  Assessment & Plan:   Chronic erosive gastritis Given that he is on aspirin and Plavix and has this, he is at high risk of bleeding episodes so prophylaxis and treatment with PPI makes sense.  Pantoprazole 20 mg daily.  Type 1 diabetes mellitus, uncontrolled ([place]) Certainly related to some of his problems may be related to some of his weight loss.  I.e. hyperglycemia given hemoglobin A1c 8.1% though not terrible not in range.  Needs to stop drinking regular [facility] and I have advised him to reduce and eliminate this.  Unsweetened tea or coffee would be better.  Dyspepsia Question related to gastritis or perhaps diabetes and neuropathy or both.  Treat with PPI and reassess  Loss of weight Seems mild overall but needs to be monitored.  Question from gastritis versus other causes.   Cc Bindubal Daniel Santos, Gayland Curry, FNP   Subjective:   Chief Complaint: Gastritis abdominal pain  HPI Daniel Santos is here for follow-up, at the suggestion of Dr. Chalmers Cater because he described intermittent nausea dyspepsia and has lost about 6 pounds as reflected below.  He had an EGD and a colonoscopy, the colonoscopy was for screening EGD use because a history of mild hematemesis, this was done in June.  I found a moderate gastritis with some erosions, the EGD was done for hematemesis.  I was not aware that he was having dyspepsia symptoms or anything like that at the time and did not recommend any treatment.  However he had called back and scheduled this appointment because of the symptoms of dyspepsia nausea and indigestion mainly in the epigastrium.  His appetite comes and goes.  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   His schedule is off somewhat and he says he stays up playing Xbox a lot.  Dr. Soyla Murphy is concerned about him losing weight and does not want him to lose anymore weight.  Diet reviewed notable for 512 ounce [facility] each day.   Last hemoglobin A1c I have available is 8.1% in May 2020 Wt Readings from Last 3 Encounters:  05/27/19 134 lb 8 oz (61 kg)  01/31/19 134 lb (60.8 kg)  01/17/19 140 lb (63.5 kg)   Cooks at home w/ lady friend 5 Mt Dew/day Allergies  Allergen Reactions   Doxycycline Nausea Only and Other (See Comments)    GI upset, kidney function   Current Meds  Medication Sig   amLODipine (NORVASC) 10 MG tablet Take 10 mg by mouth at bedtime.    aspirin EC 325 MG EC tablet Take 1 tablet (325 mg total) by mouth daily.   atorvastatin (LIPITOR) 40 MG tablet Take 1 tablet (40 mg total) every evening by mouth.   BUTRANS 20 MCG/HR PTWK patch Place 20 mcg onto the skin every Monday.    calcitRIOL (ROCALTROL) 0.25 MCG capsule Take 0.25 mcg by mouth daily.    clopidogrel (PLAVIX) 75 MG tablet Take 1 tablet (75 mg total) by mouth daily.   CONTOUR NEXT TEST test strip    ferrous sulfate 325 (65 FE) MG tablet Take 325 mg by mouth at bedtime.    Furosemide (LASIX PO) Take 160 mg by mouth 2 (two) times a day.  insulin lispro (HUMALOG) 100 UNIT/ML injection See admin instructions. Uses via insulin pump per bolus rate   LINZESS 145 MCG CAPS capsule Take 145 mcg by mouth daily as needed (for constipation).    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) 03/25/2015   History of non-insulin dependent diabetes mellitus      Hypercholesteremia    Hypertension    Left arm weakness    Palpitations 12/14/2015   Renal disorder    Stroke [facility])    Past Surgical History:  Procedure Laterality Date   AV FISTULA PLACEMENT     BASCILIC VEIN TRANSPOSITION Left 10/26/2017   Procedure: BASILIC VEIN TRANSPOSITION FIRST STAGE LEFT;  Surgeon: Larina Earthly, MD;  Location: [facility] OR;  Service: Vascular;  Laterality: Left;   BASCILIC VEIN TRANSPOSITION Left 01/18/2018   Procedure: SECOND STAGEBASILIC VEIN TRANSPOSITION LEFT ARM;  Surgeon: Larina Earthly, MD;  Location: [facility] OR;  Service: Vascular;  Laterality: Left;   COLONOSCOPY     DIRECT LARYNGOSCOPY N/A 09/24/2018   Procedure: DIRECT LARYNGOSCOPY WITH BIOPSY,BRONCHOSOPY;  Surgeon: Graylin Shiver, MD;  Location: [facility] OR;  Service: ENT;  Laterality: N/A;   DRAINAGE AND CLOSURE OF LYMPHOCELE Left 11/21/2017   Procedure: DRAINAGE AND CLOSURE OF LYMPHOCELE OF LEFT UPPER ARM;  Surgeon: Sherren Kerns, MD;  Location: [facility] OR;  Service: Vascular;  Laterality: Left;   LUMBAR SPINE SURGERY  2005, 2008, 2010   REPAIR OF FRACTURED PENIS  05/2000   with cystoscopy   Social History   Social History Narrative   Patient drinks about 2 cups of caffeine daily.   Patient is right handed.   family history includes Breast cancer in his maternal grandmother and paternal grandmother; Cancer in his mother; Diabetes in his paternal aunt and paternal grandmother; Kidney disease in his father; Leukemia in his paternal uncle; Stroke in his paternal grandmother.   Review of Systems As above  Objective:   Physical Exam BP 122/64    Pulse 70    Temp 98.3 F (36.8 C)    Ht 5\' 9"  (1.753 m)    Wt 134 lb 8 oz (61 kg)    SpO2 97%    BMI 19.86 kg/m  Thin no acute distress Eyes anicteric Abdomen is soft nontender without organomegaly or mass Alert noted x3 Left AV fistula

## 2019-06-03 DIAGNOSIS — D631 Anemia in chronic kidney disease: Secondary | ICD-10-CM | POA: Diagnosis not present

## 2019-06-03 DIAGNOSIS — N189 Chronic kidney disease, unspecified: Secondary | ICD-10-CM | POA: Diagnosis not present

## 2019-06-03 DIAGNOSIS — N184 Chronic kidney disease, stage 4 (severe): Secondary | ICD-10-CM | POA: Diagnosis not present

## 2019-06-03 DIAGNOSIS — Z1159 Encounter for screening for other viral diseases: Secondary | ICD-10-CM | POA: Diagnosis not present

## 2019-06-03 DIAGNOSIS — N2581 Secondary hyperparathyroidism of renal origin: Secondary | ICD-10-CM | POA: Diagnosis not present

## 2019-06-03 DIAGNOSIS — I129 Hypertensive chronic kidney disease with stage 1 through stage 4 chronic kidney disease, or unspecified chronic kidney disease: Secondary | ICD-10-CM | POA: Diagnosis not present

## 2019-06-14 DIAGNOSIS — E109 Type 1 diabetes mellitus without complications: Secondary | ICD-10-CM | POA: Diagnosis not present

## 2019-06-14 DIAGNOSIS — E108 Type 1 diabetes mellitus with unspecified complications: Secondary | ICD-10-CM | POA: Diagnosis not present

## 2019-06-14 DIAGNOSIS — Z794 Long term (current) use of insulin: Secondary | ICD-10-CM | POA: Diagnosis not present

## 2019-07-08 ENCOUNTER — Ambulatory Visit (INDEPENDENT_AMBULATORY_CARE_PROVIDER_SITE_OTHER): Payer: BC Managed Care – PPO | Admitting: Internal Medicine

## 2019-07-08 ENCOUNTER — Encounter: Payer: Self-pay | Admitting: Internal Medicine

## 2019-07-08 ENCOUNTER — Other Ambulatory Visit: Payer: Self-pay

## 2019-07-08 VITALS — BP 130/64 | HR 80 | Temp 97.6°F | Ht 69.0 in | Wt 132.6 lb

## 2019-07-08 DIAGNOSIS — R634 Abnormal weight loss: Secondary | ICD-10-CM | POA: Diagnosis not present

## 2019-07-08 DIAGNOSIS — R1013 Epigastric pain: Secondary | ICD-10-CM | POA: Diagnosis not present

## 2019-07-08 DIAGNOSIS — R319 Hematuria, unspecified: Secondary | ICD-10-CM | POA: Diagnosis not present

## 2019-07-08 DIAGNOSIS — K2940 Chronic atrophic gastritis without bleeding: Secondary | ICD-10-CM | POA: Diagnosis not present

## 2019-07-08 DIAGNOSIS — M25421 Effusion, right elbow: Secondary | ICD-10-CM

## 2019-07-08 MED ORDER — PANTOPRAZOLE SODIUM 20 MG PO TBEC: 20.0000 mg | DELAYED_RELEASE_TABLET | Freq: Every day | ORAL | 3 refills | Status: DC

## 2019-07-08 NOTE — Assessment & Plan Note (Addendum)
Better on PPI, will continue pantoprazole for this and his chronic Plavix and aspirin use

## 2019-07-08 NOTE — Assessment & Plan Note (Signed)
PPI ?

## 2019-07-08 NOTE — Assessment & Plan Note (Signed)
Question if he has some sort of synovial cyst or effusion.  I have asked him to contact his orthopedic practice for evaluation and treatment.

## 2019-07-08 NOTE — Assessment & Plan Note (Signed)
I have asked him to notify Dr. Marval Regal, his nephrologist.

## 2019-07-08 NOTE — Assessment & Plan Note (Addendum)
I wonder how much of this is not due to his hyperglycemia, he is down a couple of pounds but I do not think he needs additional work-up.

## 2019-07-08 NOTE — Progress Notes (Signed)
Daniel Santos. 58 y.o. [birth year] [id number]  Assessment & Plan:  Dyspepsia Better on PPI, will continue pantoprazole for this and his chronic Plavix and aspirin use  Chronic erosive gastritis PPI  Loss of weight I wonder how much of this is not due to his hyperglycemia, he is down a couple of pounds but I do not think he needs additional work-up.  Elbow swelling, right Question if he has some sort of synovial cyst or effusion.  I have asked him to contact his orthopedic practice for evaluation and treatment.  Hematuria of unknown cause I have asked him to notify Dr. Marval Regal, his nephrologist.    He can return to see me as needed.  I appreciate the opportunity to care for this patient. CC: Suella Broad, FNP Dr. Haynes Hoehn  Dr. Donato Heinz Dr. Susa Day Subjective:   Chief Complaint: Follow-up of dyspepsia  HPI The patient had an upper GI endoscopy on January 31, 2019 that showed gastritis he had hematemesis in the setting of that and Plavix use.  Biopsies showed chronic gastritis no H. pylori at the time.  Screening colonoscopy on that date was negative.  I then saw him in October when he was seen because of intermittent nausea and dyspepsia and some weight loss see that note of October 20 for further details.  He had an altered schedule because he has not been back at work, he was drinking 512 ounce [employer] each day and his hemoglobin A1c was 8.1% in May.  I put him on pantoprazole 20 mg daily.   He takes Plavix and aspirin daily so I thought at least prophylaxis with pantoprazole made sense for that and wanted to see if it would help his dyspepsia.  He is here for follow-up.   Wt Readings from Last 3 Encounters:  07/08/19 132 lb 9.6 oz (60.1 kg)  05/27/19 134 lb 8 oz (61 kg)  01/31/19 134 lb (60.8 kg)   He has also noted swelling in his right elbow and indicates that within the past week he had grossly bloody urine or at least red  urine without other symptoms Allergies  Allergen Reactions  . Doxycycline Nausea Only and Other (See Comments)    GI upset, kidney function   Current Meds  Medication Sig  . amLODipine (NORVASC) 10 MG tablet Take 10 mg by mouth at bedtime.   Marland Kitchen aspirin EC 325 MG EC tablet Take 1 tablet (325 mg total) by mouth daily.  Marland Kitchen atorvastatin (LIPITOR) 40 MG tablet Take 1 tablet (40 mg total) every evening by mouth.  Haze Rushing 20 MCG/HR PTWK patch Place 20 mcg onto the skin every Monday.   . calcitRIOL (ROCALTROL) 0.25 MCG capsule Take 0.25 mcg by mouth daily.   . clopidogrel (PLAVIX) 75 MG tablet Take 1 tablet (75 mg total) by mouth daily.  . CONTOUR NEXT TEST test strip   . ferrous sulfate 325 (65 FE) MG tablet Take 325 mg by mouth at bedtime.   . Furosemide (LASIX PO) Take 160 mg by mouth 2 (two) times a day.   . insulin lispro (HUMALOG) 100 UNIT/ML injection See admin instructions. Uses via insulin pump per bolus rate  . LINZESS 145 MCG CAPS capsule Take 145 mcg by mouth daily as needed (for constipation).   . pantoprazole (PROTONIX) 20 MG tablet Take 1 tablet (20 mg total) by mouth daily before breakfast.  . [DISCONTINUED] pantoprazole (PROTONIX) 20 MG tablet Take 1 tablet (20 mg total)  by mouth daily before breakfast.   Past Medical History:  Diagnosis Date  . Anemia   . Atypical chest pain 12/14/2015  . CKD (chronic kidney disease), stage IV (HCC) 03/23/2017  . Clotting disorder (HCC)   . Diabetes mellitus without complication (HCC)    Type I  . Gastritis   . 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 History of non-insulin dependent diabetes mellitus   . Hypercholesteremia   . Hypertension   . Left arm weakness   . Palpitations 12/14/2015  . Stroke [facility])    Past Surgical History:  Procedure Laterality Date  . AV FISTULA PLACEMENT    . BASCILIC VEIN TRANSPOSITION Left 10/26/2017   Procedure: BASILIC VEIN TRANSPOSITION FIRST STAGE LEFT;   Surgeon: Larina Earthly, MD;  Location: [facility] OR;  Service: Vascular;  Laterality: Left;  . BASCILIC VEIN TRANSPOSITION Left 01/18/2018   Procedure: SECOND STAGEBASILIC VEIN TRANSPOSITION LEFT ARM;  Surgeon: Larina Earthly, MD;  Location: [facility] OR;  Service: Vascular;  Laterality: Left;  . COLONOSCOPY    . DIRECT LARYNGOSCOPY N/A 09/24/2018   Procedure: DIRECT LARYNGOSCOPY WITH BIOPSY,BRONCHOSOPY;  Surgeon: Graylin Shiver, MD;  Location: [facility] OR;  Service: ENT;  Laterality: N/A;  . DRAINAGE AND CLOSURE OF LYMPHOCELE Left 11/21/2017   Procedure: DRAINAGE AND CLOSURE OF LYMPHOCELE OF LEFT UPPER ARM;  Surgeon: Sherren Kerns, MD;  Location: [facility] OR;  Service: Vascular;  Laterality: Left;  . ESOPHAGOGASTRODUODENOSCOPY    . LUMBAR SPINE SURGERY  2005, 2008, 2010  . REPAIR OF FRACTURED PENIS  05/2000   with cystoscopy   Social History   Social History Narrative   Patient drinks about 2 cups of caffeine daily.   Patient is right handed.   family history includes Breast cancer in his maternal grandmother and paternal grandmother; Cancer in his mother; Diabetes in his paternal aunt and paternal grandmother; Kidney disease in his father; Leukemia in his paternal uncle; Stroke in his paternal grandmother.   Review of Systems As above  Objective:   Physical Exam BP 130/64   Pulse 80   Temp 97.6 F (36.4 C)   Ht 5\' 9"  (1.753 m)   Wt 132 lb 9.6 oz (60.1 kg)   BMI 19.58 kg/m  Thin black man in no acute distress looking well On the right elbow there is a fluid collection, soft nontender about the size of a ping-pong ball may be slightly greater normal range of motion no warmth or erythema

## 2019-07-08 NOTE — Patient Instructions (Addendum)
We have sent the following prescriptions to your mail in pharmacy:  Pantoprazole  If you have not heard from your mail in pharmacy within 1 week or if you have not received your medication in the mail, please contact us at [phone] so we may find out why.    Please contact Dr Marval Regal about the blood in your urine.   Please contact Dr Tonita Cong about your elbow.   Follow up with Dr Carlean Purl as needed.   I appreciate the opportunity to care for you. Silvano Rusk, MD, [facility]

## 2019-07-10 DIAGNOSIS — M25521 Pain in right elbow: Secondary | ICD-10-CM | POA: Diagnosis not present

## 2019-07-10 DIAGNOSIS — M7021 Olecranon bursitis, right elbow: Secondary | ICD-10-CM | POA: Diagnosis not present

## 2019-07-24 DIAGNOSIS — I129 Hypertensive chronic kidney disease with stage 1 through stage 4 chronic kidney disease, or unspecified chronic kidney disease: Secondary | ICD-10-CM | POA: Diagnosis not present

## 2019-07-24 DIAGNOSIS — N184 Chronic kidney disease, stage 4 (severe): Secondary | ICD-10-CM | POA: Diagnosis not present

## 2019-07-24 DIAGNOSIS — D631 Anemia in chronic kidney disease: Secondary | ICD-10-CM | POA: Diagnosis not present

## 2019-07-24 DIAGNOSIS — N189 Chronic kidney disease, unspecified: Secondary | ICD-10-CM | POA: Diagnosis not present

## 2019-07-24 DIAGNOSIS — N2581 Secondary hyperparathyroidism of renal origin: Secondary | ICD-10-CM | POA: Diagnosis not present

## 2019-09-01 DIAGNOSIS — E109 Type 1 diabetes mellitus without complications: Secondary | ICD-10-CM | POA: Diagnosis not present

## 2019-09-01 DIAGNOSIS — N189 Chronic kidney disease, unspecified: Secondary | ICD-10-CM | POA: Diagnosis not present

## 2019-09-01 DIAGNOSIS — I10 Essential (primary) hypertension: Secondary | ICD-10-CM | POA: Diagnosis not present

## 2019-09-01 DIAGNOSIS — E7800 Pure hypercholesterolemia, unspecified: Secondary | ICD-10-CM | POA: Diagnosis not present

## 2019-09-05 DIAGNOSIS — N184 Chronic kidney disease, stage 4 (severe): Secondary | ICD-10-CM | POA: Diagnosis not present

## 2019-09-05 DIAGNOSIS — E1022 Type 1 diabetes mellitus with diabetic chronic kidney disease: Secondary | ICD-10-CM | POA: Diagnosis not present

## 2019-09-05 DIAGNOSIS — Z111 Encounter for screening for respiratory tuberculosis: Secondary | ICD-10-CM | POA: Diagnosis not present

## 2019-09-05 DIAGNOSIS — Z125 Encounter for screening for malignant neoplasm of prostate: Secondary | ICD-10-CM | POA: Diagnosis not present

## 2019-09-05 DIAGNOSIS — Z01818 Encounter for other preprocedural examination: Secondary | ICD-10-CM | POA: Diagnosis not present

## 2019-09-05 DIAGNOSIS — Z113 Encounter for screening for infections with a predominantly sexual mode of transmission: Secondary | ICD-10-CM | POA: Diagnosis not present

## 2019-09-05 DIAGNOSIS — E7800 Pure hypercholesterolemia, unspecified: Secondary | ICD-10-CM | POA: Diagnosis not present

## 2019-09-08 DIAGNOSIS — Z01818 Encounter for other preprocedural examination: Secondary | ICD-10-CM | POA: Diagnosis not present

## 2019-09-25 ENCOUNTER — Ambulatory Visit: Payer: BC Managed Care – PPO

## 2019-09-29 ENCOUNTER — Ambulatory Visit: Payer: Self-pay | Attending: Family

## 2019-09-29 DIAGNOSIS — Z23 Encounter for immunization: Secondary | ICD-10-CM | POA: Insufficient documentation

## 2019-09-29 NOTE — Progress Notes (Signed)
   [id number]  Name:  Daniel Santos.    MRN: [id number] DOB: 24-Nov-1960  09/29/2019  [name] was observed post Covid-19 immunization for 15 minutes without incidence. He was provided with Vaccine Information Sheet and instruction to access the V-Safe system.   [name] was instructed to call 911 with any severe reactions post vaccine: Marland Kitchen Difficulty breathing  . Swelling of your face and throat  . A fast heartbeat  . A bad rash all over your body  . Dizziness and weakness    Immunizations Administered    Name Date Dose VIS Date Route   Moderna COVID-19 Vaccine 09/29/2019  9:54 AM 0.5 mL 07/08/2019 Intramuscular   Manufacturer: Moderna   Lot: [id number]   NDC: 37944-461-90

## 2019-10-01 DIAGNOSIS — I129 Hypertensive chronic kidney disease with stage 1 through stage 4 chronic kidney disease, or unspecified chronic kidney disease: Secondary | ICD-10-CM | POA: Diagnosis not present

## 2019-10-01 DIAGNOSIS — N184 Chronic kidney disease, stage 4 (severe): Secondary | ICD-10-CM | POA: Diagnosis not present

## 2019-10-01 DIAGNOSIS — N2581 Secondary hyperparathyroidism of renal origin: Secondary | ICD-10-CM | POA: Diagnosis not present

## 2019-10-01 DIAGNOSIS — D631 Anemia in chronic kidney disease: Secondary | ICD-10-CM | POA: Diagnosis not present

## 2019-10-01 DIAGNOSIS — N189 Chronic kidney disease, unspecified: Secondary | ICD-10-CM | POA: Diagnosis not present

## 2019-10-06 DIAGNOSIS — Z01818 Encounter for other preprocedural examination: Secondary | ICD-10-CM | POA: Diagnosis not present

## 2019-10-06 DIAGNOSIS — E1022 Type 1 diabetes mellitus with diabetic chronic kidney disease: Secondary | ICD-10-CM | POA: Diagnosis not present

## 2019-10-06 DIAGNOSIS — Z87891 Personal history of nicotine dependence: Secondary | ICD-10-CM | POA: Diagnosis not present

## 2019-10-06 DIAGNOSIS — Z79899 Other long term (current) drug therapy: Secondary | ICD-10-CM | POA: Diagnosis not present

## 2019-10-06 DIAGNOSIS — I120 Hypertensive chronic kidney disease with stage 5 chronic kidney disease or end stage renal disease: Secondary | ICD-10-CM | POA: Diagnosis not present

## 2019-10-06 DIAGNOSIS — Z794 Long term (current) use of insulin: Secondary | ICD-10-CM | POA: Diagnosis not present

## 2019-10-06 DIAGNOSIS — N184 Chronic kidney disease, stage 4 (severe): Secondary | ICD-10-CM | POA: Diagnosis not present

## 2019-10-06 DIAGNOSIS — E1021 Type 1 diabetes mellitus with diabetic nephropathy: Secondary | ICD-10-CM | POA: Diagnosis not present

## 2019-10-06 DIAGNOSIS — I129 Hypertensive chronic kidney disease with stage 1 through stage 4 chronic kidney disease, or unspecified chronic kidney disease: Secondary | ICD-10-CM | POA: Diagnosis not present

## 2019-10-06 DIAGNOSIS — Z8673 Personal history of transient ischemic attack (TIA), and cerebral infarction without residual deficits: Secondary | ICD-10-CM | POA: Diagnosis not present

## 2019-10-06 DIAGNOSIS — Z7902 Long term (current) use of antithrombotics/antiplatelets: Secondary | ICD-10-CM | POA: Diagnosis not present

## 2019-10-28 ENCOUNTER — Ambulatory Visit: Payer: Self-pay | Attending: Family

## 2019-10-28 DIAGNOSIS — Z23 Encounter for immunization: Secondary | ICD-10-CM

## 2019-10-28 NOTE — Progress Notes (Signed)
   [id number]  Name:  Daniel Santos.    MRN: [id number] DOB: 1961/06/09  10/28/2019  Mr. [name] was observed post Covid-19 immunization for 15 minutes without incident. He was provided with Vaccine Information Sheet and instruction to access the V-Safe system.   Mr. [name] was instructed to call 911 with any severe reactions post vaccine: Marland Kitchen Difficulty breathing  . Swelling of face and throat  . A fast heartbeat  . A bad rash all over body  . Dizziness and weakness   Immunizations Administered    Name Date Dose VIS Date Route   Moderna COVID-19 Vaccine 10/28/2019 10:44 AM 0.5 mL 07/08/2019 Intramuscular   Manufacturer: Moderna   Lot: [id number]   NDC: 85992-341-44

## 2019-11-11 DIAGNOSIS — N184 Chronic kidney disease, stage 4 (severe): Secondary | ICD-10-CM | POA: Diagnosis not present

## 2019-11-14 DIAGNOSIS — N184 Chronic kidney disease, stage 4 (severe): Secondary | ICD-10-CM | POA: Diagnosis not present

## 2019-11-14 DIAGNOSIS — Z794 Long term (current) use of insulin: Secondary | ICD-10-CM | POA: Diagnosis not present

## 2019-11-14 DIAGNOSIS — I361 Nonrheumatic tricuspid (valve) insufficiency: Secondary | ICD-10-CM | POA: Diagnosis not present

## 2019-11-14 DIAGNOSIS — E1022 Type 1 diabetes mellitus with diabetic chronic kidney disease: Secondary | ICD-10-CM | POA: Diagnosis not present

## 2019-11-14 DIAGNOSIS — Z7682 Awaiting organ transplant status: Secondary | ICD-10-CM | POA: Diagnosis not present

## 2019-11-14 DIAGNOSIS — Z7982 Long term (current) use of aspirin: Secondary | ICD-10-CM | POA: Diagnosis not present

## 2019-11-14 DIAGNOSIS — Z01810 Encounter for preprocedural cardiovascular examination: Secondary | ICD-10-CM | POA: Diagnosis not present

## 2019-11-14 DIAGNOSIS — I517 Cardiomegaly: Secondary | ICD-10-CM | POA: Diagnosis not present

## 2019-11-14 DIAGNOSIS — I129 Hypertensive chronic kidney disease with stage 1 through stage 4 chronic kidney disease, or unspecified chronic kidney disease: Secondary | ICD-10-CM | POA: Diagnosis not present

## 2019-11-14 DIAGNOSIS — Z7902 Long term (current) use of antithrombotics/antiplatelets: Secondary | ICD-10-CM | POA: Diagnosis not present

## 2019-11-14 DIAGNOSIS — I272 Pulmonary hypertension, unspecified: Secondary | ICD-10-CM | POA: Diagnosis not present

## 2019-11-14 DIAGNOSIS — I081 Rheumatic disorders of both mitral and tricuspid valves: Secondary | ICD-10-CM | POA: Diagnosis not present

## 2019-11-25 DIAGNOSIS — E109 Type 1 diabetes mellitus without complications: Secondary | ICD-10-CM | POA: Diagnosis not present

## 2019-11-25 DIAGNOSIS — Z794 Long term (current) use of insulin: Secondary | ICD-10-CM | POA: Diagnosis not present

## 2019-11-25 DIAGNOSIS — E108 Type 1 diabetes mellitus with unspecified complications: Secondary | ICD-10-CM | POA: Diagnosis not present

## 2019-11-26 DIAGNOSIS — I129 Hypertensive chronic kidney disease with stage 1 through stage 4 chronic kidney disease, or unspecified chronic kidney disease: Secondary | ICD-10-CM | POA: Diagnosis not present

## 2019-11-26 DIAGNOSIS — N184 Chronic kidney disease, stage 4 (severe): Secondary | ICD-10-CM | POA: Diagnosis not present

## 2019-11-26 DIAGNOSIS — E1122 Type 2 diabetes mellitus with diabetic chronic kidney disease: Secondary | ICD-10-CM | POA: Diagnosis not present

## 2019-11-26 DIAGNOSIS — I272 Pulmonary hypertension, unspecified: Secondary | ICD-10-CM | POA: Diagnosis not present

## 2019-12-02 DIAGNOSIS — D631 Anemia in chronic kidney disease: Secondary | ICD-10-CM | POA: Diagnosis not present

## 2019-12-02 DIAGNOSIS — N184 Chronic kidney disease, stage 4 (severe): Secondary | ICD-10-CM | POA: Diagnosis not present

## 2019-12-02 DIAGNOSIS — I129 Hypertensive chronic kidney disease with stage 1 through stage 4 chronic kidney disease, or unspecified chronic kidney disease: Secondary | ICD-10-CM | POA: Diagnosis not present

## 2019-12-02 DIAGNOSIS — N2581 Secondary hyperparathyroidism of renal origin: Secondary | ICD-10-CM | POA: Diagnosis not present

## 2019-12-03 DIAGNOSIS — E875 Hyperkalemia: Secondary | ICD-10-CM | POA: Diagnosis not present

## 2019-12-18 DIAGNOSIS — Z794 Long term (current) use of insulin: Secondary | ICD-10-CM | POA: Diagnosis not present

## 2019-12-18 DIAGNOSIS — I6529 Occlusion and stenosis of unspecified carotid artery: Secondary | ICD-10-CM | POA: Diagnosis not present

## 2019-12-18 DIAGNOSIS — N184 Chronic kidney disease, stage 4 (severe): Secondary | ICD-10-CM | POA: Diagnosis not present

## 2019-12-18 DIAGNOSIS — I129 Hypertensive chronic kidney disease with stage 1 through stage 4 chronic kidney disease, or unspecified chronic kidney disease: Secondary | ICD-10-CM | POA: Diagnosis not present

## 2019-12-18 DIAGNOSIS — Z8673 Personal history of transient ischemic attack (TIA), and cerebral infarction without residual deficits: Secondary | ICD-10-CM | POA: Diagnosis not present

## 2019-12-18 DIAGNOSIS — E1022 Type 1 diabetes mellitus with diabetic chronic kidney disease: Secondary | ICD-10-CM | POA: Diagnosis not present

## 2019-12-18 DIAGNOSIS — Z87891 Personal history of nicotine dependence: Secondary | ICD-10-CM | POA: Diagnosis not present

## 2019-12-18 DIAGNOSIS — I272 Pulmonary hypertension, unspecified: Secondary | ICD-10-CM | POA: Diagnosis not present

## 2019-12-30 DIAGNOSIS — E109 Type 1 diabetes mellitus without complications: Secondary | ICD-10-CM | POA: Diagnosis not present

## 2019-12-30 DIAGNOSIS — Z794 Long term (current) use of insulin: Secondary | ICD-10-CM | POA: Diagnosis not present

## 2019-12-30 DIAGNOSIS — E108 Type 1 diabetes mellitus with unspecified complications: Secondary | ICD-10-CM | POA: Diagnosis not present

## 2020-01-08 DIAGNOSIS — N184 Chronic kidney disease, stage 4 (severe): Secondary | ICD-10-CM | POA: Diagnosis not present

## 2020-02-10 DIAGNOSIS — N184 Chronic kidney disease, stage 4 (severe): Secondary | ICD-10-CM | POA: Diagnosis not present

## 2020-02-19 DIAGNOSIS — I129 Hypertensive chronic kidney disease with stage 1 through stage 4 chronic kidney disease, or unspecified chronic kidney disease: Secondary | ICD-10-CM | POA: Diagnosis not present

## 2020-02-19 DIAGNOSIS — D631 Anemia in chronic kidney disease: Secondary | ICD-10-CM | POA: Diagnosis not present

## 2020-02-19 DIAGNOSIS — N184 Chronic kidney disease, stage 4 (severe): Secondary | ICD-10-CM | POA: Diagnosis not present

## 2020-02-19 DIAGNOSIS — N2581 Secondary hyperparathyroidism of renal origin: Secondary | ICD-10-CM | POA: Diagnosis not present

## 2020-03-01 DIAGNOSIS — Z9641 Presence of insulin pump (external) (internal): Secondary | ICD-10-CM | POA: Diagnosis not present

## 2020-03-01 DIAGNOSIS — I10 Essential (primary) hypertension: Secondary | ICD-10-CM | POA: Diagnosis not present

## 2020-03-01 DIAGNOSIS — E109 Type 1 diabetes mellitus without complications: Secondary | ICD-10-CM | POA: Diagnosis not present

## 2020-03-01 DIAGNOSIS — N189 Chronic kidney disease, unspecified: Secondary | ICD-10-CM | POA: Diagnosis not present

## 2020-03-03 DIAGNOSIS — I129 Hypertensive chronic kidney disease with stage 1 through stage 4 chronic kidney disease, or unspecified chronic kidney disease: Secondary | ICD-10-CM | POA: Diagnosis not present

## 2020-03-03 DIAGNOSIS — E109 Type 1 diabetes mellitus without complications: Secondary | ICD-10-CM | POA: Diagnosis not present

## 2020-03-03 DIAGNOSIS — I361 Nonrheumatic tricuspid (valve) insufficiency: Secondary | ICD-10-CM | POA: Diagnosis not present

## 2020-03-03 DIAGNOSIS — I272 Pulmonary hypertension, unspecified: Secondary | ICD-10-CM | POA: Diagnosis not present

## 2020-03-10 DIAGNOSIS — E1065 Type 1 diabetes mellitus with hyperglycemia: Secondary | ICD-10-CM | POA: Diagnosis not present

## 2020-03-10 DIAGNOSIS — E108 Type 1 diabetes mellitus with unspecified complications: Secondary | ICD-10-CM | POA: Diagnosis not present

## 2020-03-10 DIAGNOSIS — E109 Type 1 diabetes mellitus without complications: Secondary | ICD-10-CM | POA: Diagnosis not present

## 2020-03-10 DIAGNOSIS — Z794 Long term (current) use of insulin: Secondary | ICD-10-CM | POA: Diagnosis not present

## 2020-03-11 DIAGNOSIS — Z940 Kidney transplant status: Secondary | ICD-10-CM | POA: Diagnosis not present

## 2020-03-11 DIAGNOSIS — Z9483 Pancreas transplant status: Secondary | ICD-10-CM | POA: Diagnosis not present

## 2020-03-30 DIAGNOSIS — R0989 Other specified symptoms and signs involving the circulatory and respiratory systems: Secondary | ICD-10-CM | POA: Diagnosis not present

## 2020-03-30 DIAGNOSIS — Z940 Kidney transplant status: Secondary | ICD-10-CM | POA: Diagnosis not present

## 2020-03-30 DIAGNOSIS — I120 Hypertensive chronic kidney disease with stage 5 chronic kidney disease or end stage renal disease: Secondary | ICD-10-CM | POA: Diagnosis not present

## 2020-03-30 DIAGNOSIS — Z01818 Encounter for other preprocedural examination: Secondary | ICD-10-CM | POA: Diagnosis not present

## 2020-03-30 DIAGNOSIS — N186 End stage renal disease: Secondary | ICD-10-CM | POA: Diagnosis not present

## 2020-03-30 DIAGNOSIS — Z4822 Encounter for aftercare following kidney transplant: Secondary | ICD-10-CM | POA: Diagnosis not present

## 2020-03-30 DIAGNOSIS — Z4659 Encounter for fitting and adjustment of other gastrointestinal appliance and device: Secondary | ICD-10-CM | POA: Diagnosis not present

## 2020-03-30 DIAGNOSIS — N185 Chronic kidney disease, stage 5: Secondary | ICD-10-CM | POA: Diagnosis not present

## 2020-03-30 DIAGNOSIS — Z9483 Pancreas transplant status: Secondary | ICD-10-CM | POA: Diagnosis not present

## 2020-03-30 DIAGNOSIS — E1022 Type 1 diabetes mellitus with diabetic chronic kidney disease: Secondary | ICD-10-CM | POA: Diagnosis not present

## 2020-03-30 DIAGNOSIS — N184 Chronic kidney disease, stage 4 (severe): Secondary | ICD-10-CM | POA: Diagnosis not present

## 2020-03-31 DIAGNOSIS — Z48298 Encounter for aftercare following other organ transplant: Secondary | ICD-10-CM | POA: Diagnosis not present

## 2020-03-31 DIAGNOSIS — N184 Chronic kidney disease, stage 4 (severe): Secondary | ICD-10-CM | POA: Diagnosis not present

## 2020-03-31 DIAGNOSIS — Z5181 Encounter for therapeutic drug level monitoring: Secondary | ICD-10-CM | POA: Diagnosis not present

## 2020-03-31 DIAGNOSIS — Z79899 Other long term (current) drug therapy: Secondary | ICD-10-CM | POA: Diagnosis not present

## 2020-03-31 DIAGNOSIS — Z4822 Encounter for aftercare following kidney transplant: Secondary | ICD-10-CM | POA: Diagnosis not present

## 2020-03-31 DIAGNOSIS — E1022 Type 1 diabetes mellitus with diabetic chronic kidney disease: Secondary | ICD-10-CM | POA: Diagnosis not present

## 2020-04-01 DIAGNOSIS — D84821 Immunodeficiency due to drugs: Secondary | ICD-10-CM | POA: Diagnosis not present

## 2020-04-01 DIAGNOSIS — Z940 Kidney transplant status: Secondary | ICD-10-CM | POA: Diagnosis not present

## 2020-04-01 DIAGNOSIS — Z79899 Other long term (current) drug therapy: Secondary | ICD-10-CM | POA: Diagnosis not present

## 2020-04-01 DIAGNOSIS — Z48298 Encounter for aftercare following other organ transplant: Secondary | ICD-10-CM | POA: Diagnosis not present

## 2020-04-01 DIAGNOSIS — R748 Abnormal levels of other serum enzymes: Secondary | ICD-10-CM | POA: Diagnosis not present

## 2020-04-01 DIAGNOSIS — I151 Hypertension secondary to other renal disorders: Secondary | ICD-10-CM | POA: Diagnosis not present

## 2020-04-01 DIAGNOSIS — Z4822 Encounter for aftercare following kidney transplant: Secondary | ICD-10-CM | POA: Diagnosis not present

## 2020-04-01 DIAGNOSIS — Z9483 Pancreas transplant status: Secondary | ICD-10-CM | POA: Diagnosis not present

## 2020-04-02 DIAGNOSIS — E870 Hyperosmolality and hypernatremia: Secondary | ICD-10-CM | POA: Diagnosis not present

## 2020-04-02 DIAGNOSIS — Z940 Kidney transplant status: Secondary | ICD-10-CM | POA: Diagnosis not present

## 2020-04-02 DIAGNOSIS — Z79899 Other long term (current) drug therapy: Secondary | ICD-10-CM | POA: Diagnosis not present

## 2020-04-02 DIAGNOSIS — Z9483 Pancreas transplant status: Secondary | ICD-10-CM | POA: Diagnosis not present

## 2020-04-02 DIAGNOSIS — K6389 Other specified diseases of intestine: Secondary | ICD-10-CM | POA: Diagnosis not present

## 2020-04-03 DIAGNOSIS — R0902 Hypoxemia: Secondary | ICD-10-CM | POA: Diagnosis not present

## 2020-04-03 DIAGNOSIS — R600 Localized edema: Secondary | ICD-10-CM | POA: Diagnosis not present

## 2020-04-03 DIAGNOSIS — Z940 Kidney transplant status: Secondary | ICD-10-CM | POA: Diagnosis not present

## 2020-04-03 DIAGNOSIS — I82712 Chronic embolism and thrombosis of superficial veins of left upper extremity: Secondary | ICD-10-CM | POA: Diagnosis not present

## 2020-04-03 DIAGNOSIS — I151 Hypertension secondary to other renal disorders: Secondary | ICD-10-CM | POA: Diagnosis not present

## 2020-04-03 DIAGNOSIS — R0682 Tachypnea, not elsewhere classified: Secondary | ICD-10-CM | POA: Diagnosis not present

## 2020-04-03 DIAGNOSIS — R109 Unspecified abdominal pain: Secondary | ICD-10-CM | POA: Diagnosis not present

## 2020-04-03 DIAGNOSIS — N184 Chronic kidney disease, stage 4 (severe): Secondary | ICD-10-CM | POA: Diagnosis not present

## 2020-04-03 DIAGNOSIS — R140 Abdominal distension (gaseous): Secondary | ICD-10-CM | POA: Diagnosis not present

## 2020-04-03 DIAGNOSIS — R918 Other nonspecific abnormal finding of lung field: Secondary | ICD-10-CM | POA: Diagnosis not present

## 2020-04-03 DIAGNOSIS — Z4659 Encounter for fitting and adjustment of other gastrointestinal appliance and device: Secondary | ICD-10-CM | POA: Diagnosis not present

## 2020-04-03 DIAGNOSIS — Z950 Presence of cardiac pacemaker: Secondary | ICD-10-CM | POA: Diagnosis not present

## 2020-04-03 DIAGNOSIS — Z79899 Other long term (current) drug therapy: Secondary | ICD-10-CM | POA: Diagnosis not present

## 2020-04-03 DIAGNOSIS — I10 Essential (primary) hypertension: Secondary | ICD-10-CM | POA: Diagnosis not present

## 2020-04-03 DIAGNOSIS — E1022 Type 1 diabetes mellitus with diabetic chronic kidney disease: Secondary | ICD-10-CM | POA: Diagnosis not present

## 2020-04-03 DIAGNOSIS — Z4822 Encounter for aftercare following kidney transplant: Secondary | ICD-10-CM | POA: Diagnosis not present

## 2020-04-03 DIAGNOSIS — R000 Tachycardia, unspecified: Secondary | ICD-10-CM | POA: Diagnosis not present

## 2020-04-03 DIAGNOSIS — Z48298 Encounter for aftercare following other organ transplant: Secondary | ICD-10-CM | POA: Diagnosis not present

## 2020-04-03 DIAGNOSIS — R0602 Shortness of breath: Secondary | ICD-10-CM | POA: Diagnosis not present

## 2020-04-03 DIAGNOSIS — Z9483 Pancreas transplant status: Secondary | ICD-10-CM | POA: Diagnosis not present

## 2020-04-04 DIAGNOSIS — K567 Ileus, unspecified: Secondary | ICD-10-CM | POA: Diagnosis not present

## 2020-04-04 DIAGNOSIS — Z79899 Other long term (current) drug therapy: Secondary | ICD-10-CM | POA: Diagnosis not present

## 2020-04-04 DIAGNOSIS — R918 Other nonspecific abnormal finding of lung field: Secondary | ICD-10-CM | POA: Diagnosis not present

## 2020-04-04 DIAGNOSIS — N184 Chronic kidney disease, stage 4 (severe): Secondary | ICD-10-CM | POA: Diagnosis not present

## 2020-04-04 DIAGNOSIS — R000 Tachycardia, unspecified: Secondary | ICD-10-CM | POA: Diagnosis not present

## 2020-04-04 DIAGNOSIS — E1022 Type 1 diabetes mellitus with diabetic chronic kidney disease: Secondary | ICD-10-CM | POA: Diagnosis not present

## 2020-04-04 DIAGNOSIS — Z940 Kidney transplant status: Secondary | ICD-10-CM | POA: Diagnosis not present

## 2020-04-04 DIAGNOSIS — Z48298 Encounter for aftercare following other organ transplant: Secondary | ICD-10-CM | POA: Diagnosis not present

## 2020-04-04 DIAGNOSIS — R0682 Tachypnea, not elsewhere classified: Secondary | ICD-10-CM | POA: Diagnosis not present

## 2020-04-04 DIAGNOSIS — I151 Hypertension secondary to other renal disorders: Secondary | ICD-10-CM | POA: Diagnosis not present

## 2020-04-04 DIAGNOSIS — T8619 Other complication of kidney transplant: Secondary | ICD-10-CM | POA: Diagnosis not present

## 2020-04-04 DIAGNOSIS — Z9483 Pancreas transplant status: Secondary | ICD-10-CM | POA: Diagnosis not present

## 2020-04-04 DIAGNOSIS — J90 Pleural effusion, not elsewhere classified: Secondary | ICD-10-CM | POA: Diagnosis not present

## 2020-04-04 DIAGNOSIS — R0902 Hypoxemia: Secondary | ICD-10-CM | POA: Diagnosis not present

## 2020-04-04 DIAGNOSIS — Z4822 Encounter for aftercare following kidney transplant: Secondary | ICD-10-CM | POA: Diagnosis not present

## 2020-04-05 DIAGNOSIS — K631 Perforation of intestine (nontraumatic): Secondary | ICD-10-CM | POA: Diagnosis not present

## 2020-04-05 DIAGNOSIS — J690 Pneumonitis due to inhalation of food and vomit: Secondary | ICD-10-CM | POA: Diagnosis not present

## 2020-04-05 DIAGNOSIS — R918 Other nonspecific abnormal finding of lung field: Secondary | ICD-10-CM | POA: Diagnosis not present

## 2020-04-05 DIAGNOSIS — N1330 Unspecified hydronephrosis: Secondary | ICD-10-CM | POA: Diagnosis not present

## 2020-04-05 DIAGNOSIS — K8592 Acute pancreatitis with infected necrosis, unspecified: Secondary | ICD-10-CM | POA: Diagnosis not present

## 2020-04-05 DIAGNOSIS — J810 Acute pulmonary edema: Secondary | ICD-10-CM | POA: Diagnosis not present

## 2020-04-05 DIAGNOSIS — Z940 Kidney transplant status: Secondary | ICD-10-CM | POA: Diagnosis not present

## 2020-04-05 DIAGNOSIS — E870 Hyperosmolality and hypernatremia: Secondary | ICD-10-CM | POA: Diagnosis not present

## 2020-04-05 DIAGNOSIS — Z79899 Other long term (current) drug therapy: Secondary | ICD-10-CM | POA: Diagnosis not present

## 2020-04-05 DIAGNOSIS — Z9483 Pancreas transplant status: Secondary | ICD-10-CM | POA: Diagnosis not present

## 2020-04-05 DIAGNOSIS — Z4659 Encounter for fitting and adjustment of other gastrointestinal appliance and device: Secondary | ICD-10-CM | POA: Diagnosis not present

## 2020-04-06 DIAGNOSIS — Z79899 Other long term (current) drug therapy: Secondary | ICD-10-CM | POA: Diagnosis not present

## 2020-04-06 DIAGNOSIS — E1022 Type 1 diabetes mellitus with diabetic chronic kidney disease: Secondary | ICD-10-CM | POA: Diagnosis not present

## 2020-04-06 DIAGNOSIS — Z940 Kidney transplant status: Secondary | ICD-10-CM | POA: Diagnosis not present

## 2020-04-06 DIAGNOSIS — Z4659 Encounter for fitting and adjustment of other gastrointestinal appliance and device: Secondary | ICD-10-CM | POA: Diagnosis not present

## 2020-04-06 DIAGNOSIS — T8619 Other complication of kidney transplant: Secondary | ICD-10-CM | POA: Diagnosis not present

## 2020-04-06 DIAGNOSIS — R4182 Altered mental status, unspecified: Secondary | ICD-10-CM | POA: Diagnosis not present

## 2020-04-06 DIAGNOSIS — N185 Chronic kidney disease, stage 5: Secondary | ICD-10-CM | POA: Diagnosis not present

## 2020-04-06 DIAGNOSIS — I871 Compression of vein: Secondary | ICD-10-CM | POA: Diagnosis not present

## 2020-04-06 DIAGNOSIS — Z978 Presence of other specified devices: Secondary | ICD-10-CM | POA: Diagnosis not present

## 2020-04-06 DIAGNOSIS — Z9483 Pancreas transplant status: Secondary | ICD-10-CM | POA: Diagnosis not present

## 2020-04-06 DIAGNOSIS — E870 Hyperosmolality and hypernatremia: Secondary | ICD-10-CM | POA: Diagnosis not present

## 2020-04-07 DIAGNOSIS — Z940 Kidney transplant status: Secondary | ICD-10-CM | POA: Diagnosis not present

## 2020-04-07 DIAGNOSIS — Z79899 Other long term (current) drug therapy: Secondary | ICD-10-CM | POA: Diagnosis not present

## 2020-04-07 DIAGNOSIS — J690 Pneumonitis due to inhalation of food and vomit: Secondary | ICD-10-CM | POA: Diagnosis not present

## 2020-04-07 DIAGNOSIS — Z9483 Pancreas transplant status: Secondary | ICD-10-CM | POA: Diagnosis not present

## 2020-04-07 DIAGNOSIS — Z4659 Encounter for fitting and adjustment of other gastrointestinal appliance and device: Secondary | ICD-10-CM | POA: Diagnosis not present

## 2020-04-07 DIAGNOSIS — Z4822 Encounter for aftercare following kidney transplant: Secondary | ICD-10-CM | POA: Diagnosis not present

## 2020-04-07 DIAGNOSIS — E870 Hyperosmolality and hypernatremia: Secondary | ICD-10-CM | POA: Diagnosis not present

## 2020-04-08 DIAGNOSIS — R944 Abnormal results of kidney function studies: Secondary | ICD-10-CM | POA: Diagnosis not present

## 2020-04-08 DIAGNOSIS — E870 Hyperosmolality and hypernatremia: Secondary | ICD-10-CM | POA: Diagnosis not present

## 2020-04-08 DIAGNOSIS — Z79899 Other long term (current) drug therapy: Secondary | ICD-10-CM | POA: Diagnosis not present

## 2020-04-08 DIAGNOSIS — D8481 Immunodeficiency due to conditions classified elsewhere: Secondary | ICD-10-CM | POA: Diagnosis not present

## 2020-04-08 DIAGNOSIS — E109 Type 1 diabetes mellitus without complications: Secondary | ICD-10-CM | POA: Diagnosis not present

## 2020-04-08 DIAGNOSIS — Z9483 Pancreas transplant status: Secondary | ICD-10-CM | POA: Diagnosis not present

## 2020-04-08 DIAGNOSIS — Z940 Kidney transplant status: Secondary | ICD-10-CM | POA: Diagnosis not present

## 2020-04-09 DIAGNOSIS — E1022 Type 1 diabetes mellitus with diabetic chronic kidney disease: Secondary | ICD-10-CM | POA: Diagnosis not present

## 2020-04-09 DIAGNOSIS — Z9483 Pancreas transplant status: Secondary | ICD-10-CM | POA: Diagnosis not present

## 2020-04-09 DIAGNOSIS — N184 Chronic kidney disease, stage 4 (severe): Secondary | ICD-10-CM | POA: Diagnosis not present

## 2020-04-09 DIAGNOSIS — Z7952 Long term (current) use of systemic steroids: Secondary | ICD-10-CM | POA: Diagnosis not present

## 2020-04-09 DIAGNOSIS — Z79899 Other long term (current) drug therapy: Secondary | ICD-10-CM | POA: Diagnosis not present

## 2020-04-09 DIAGNOSIS — Z4659 Encounter for fitting and adjustment of other gastrointestinal appliance and device: Secondary | ICD-10-CM | POA: Diagnosis not present

## 2020-04-09 DIAGNOSIS — Z940 Kidney transplant status: Secondary | ICD-10-CM | POA: Diagnosis not present

## 2020-04-10 DIAGNOSIS — Z940 Kidney transplant status: Secondary | ICD-10-CM | POA: Diagnosis not present

## 2020-04-10 DIAGNOSIS — Z9483 Pancreas transplant status: Secondary | ICD-10-CM | POA: Diagnosis not present

## 2020-04-10 DIAGNOSIS — Z79899 Other long term (current) drug therapy: Secondary | ICD-10-CM | POA: Diagnosis not present

## 2020-04-10 DIAGNOSIS — Z7952 Long term (current) use of systemic steroids: Secondary | ICD-10-CM | POA: Diagnosis not present

## 2020-04-11 DIAGNOSIS — Z7952 Long term (current) use of systemic steroids: Secondary | ICD-10-CM | POA: Diagnosis not present

## 2020-04-11 DIAGNOSIS — Z940 Kidney transplant status: Secondary | ICD-10-CM | POA: Diagnosis not present

## 2020-04-11 DIAGNOSIS — Z9483 Pancreas transplant status: Secondary | ICD-10-CM | POA: Diagnosis not present

## 2020-04-11 DIAGNOSIS — K921 Melena: Secondary | ICD-10-CM | POA: Diagnosis not present

## 2020-04-11 DIAGNOSIS — Z79899 Other long term (current) drug therapy: Secondary | ICD-10-CM | POA: Diagnosis not present

## 2020-04-11 DIAGNOSIS — Z4659 Encounter for fitting and adjustment of other gastrointestinal appliance and device: Secondary | ICD-10-CM | POA: Diagnosis not present

## 2020-04-12 DIAGNOSIS — Z79899 Other long term (current) drug therapy: Secondary | ICD-10-CM | POA: Diagnosis not present

## 2020-04-12 DIAGNOSIS — Z940 Kidney transplant status: Secondary | ICD-10-CM | POA: Diagnosis not present

## 2020-04-12 DIAGNOSIS — Z4822 Encounter for aftercare following kidney transplant: Secondary | ICD-10-CM | POA: Diagnosis not present

## 2020-04-12 DIAGNOSIS — Z7952 Long term (current) use of systemic steroids: Secondary | ICD-10-CM | POA: Diagnosis not present

## 2020-04-12 DIAGNOSIS — Z9483 Pancreas transplant status: Secondary | ICD-10-CM | POA: Diagnosis not present

## 2020-04-13 DIAGNOSIS — Z79899 Other long term (current) drug therapy: Secondary | ICD-10-CM | POA: Diagnosis not present

## 2020-04-13 DIAGNOSIS — Z940 Kidney transplant status: Secondary | ICD-10-CM | POA: Diagnosis not present

## 2020-04-13 DIAGNOSIS — D84821 Immunodeficiency due to drugs: Secondary | ICD-10-CM | POA: Diagnosis not present

## 2020-04-14 DIAGNOSIS — E876 Hypokalemia: Secondary | ICD-10-CM | POA: Diagnosis not present

## 2020-04-14 DIAGNOSIS — Z940 Kidney transplant status: Secondary | ICD-10-CM | POA: Diagnosis not present

## 2020-04-14 DIAGNOSIS — Z79899 Other long term (current) drug therapy: Secondary | ICD-10-CM | POA: Diagnosis not present

## 2020-04-14 DIAGNOSIS — D84821 Immunodeficiency due to drugs: Secondary | ICD-10-CM | POA: Diagnosis not present

## 2020-04-15 DIAGNOSIS — E876 Hypokalemia: Secondary | ICD-10-CM | POA: Diagnosis not present

## 2020-04-15 DIAGNOSIS — Z79899 Other long term (current) drug therapy: Secondary | ICD-10-CM | POA: Diagnosis not present

## 2020-04-15 DIAGNOSIS — D84821 Immunodeficiency due to drugs: Secondary | ICD-10-CM | POA: Diagnosis not present

## 2020-04-15 DIAGNOSIS — Z940 Kidney transplant status: Secondary | ICD-10-CM | POA: Diagnosis not present

## 2020-04-16 DIAGNOSIS — D84821 Immunodeficiency due to drugs: Secondary | ICD-10-CM | POA: Diagnosis not present

## 2020-04-16 DIAGNOSIS — Z940 Kidney transplant status: Secondary | ICD-10-CM | POA: Diagnosis not present

## 2020-04-16 DIAGNOSIS — J810 Acute pulmonary edema: Secondary | ICD-10-CM | POA: Diagnosis not present

## 2020-04-16 DIAGNOSIS — Z79899 Other long term (current) drug therapy: Secondary | ICD-10-CM | POA: Diagnosis not present

## 2020-04-16 DIAGNOSIS — Z9483 Pancreas transplant status: Secondary | ICD-10-CM | POA: Diagnosis not present

## 2020-04-16 DIAGNOSIS — E876 Hypokalemia: Secondary | ICD-10-CM | POA: Diagnosis not present

## 2020-04-16 DIAGNOSIS — J90 Pleural effusion, not elsewhere classified: Secondary | ICD-10-CM | POA: Diagnosis not present

## 2020-04-17 DIAGNOSIS — I10 Essential (primary) hypertension: Secondary | ICD-10-CM | POA: Diagnosis not present

## 2020-04-17 DIAGNOSIS — D84821 Immunodeficiency due to drugs: Secondary | ICD-10-CM | POA: Diagnosis not present

## 2020-04-17 DIAGNOSIS — Z940 Kidney transplant status: Secondary | ICD-10-CM | POA: Diagnosis not present

## 2020-04-17 DIAGNOSIS — Z79899 Other long term (current) drug therapy: Secondary | ICD-10-CM | POA: Diagnosis not present

## 2020-04-18 DIAGNOSIS — Z9483 Pancreas transplant status: Secondary | ICD-10-CM | POA: Diagnosis not present

## 2020-04-18 DIAGNOSIS — I10 Essential (primary) hypertension: Secondary | ICD-10-CM | POA: Diagnosis not present

## 2020-04-18 DIAGNOSIS — Z940 Kidney transplant status: Secondary | ICD-10-CM | POA: Diagnosis not present

## 2020-04-18 DIAGNOSIS — Z79899 Other long term (current) drug therapy: Secondary | ICD-10-CM | POA: Diagnosis not present

## 2020-04-18 DIAGNOSIS — Z4822 Encounter for aftercare following kidney transplant: Secondary | ICD-10-CM | POA: Diagnosis not present

## 2020-04-18 DIAGNOSIS — D84821 Immunodeficiency due to drugs: Secondary | ICD-10-CM | POA: Diagnosis not present

## 2020-04-19 DIAGNOSIS — N186 End stage renal disease: Secondary | ICD-10-CM | POA: Diagnosis not present

## 2020-04-19 DIAGNOSIS — E1065 Type 1 diabetes mellitus with hyperglycemia: Secondary | ICD-10-CM | POA: Diagnosis not present

## 2020-04-19 DIAGNOSIS — E1022 Type 1 diabetes mellitus with diabetic chronic kidney disease: Secondary | ICD-10-CM | POA: Diagnosis not present

## 2020-04-19 DIAGNOSIS — E109 Type 1 diabetes mellitus without complications: Secondary | ICD-10-CM | POA: Diagnosis not present

## 2020-04-20 DIAGNOSIS — R140 Abdominal distension (gaseous): Secondary | ICD-10-CM | POA: Diagnosis not present

## 2020-04-20 DIAGNOSIS — N186 End stage renal disease: Secondary | ICD-10-CM | POA: Diagnosis not present

## 2020-04-20 DIAGNOSIS — E109 Type 1 diabetes mellitus without complications: Secondary | ICD-10-CM | POA: Diagnosis not present

## 2020-04-20 DIAGNOSIS — Z9483 Pancreas transplant status: Secondary | ICD-10-CM | POA: Diagnosis not present

## 2020-04-20 DIAGNOSIS — E1065 Type 1 diabetes mellitus with hyperglycemia: Secondary | ICD-10-CM | POA: Diagnosis not present

## 2020-04-20 DIAGNOSIS — Z940 Kidney transplant status: Secondary | ICD-10-CM | POA: Diagnosis not present

## 2020-04-20 DIAGNOSIS — E1022 Type 1 diabetes mellitus with diabetic chronic kidney disease: Secondary | ICD-10-CM | POA: Diagnosis not present

## 2020-04-21 DIAGNOSIS — R739 Hyperglycemia, unspecified: Secondary | ICD-10-CM | POA: Diagnosis not present

## 2020-04-21 DIAGNOSIS — N186 End stage renal disease: Secondary | ICD-10-CM | POA: Diagnosis not present

## 2020-04-21 DIAGNOSIS — R7309 Other abnormal glucose: Secondary | ICD-10-CM | POA: Diagnosis not present

## 2020-04-21 DIAGNOSIS — E1065 Type 1 diabetes mellitus with hyperglycemia: Secondary | ICD-10-CM | POA: Diagnosis not present

## 2020-04-22 DIAGNOSIS — R739 Hyperglycemia, unspecified: Secondary | ICD-10-CM | POA: Diagnosis not present

## 2020-04-22 DIAGNOSIS — E1065 Type 1 diabetes mellitus with hyperglycemia: Secondary | ICD-10-CM | POA: Diagnosis not present

## 2020-04-22 DIAGNOSIS — N186 End stage renal disease: Secondary | ICD-10-CM | POA: Diagnosis not present

## 2020-04-22 DIAGNOSIS — R7309 Other abnormal glucose: Secondary | ICD-10-CM | POA: Diagnosis not present

## 2020-04-23 DIAGNOSIS — R739 Hyperglycemia, unspecified: Secondary | ICD-10-CM | POA: Diagnosis not present

## 2020-04-23 DIAGNOSIS — E1065 Type 1 diabetes mellitus with hyperglycemia: Secondary | ICD-10-CM | POA: Diagnosis not present

## 2020-04-23 DIAGNOSIS — N186 End stage renal disease: Secondary | ICD-10-CM | POA: Diagnosis not present

## 2020-04-23 DIAGNOSIS — R7309 Other abnormal glucose: Secondary | ICD-10-CM | POA: Diagnosis not present

## 2020-04-25 DIAGNOSIS — J90 Pleural effusion, not elsewhere classified: Secondary | ICD-10-CM | POA: Diagnosis not present

## 2020-04-25 DIAGNOSIS — Z9483 Pancreas transplant status: Secondary | ICD-10-CM | POA: Diagnosis not present

## 2020-04-25 DIAGNOSIS — R188 Other ascites: Secondary | ICD-10-CM | POA: Diagnosis not present

## 2020-04-25 DIAGNOSIS — Z940 Kidney transplant status: Secondary | ICD-10-CM | POA: Diagnosis not present

## 2023-01-10 LAB — LAB REPORT - SCANNED: EGFR: 71

## 2023-01-17 ENCOUNTER — Encounter: Payer: Self-pay | Admitting: Nephrology

## 2023-02-03 ENCOUNTER — Emergency Department (HOSPITAL_COMMUNITY)
Admission: EM | Admit: 2023-02-03 | Discharge: 2023-02-03 | Disposition: A | Discharge status: Home / Self Care | Payer: Medicare HMO | Attending: Emergency Medicine | Admitting: Emergency Medicine

## 2023-02-03 ENCOUNTER — Emergency Department (HOSPITAL_COMMUNITY): Payer: Medicare HMO

## 2023-02-03 ENCOUNTER — Other Ambulatory Visit: Payer: Self-pay

## 2023-02-03 ENCOUNTER — Encounter (HOSPITAL_COMMUNITY): Payer: Self-pay

## 2023-02-03 DIAGNOSIS — E1122 Type 2 diabetes mellitus with diabetic chronic kidney disease: Secondary | ICD-10-CM | POA: Diagnosis not present

## 2023-02-03 DIAGNOSIS — Z79899 Other long term (current) drug therapy: Secondary | ICD-10-CM | POA: Insufficient documentation

## 2023-02-03 DIAGNOSIS — I129 Hypertensive chronic kidney disease with stage 1 through stage 4 chronic kidney disease, or unspecified chronic kidney disease: Secondary | ICD-10-CM | POA: Diagnosis not present

## 2023-02-03 DIAGNOSIS — Z7982 Long term (current) use of aspirin: Secondary | ICD-10-CM | POA: Diagnosis not present

## 2023-02-03 DIAGNOSIS — R079 Chest pain, unspecified: Secondary | ICD-10-CM | POA: Insufficient documentation

## 2023-02-03 DIAGNOSIS — Z794 Long term (current) use of insulin: Secondary | ICD-10-CM | POA: Diagnosis not present

## 2023-02-03 DIAGNOSIS — B029 Zoster without complications: Secondary | ICD-10-CM | POA: Diagnosis not present

## 2023-02-03 DIAGNOSIS — Z7901 Long term (current) use of anticoagulants: Secondary | ICD-10-CM | POA: Insufficient documentation

## 2023-02-03 DIAGNOSIS — N189 Chronic kidney disease, unspecified: Secondary | ICD-10-CM | POA: Diagnosis not present

## 2023-02-03 LAB — CBC WITH DIFFERENTIAL/PLATELET
Abs Immature Granulocytes: 0.16 10*3/uL — ABNORMAL HIGH (ref 0.00–0.07)
Basophils Absolute: 0 10*3/uL (ref 0.0–0.1)
Basophils Relative: 1 %
Eosinophils Absolute: 0.1 10*3/uL (ref 0.0–0.5)
Eosinophils Relative: 1 %
HCT: 43.9 % (ref 39.0–52.0)
Hemoglobin: 13.3 g/dL (ref 13.0–17.0)
Immature Granulocytes: 3 %
Lymphocytes Relative: 6 %
Lymphs Abs: 0.4 10*3/uL — ABNORMAL LOW (ref 0.7–4.0)
MCH: 25.3 pg — ABNORMAL LOW (ref 26.0–34.0)
MCHC: 30.3 g/dL (ref 30.0–36.0)
MCV: 83.5 fL (ref 80.0–100.0)
Monocytes Absolute: 0.8 10*3/uL (ref 0.1–1.0)
Monocytes Relative: 14 %
Neutro Abs: 4.3 10*3/uL (ref 1.7–7.7)
Neutrophils Relative %: 75 %
Platelets: 201 10*3/uL (ref 150–400)
RBC: 5.26 MIL/uL (ref 4.22–5.81)
RDW: 15.9 % — ABNORMAL HIGH (ref 11.5–15.5)
WBC: 5.7 10*3/uL (ref 4.0–10.5)
nRBC: 0 % (ref 0.0–0.2)

## 2023-02-03 LAB — BASIC METABOLIC PANEL
Anion gap: 12 (ref 5–15)
BUN: 19 mg/dL (ref 8–23)
CO2: 23 mmol/L (ref 22–32)
Calcium: 8.9 mg/dL (ref 8.9–10.3)
Chloride: 105 mmol/L (ref 98–111)
Creatinine, Ser: 1.18 mg/dL (ref 0.61–1.24)
GFR, Estimated: >60 mL/min (ref >60)
Glucose, Bld: 142 mg/dL — ABNORMAL HIGH (ref 70–99)
Potassium: 3.9 mmol/L (ref 3.5–5.1)
Sodium: 140 mmol/L (ref 135–145)

## 2023-02-03 LAB — TROPONIN I (HIGH SENSITIVITY)
Troponin I (High Sensitivity): 10 ng/L (ref <18)
Troponin I (High Sensitivity): 10 ng/L (ref <18)

## 2023-02-03 LAB — D-DIMER, QUANTITATIVE: D-Dimer, Quant: 0.78 ug/mL-FEU — ABNORMAL HIGH (ref 0.00–0.50)

## 2023-02-03 MED ORDER — OXYCODONE-ACETAMINOPHEN 5-325 MG PO TABS: 1.0000 | ORAL_TABLET | Freq: Four times a day (QID) | ORAL | 0 refills | Status: DC | PRN

## 2023-02-03 MED ORDER — VALACYCLOVIR HCL 1 G PO TABS: 1000.0000 mg | ORAL_TABLET | Freq: Three times a day (TID) | ORAL | 0 refills | Status: AC

## 2023-02-03 MED ORDER — IOHEXOL 350 MG/ML SOLN
75.0000 mL | Freq: Once | INTRAVENOUS | Status: AC | PRN
  Administered 2023-02-03: 75 mL via INTRAVENOUS

## 2023-02-03 MED ORDER — OXYCODONE-ACETAMINOPHEN 5-325 MG PO TABS
1.0000 | ORAL_TABLET | Freq: Once | ORAL | Status: AC
  Administered 2023-02-03: 1 via ORAL
  Filled 2023-02-03: qty 1

## 2023-02-03 MED ORDER — KETOROLAC TROMETHAMINE 15 MG/ML IJ SOLN
15.0000 mg | Freq: Once | INTRAMUSCULAR | Status: AC
  Administered 2023-02-03: 15 mg via INTRAVENOUS
  Filled 2023-02-03: qty 1

## 2023-02-03 NOTE — ED Notes (Signed)
Pt transported to CT ?

## 2023-02-03 NOTE — ED Triage Notes (Signed)
Pt BIBEMS w cc of CP x1day. Pt states CP is on left side, non-radiating described as sharp and worsens with taking a deep breathe and moving. Pt denies any cardiac hx. Took 325 asp pta with no relief in pain.

## 2023-02-03 NOTE — ED Provider Notes (Signed)
Daniel Santos EMERGENCY DEPARTMENT AT [facility] Provider Note   CSN: [id number] Arrival date & time: 02/03/23  7829     History  Chief Complaint  Patient presents with   Chest Pain    Daniel Santos. is a 62 y.o. male with history of chronic kidney disease, TIA or stroke, diabetes on insulin, high cholesterol, renal transplant, hypertension, presenting to the ED with complaint of left-sided chest pain.  Patient reports gradual onset of left-sided chest pain about 1 day ago.  It is waxing and waning, with worse with deep inspiration.  He has never had this issue before.  He denies radiation into his arms or jaw.  He denies any history of MI or coronary disease.  EMS brought the patient and gave him 3 and 25 mg of aspirin, nitroglycerin, with no relief of his pain.\  He has a fistula left arm that was placed in the past but never used, as he did not need to go onto dialysis.  Patient on aspirin and plavix Patient on azathioprine  HPI     Home Medications Prior to Admission medications   Medication Sig Start Date End Date Taking? Authorizing Provider  oxyCODONE-acetaminophen (PERCOCET/ROXICET) 5-325 MG tablet Take 1 tablet by mouth every 6 (six) hours as needed for up to 15 doses for severe pain. 02/03/23  Yes Ryelle Ruvalcaba, Kermit Balo, MD  valACYclovir (VALTREX) 1000 MG tablet Take 1 tablet (1,000 mg total) by mouth 3 (three) times daily for 7 days. 02/03/23 02/10/23 Yes Bianca Raneri, Kermit Balo, MD  amLODipine (NORVASC) 10 MG tablet Take 10 mg by mouth at bedtime.     [provider]  aspirin EC 325 MG EC tablet Take 1 tablet (325 mg total) by mouth daily. 03/24/17   Rai, Delene Ruffini, MD  atorvastatin (LIPITOR) 40 MG tablet Take 1 tablet (40 mg total) every evening by mouth. 06/10/17   Levora Dredge, MD  BUTRANS 20 MCG/HR PTWK patch Place 20 mcg onto the skin every Monday.     [provider]  calcitRIOL (ROCALTROL) 0.25 MCG capsule Take 0.25 mcg by mouth daily.      [provider]  carvedilol (COREG) 25 MG tablet Take 1 tablet (25 mg total) by mouth 2 (two) times daily with a meal. 03/24/17 01/31/19  Rai, Ripudeep K, MD  clopidogrel (PLAVIX) 75 MG tablet Take 1 tablet (75 mg total) by mouth daily. 01/14/15   Rhetta Mura, MD  CONTOUR NEXT TEST test strip  09/19/18   [provider]  ferrous sulfate 325 (65 FE) MG tablet Take 325 mg by mouth at bedtime.     [provider]  Furosemide (LASIX PO) Take 160 mg by mouth 2 (two) times a day.     [provider]  insulin lispro (HUMALOG) 100 UNIT/ML injection See admin instructions. Uses via insulin pump per bolus rate    [provider]  LINZESS 145 MCG CAPS capsule Take 145 mcg by mouth daily as needed (for constipation).  10/14/14   [provider]  pantoprazole (PROTONIX) 20 MG tablet Take 1 tablet (20 mg total) by mouth daily before breakfast. 07/08/19   Iva Boop, MD      Allergies    Doxycycline    Review of Systems   Review of Systems  Physical Exam Updated Vital Signs BP (!) 155/83 (BP Location: Right Arm)   Pulse 81   Temp 98.5 F (36.9 C) (Oral)   Resp 20   Ht 5'  9" (1.753 m)   Wt 64.4 kg   SpO2 99%   BMI 20.97 kg/m  Physical Exam Constitutional:      General: He is not in acute distress. HENT:     Head: Normocephalic and atraumatic.  Eyes:     Conjunctiva/sclera: Conjunctivae normal.     Pupils: Pupils are equal, round, and reactive to light.  Cardiovascular:     Rate and Rhythm: Normal rate and regular rhythm.  Pulmonary:     Effort: Pulmonary effort is normal. No respiratory distress.  Abdominal:     General: There is no distension.     Tenderness: There is no abdominal tenderness.  Skin:    General: Skin is warm and dry.     Comments: Vesicular rash in a dermatomal distribution along the left side of the chest extending to the mid thoracic back Fistula left arm with palpable thrill  Neurological:     General: No  focal deficit present.     Mental Status: He is alert. Mental status is at baseline.  Psychiatric:        Mood and Affect: Mood normal.        Behavior: Behavior normal.     ED Results / Procedures / Treatments   Labs (all labs ordered are listed, but only abnormal results are displayed) Labs Reviewed  BASIC METABOLIC PANEL - Abnormal; Notable for the following components:      Result Value   Glucose, Bld 142 (*)    All other components within normal limits  CBC WITH DIFFERENTIAL/PLATELET - Abnormal; Notable for the following components:   MCH 25.3 (*)    RDW 15.9 (*)    Lymphs Abs 0.4 (*)    Abs Immature Granulocytes 0.16 (*)    All other components within normal limits  D-DIMER, QUANTITATIVE - Abnormal; Notable for the following components:   D-Dimer, Quant 0.78 (*)    All other components within normal limits  TROPONIN I (HIGH SENSITIVITY)  TROPONIN I (HIGH SENSITIVITY)    EKG EKG Interpretation Date/Time:  Saturday February 03 2023 07:37:26 EDT Ventricular Rate:  77 PR Interval:  160 QRS Duration:  90 QT Interval:  377 QTC Calculation: 427 R Axis:   79  Text Interpretation: Sinus rhythm Biatrial enlargement Probable left ventricular hypertrophy Confirmed by Alvester Chou [phone]) on 02/03/2023 8:10:34 AM  Radiology CT Angio Chest PE W and/or Wo Contrast  Result Date: 02/03/2023 CLINICAL DATA:  62 year old male with history of positive D-dimer. Left-sided chest pain. Suspected pulmonary embolism. EXAM: CT ANGIOGRAPHY CHEST WITH CONTRAST TECHNIQUE: Multidetector CT imaging of the chest was performed using the standard protocol during bolus administration of intravenous contrast. Multiplanar CT image reconstructions and MIPs were obtained to evaluate the vascular anatomy. RADIATION DOSE REDUCTION: This exam was performed according to the departmental dose-optimization program which includes automated exposure control, adjustment of the mA and/or kV according to patient size  and/or use of iterative reconstruction technique. CONTRAST:  75mL OMNIPAQUE IOHEXOL 350 MG/ML SOLN COMPARISON:  No priors. FINDINGS: Cardiovascular: No filling defect in the pulmonary arterial tree to suggest pulmonary embolism. Heart size is borderline enlarged with right atrial dilatation. There is no significant pericardial fluid, thickening or pericardial calcification. There is aortic atherosclerosis, as well as atherosclerosis of the great vessels of the mediastinum and the coronary arteries, including calcified atherosclerotic plaque in the left main and left anterior descending coronary arteries. Mediastinum/Nodes: No pathologically enlarged mediastinal or hilar lymph nodes. Esophagus is unremarkable in appearance. No axillary lymphadenopathy.  Lungs/Pleura: Retained secretions lying dependently in the trachea incidentally noted. No acute consolidative airspace disease. No pleural effusions. Areas of scarring or subsegmental atelectasis in the lung bases bilaterally, most severe in the left lower lobe. No definite suspicious appearing pulmonary nodules or masses are noted. Mild diffuse bronchial wall thickening with mild centrilobular and paraseptal emphysema. Upper Abdomen: Unremarkable. Musculoskeletal: There are no aggressive appearing lytic or blastic lesions noted in the visualized portions of the skeleton. Review of the MIP images confirms the above findings. IMPRESSION: 1. No acute findings in the thorax to account for the patient's symptoms. Specifically, no evidence of pulmonary embolism. 2. Aortic atherosclerosis, in addition to left main and left anterior descending coronary artery disease. Please note that although the presence of coronary artery calcium documents the presence of coronary artery disease, the severity of this disease and any potential stenosis cannot be assessed on this non-gated CT examination. Assessment for potential risk factor modification, dietary therapy or pharmacologic  therapy may be warranted, if clinically indicated. 3. Mild diffuse bronchial wall thickening with mild centrilobular and paraseptal emphysema; imaging findings suggestive of underlying COPD. 4. Mild scarring and/or subsegmental atelectasis in the lung bases bilaterally (left-greater-than-right). Aortic Atherosclerosis (ICD10-I70.0) and Emphysema (ICD10-J43.9). Electronically Signed   By: Trudie Reed M.D.   On: 02/03/2023 10:05   DG Chest 2 View  Result Date: 02/03/2023 CLINICAL DATA:  62 year old male with left side chest pain. EXAM: CHEST - 2 VIEW COMPARISON:  CT Chest, Abdomen, and Pelvis 08/11/2021 and earlier. FINDINGS: Emphysema by CT last year. Large lung volumes are stable since 2010. Mediastinal contours remain within normal limits. Visualized tracheal air column is within normal limits. No pneumothorax, pulmonary edema, pleural effusion or confluent lung opacity. No acute osseous abnormality identified. Negative visible bowel gas. IMPRESSION: Emphysema (ICD10-J43.9).  No acute cardiopulmonary abnormality. Electronically Signed   By: Odessa Fleming M.D.   On: 02/03/2023 08:00    Procedures Procedures    Medications Ordered in ED Medications  iohexol (OMNIPAQUE) 350 MG/ML injection 75 mL (75 mLs Intravenous Contrast Given 02/03/23 0943)  ketorolac (TORADOL) 15 MG/ML injection 15 mg (15 mg Intravenous Given 02/03/23 1449)  oxyCODONE-acetaminophen (PERCOCET/ROXICET) 5-325 MG per tablet 1 tablet (1 tablet Oral Given 02/03/23 1448)    ED Course/ Medical Decision Making/ A&P Clinical Course as of 02/03/23 2014  Sat Feb 03, 2023  1610 Positive ddimer -patient continues having some pleuritic left-sided chest pain with deep inspiration.  PE study has been ordered.  Creatinine and GFR are well within acceptable standards for iodine contrast [MT]    Clinical Course User Index [MT] Tanvi Gatling, Kermit Balo, MD                             Medical Decision Making Amount and/or Complexity of Data  Reviewed Labs: ordered. Radiology: ordered. ECG/medicine tests: ordered.  Risk Prescription drug management.   This patient presents to the Emergency Department with complaint of chest pain. This involves an extensive number of treatment options, and is a complaint that carries with it a high risk of complications and morbidity, given the patient's comorbidity, including hypertension, hyperlipidemia, diabetes, cardiovascular risk factors.The differential diagnosis includes ACS vs Pneumothorax vs Reflux/Gastritis vs MSK pain vs PE vs Pneumonia vs other.  Patient is also noted to have what appears to be an active herpes zoster outbreak along the left side of his chest, which correlates overall with the location of his symptoms.  The patient  was not aware of this, and says he has never had shingles before.  He does not describe burning or itching discomfort, and feels that his discomfort is "from deeper in my chest".   I ordered, reviewed, and interpreted labs.  Pertinent results include delta troponin negative, Ddimer elevated, no other emergent findings I ordered medication toradol percocet for chest pain I ordered imaging studies which included dg chest, CT PE  I independently visualized and interpreted imaging which showed no acute PE, no PNA, and the monitor tracing which showed NSR . I agree with the radiologist interpretation  I personally reviewed the patients ECG which showed sinus rhythm with no acute ischemic findings  After the interventions stated above, I reevaluated the patient and found that they were stable  Based on the patient's clinical exam, vital signs, risk factors, and ED testing, I felt that the patient's overall risk of life-threatening emergency such as ACS, PE, sepsis, or infection was low.  At this time, I felt the patient's presentation was most clinically consistent with zoster outbreak, nonspecific chest pain, but explained to the patient that this evaluation was  not a definitive diagnostic workup.  He does not want to try gabapentin for zoster.  He is on chronic prednisone already.  We'll start valtrex for zoster.  Although I think this is most likely zoster related, given the correlation of symptoms and timing and presentation of new shingles rash exactly with the onset of his left sided chest pain, I do think he should also follow up with his prior cardiologist, whom he hasn't seen in some time.  He has some CV risk factors, and he understands that a negative ED cardiac workup, while reassuring, is not a exhaustive cardiac workup.  Patient verbalized understanding.        Final Clinical Impression(s) / ED Diagnoses Final diagnoses:  Herpes zoster without complication  Chest pain, unspecified type    Rx / DC Orders ED Discharge Orders          Ordered    oxyCODONE-acetaminophen (PERCOCET/ROXICET) 5-325 MG tablet  Every 6 hours PRN        02/03/23 1532    valACYclovir (VALTREX) 1000 MG tablet  3 times daily        02/03/23 1532              Terald Sleeper, MD 02/03/23 2014

## 2023-05-11 ENCOUNTER — Ambulatory Visit (HOSPITAL_BASED_OUTPATIENT_CLINIC_OR_DEPARTMENT_OTHER): Payer: Medicare HMO | Admitting: Cardiovascular Disease

## 2023-05-11 NOTE — Progress Notes (Deleted)
Cardiology Office Note:  .   Date:  05/11/2023  ID:  Daniel Santos., DOB Jan 15, 1961, MRN [id number] PCP: Diamantina Providence, FNP  [facility] Providers Cardiologist:  None { Click to update primary MD,subspecialty MD or APP then REFRESH:1}   History of Present Illness: .   Daniel Santos. is a 62 y.o. male with diabetes, hypertension, hyperlipidemia, spinal stenosis, prior polysubstance abuse and prior stroke who is being seen today to reestablish care at the request of Diamantina Providence, FNP.  He was initially seen 12/2015 for an evaluation of palpitations.  His symptoms were mild and he elected not to undergo any ambulatory monitoring. At that time he did report some atypical chest pain and was referred for [facility]/06/16 that revealed LVEF 51% and no ischemia. He was started on Wellbutrin for smoking cessation. However he did not try the medication and has over a year's supply at home. He continues to smoke one pack of cigarettes daily.  He has been to medications in the past due to concerns with nausea.  He typically took his carvedilol once per day instead of twice per day and he reported fatigue. This was switched to nebivolol.  He was also nonadherent to atorvastatin.  He was admitted 03/2017 with an acute stroke of the left dorsal pons. He presented with visual changes and difficulty focusing his vision.  Echo revealed LVEF 60-65%. Carotid Dopplers showed 1-39% ICA stenosis bilaterally. Nebivolol was switched back to carvedilol at the patient's request.    He presents today provide no chest pain.  He was seen in the ED 01/2023 after arriving via EMS with chest pain.  Blood pressure was 155/83.  EKG was nonacute.  He was found to have a rash that was thought to be due to herpes zoster at that this was the etiology of his chest pain.  ROS: ***  Studies Reviewed: .        *** Risk Assessment/Calculations:   {Does this patient have ATRIAL FIBRILLATION?[phone]} No BP  recorded.  {Refresh Note OR Click here to enter BP  :1}***       Physical Exam:   VS:  There were no vitals taken for this visit.   Wt Readings from Last 3 Encounters:  02/03/23 142 lb (64.4 kg)  07/08/19 132 lb 9.6 oz (60.1 kg)  05/27/19 134 lb 8 oz (61 kg)    GEN: Well nourished, well developed in no acute distress NECK: No JVD; No carotid bruits CARDIAC: ***RRR, no murmurs, rubs, gallops RESPIRATORY:  Clear to auscultation without rales, wheezing or rhonchi  ABDOMEN: Soft, non-tender, non-distended EXTREMITIES:  No edema; No deformity   ASSESSMENT AND PLAN: .   ***    {Are you ordering a CV Procedure (e.g. stress test, cath, DCCV, TEE, etc)?   Press F2        :[id number]}  Dispo: ***  Signed, Chilton Si, MD

## 2024-04-21 NOTE — Progress Notes (Unsigned)
 Cardiology Office Note Date:  04/21/2024  ID:  Daniel DELENA Yvone Mickey., DOB 07-07-1961, MRN [id number] PCP:  Lenon Nell SAILOR, FNP  Cardiologist:  Joelle VEAR Ren Donley, MD  No chief complaint on file.     Problems CVA on ASA 81 mg S/p renal Tx; has LUE fistula on [id number] TTE 8/18 60-65% and mod TR Uncontrolled IDDM A1C 9.6 8/25 in setting of pancreatic insufficiency Former smoker 80 pack year HLD LDL 74 5/25 HTN on CL12.5BID Pulmonary HTN  Visits  09/17:    History of Present Illness: Daniel Closser. is a 63 y.o. male who presents for fatigue and DOE.  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 He initially attributed it to post op but it has gotten worse over the year. He has DOE w/ minimal walking and taking a shower. He has had trouble with leaving his place because he is too tired. He denies any CP, orthopnea, PND or LE edema. He reports adherence to his medication  ROS: Please see the history of present illness. All other systems are reviewed and negative.   Past Medical History:  Diagnosis Date   Anemia    Atypical chest pain 12/14/2015   CKD (chronic kidney disease), stage IV (HCC) 03/23/2017   Clotting disorder (HCC)    Diabetes mellitus without complication (HCC)    Type I   Gastritis    Hemiparesis and alteration of sensations as late effects of stroke (HCC) 03/25/2015   History of non-insulin  dependent diabetes mellitus    Hypercholesteremia    Hypertension    Left arm weakness    Palpitations 12/14/2015   [facility]roke [facility])     Past Surgical History:  Procedure Laterality Date   AV FISTULA PLACEMENT     BASCILIC VEIN TRANSPOSITION Left 10/26/2017   Procedure: BASILIC VEIN TRANSPOSITION FIRST STAGE LEFT;  Surgeon: Oris Krystal FALCON, MD;  Location: [facility] OR;  Service: Vascular;  Laterality: Left;   BASCILIC VEIN TRANSPOSITION Left 01/18/2018   Procedure: SECOND STAGEBASILIC VEIN TRANSPOSITION  LEFT ARM;  Surgeon: Oris Krystal FALCON, MD;  Location: [facility] OR;  Service: Vascular;  Laterality: Left;   COLONOSCOPY     DIRECT LARYNGOSCOPY N/A 09/24/2018   Procedure: DIRECT LARYNGOSCOPY WITH BIOPSY,BRONCHOSOPY;  Surgeon: Terri Alan PARAS, MD;  Location: [facility] OR;  Service: ENT;  Laterality: N/A;   DRAINAGE AND CLOSURE OF LYMPHOCELE Left 11/21/2017   Procedure: DRAINAGE AND CLOSURE OF LYMPHOCELE OF LEFT UPPER ARM;  Surgeon: Harvey Carlin BRAVO, MD;  Location: [facility];  Service: Vascular;  Laterality: Left;   ESOPHAGOGASTRODUODENOSCOPY     LUMBAR SPINE SURGERY  2005, 2008, 2010   REPAIR OF FRACTURED PENIS  05/2000   with cystoscopy    Current Outpatient Medications  Medication Sig Dispense Refill   amLODipine  (NORVASC ) 10 MG tablet Take 10 mg by mouth at bedtime.      aspirin  EC 325 MG EC tablet Take 1 tablet (325 mg total) by mouth daily. 30 tablet 4   atorvastatin  (LIPITOR) 40 MG tablet Take 1 tablet (40 mg total) every evening by mouth. 30 tablet 2   BUTRANS  20 MCG/HR PTWK patch Place 20 mcg onto the skin every Monday.      calcitRIOL  (ROCALTROL ) 0.25 MCG capsule Take 0.25 mcg by mouth daily.      carvedilol  (COREG ) 25 MG tablet Take 1 tablet (25 mg total) by mouth 2 (two) times daily with a  meal. 60 tablet 11   clopidogrel  (PLAVIX ) 75 MG tablet Take 1 tablet (75 mg total) by mouth daily. 30 tablet 0   CONTOUR NEXT TEST test strip      ferrous sulfate  325 (65 FE) MG tablet Take 325 mg by mouth at bedtime.      Furosemide (LASIX PO) Take 160 mg by mouth 2 (two) times a day.      insulin  lispro (HUMALOG) 100 UNIT/ML injection See admin instructions. Uses via insulin  pump per bolus rate     LINZESS  145 MCG CAPS capsule Take 145 mcg by mouth daily as needed (for constipation).      oxyCODONE -acetaminophen  (PERCOCET/ROXICET) 5-325 MG tablet Take 1 tablet by mouth every 6 (six) hours as needed for up to 15 doses for severe pain. 15 tablet 0   pantoprazole  (PROTONIX ) 20 MG tablet Take 1 tablet (20 mg  total) by mouth daily before breakfast. 90 tablet 3   No current facility-administered medications for this visit.    Allergies:   Doxycycline   Social History:  80 pack year  Family History:  Noncontributory  PHYSICAL EXAM: VS:  There were no vitals taken for this visit. , BMI There is no height or weight on file to calculate BMI. GEN: Well nourished, well developed, in no acute distress HEENT: normal Neck: no JVD, carotid bruits, or masses Cardiac: RRR; no murmurs, rubs, or gallops,no edema  Respiratory:  CTAB bilaterally, normal work of breathing GI: soft, nontender, nondistended, + BS Extremities: No LE edema Skin: warm and dry, no rash Neuro:  Strength and sensation are intact  EKG: NSR  Recent Labs: No results found for requested labs within last 365 days.      Component Value Date/Time   CHOL 98 03/23/2017 0559   TRIG 105 03/23/2017 0559   HDL 40 (L) 03/23/2017 0559   CHOLHDL 2.5 03/23/2017 0559   VLDL 21 03/23/2017 0559   LDLCALC 37 03/23/2017 0559     Wt Readings from Last 5 Encounters:  02/03/23 142 lb (64.4 kg)  07/08/19 132 lb 9.6 oz (60.1 kg)  05/27/19 134 lb 8 oz (61 kg)  01/31/19 134 lb (60.8 kg)  01/17/19 140 lb (63.5 kg)     BP Readings from Last 5 Encounters:  02/03/23 (!) 155/83  07/08/19 130/64  05/27/19 122/64  01/31/19 122/65  09/24/18 119/75    Studies: Reviewed  ASSESSMENT AND PLAN: Daniel DELENA Yvone Mickey. is a 63 y.o. male who presents for fatigue and DOE.  #DOE #CVA x 3 (last one in 2022) #CAD in LM on CT #Moderate TR w/ pulmonary HTN #80-pack year smoking Hx - Presenting w/ 3-4 years of worsening DOE and fatigue. Exam and ECG unremarkable. Recent labs w/ normal CBC, CMP and TSH. Ddx for his presentation includes worsening PH, obstructive CAD, COPD, and lung malignancy. Will obtain TTE and coronary CTA given known CAD seen on CT chest and refer to pulm for further dyspnea evaluation given significant tobacco Hx and emphysema seen on  CT. - D/c plavix  given that CVA was 3 years ago and continue ASA 81 monotherapy.  - Will do AAA screening during next visit - Nephrology increased his coreg  to 12.5 mg BID for BP a few weeks and will continue to manage BP - Follow up in 3 months  Signed, Joelle VEAR Ren Donley, MD  04/21/2024 1:38 PM    [facility]

## 2024-04-23 ENCOUNTER — Ambulatory Visit: Attending: Cardiology

## 2024-04-23 ENCOUNTER — Other Ambulatory Visit (HOSPITAL_COMMUNITY): Payer: Self-pay

## 2024-04-23 VITALS — BP 142/68 | HR 79 | Ht 69.0 in | Wt 152.2 lb

## 2024-04-23 DIAGNOSIS — I639 Cerebral infarction, unspecified: Secondary | ICD-10-CM

## 2024-04-23 DIAGNOSIS — R0602 Shortness of breath: Secondary | ICD-10-CM | POA: Diagnosis not present

## 2024-04-23 DIAGNOSIS — I10 Essential (primary) hypertension: Secondary | ICD-10-CM | POA: Diagnosis not present

## 2024-04-23 DIAGNOSIS — Z87891 Personal history of nicotine dependence: Secondary | ICD-10-CM

## 2024-04-23 DIAGNOSIS — I272 Pulmonary hypertension, unspecified: Secondary | ICD-10-CM

## 2024-04-23 DIAGNOSIS — I2510 Atherosclerotic heart disease of native coronary artery without angina pectoris: Secondary | ICD-10-CM

## 2024-04-23 DIAGNOSIS — I071 Rheumatic tricuspid insufficiency: Secondary | ICD-10-CM

## 2024-04-23 DIAGNOSIS — E782 Mixed hyperlipidemia: Secondary | ICD-10-CM | POA: Diagnosis not present

## 2024-04-23 DIAGNOSIS — E119 Type 2 diabetes mellitus without complications: Secondary | ICD-10-CM

## 2024-04-23 MED ORDER — ASPIRIN 81 MG PO TBEC: 81.0000 mg | DELAYED_RELEASE_TABLET | Freq: Every day | ORAL | Status: AC

## 2024-04-23 MED ORDER — CARVEDILOL 12.5 MG PO TABS: 12.5000 mg | ORAL_TABLET | Freq: Two times a day (BID) | ORAL | 2 refills | Status: DC

## 2024-04-23 MED ORDER — METOPROLOL TARTRATE 100 MG PO TABS
100.0000 mg | ORAL_TABLET | Freq: Once | ORAL | 0 refills | Status: DC
  Filled 2024-04-23: qty 1, 1d supply, fill #0

## 2024-04-23 NOTE — Patient Instructions (Addendum)
 Medication Instructions:  Your physician has recommended you make the following change in your medication: STOP Plavix  STOP Aspirin  325 mg START Aspirin  81 mg once daily  *If you need a refill on your cardiac medications before your next appointment, please call your pharmacy*  Lab Work: None ordered  If you have any lab test that is abnormal or we need to change your treatment, we will call you to review the results.  Testing/Procedures: Your physician has requested that you have an echocardiogram. Echocardiography is a painless test that uses sound waves to create images of your heart. It provides your doctor with information about the size and shape of your heart and how well your heart's chambers and valves are working. This procedure takes approximately one hour. There are no restrictions for this procedure. Please do NOT wear cologne, perfume, aftershave, or lotions (deodorant is allowed). Please arrive 15 minutes prior to your appointment time.  Please note: We ask at that you not bring children with you during ultrasound (echo/ vascular) testing. Due to room size and safety concerns, children are not allowed in the ultrasound rooms during exams. Our front office staff cannot provide observation of children in our lobby area while testing is being conducted. An adult accompanying a patient to their appointment will only be allowed in the ultrasound room at the discretion of the ultrasound technician under special circumstances. We apologize for any inconvenience.   Your physician has requested that you have cardiac CT. Cardiac computed tomography (CT) is a painless test that uses an x-ray machine to take clear, detailed pictures of your heart. For further information please visit [email or web address] Please follow instruction sheet as given.    Follow-Up: At [facility], you and your health needs are our priority.  As part of our continuing mission to provide you with  exceptional heart care, our providers are all part of one team.  This team includes your primary Cardiologist (physician) and Advanced Practice Providers or APPs (Physician Assistants and Nurse Practitioners) who all work together to provide you with the care you need, when you need it.   You have been referred to pulmonology   Your next appointment:   3 month(s)  Provider:   Dr. Ren    Thank you for choosing [facility]!!    [phone]   Other Instructions    Your cardiac CT will be scheduled at one of the below locations:   [facility] [address] [place], [place] [address] [phone] (Severe contrast allergies only)  OR   [facility] [address] [place], [place] [address] [phone]  OR   [facility] [address] [place], [place] [address] [phone]  OR   [facility] and Vascular Tower [address]  [place], [place] [address] [phone]  OR   [facility] [address] [place], [place] [phone]  If scheduled at [facility], please arrive at the [facility] and Children's Entrance (Entrance C2) of [facility] 30 minutes prior to test start time. You can use the FREE valet parking offered at entrance C (encouraged to control the heart rate for the test)  Proceed to the [facility] Radiology Department (first floor) to check-in and test prep.  All radiology patients and guests should use entrance C2 at [facility], accessed from [facility], even though the hospital's physical address listed is [address]  If scheduled at  the Heart and Vascular Tower at [address], please enter the parking lot using the [address] entrance and use the FREE valet service at the patient drop-off area. Enter the building and check-in with registration on the main floor.  If scheduled at [facility], please arrive to the Heart and Vascular Center 15 mins early for check-in and test prep.  There is spacious parking and easy access to the radiology department from the [facility] Heart and Vascular entrance. Please enter here and check-in with the desk attendant.   If scheduled at [facility], please arrive 30 minutes early for check-in and test prep.  Please follow these instructions carefully (unless otherwise directed):  An IV will be required for this test and Nitroglycerin will be given.  Hold all erectile dysfunction medications at least 3 days (72 hrs) prior to test. (Ie viagra, cialis, sildenafil, tadalafil, etc)   On the Night Before the Test: Be sure to Drink plenty of water. Do not consume any caffeinated/decaffeinated beverages or chocolate 12 hours prior to your test. Do not take any antihistamines 12 hours prior to your test.  On the Day of the Test: Drink plenty of water until 1 hour prior to the test. Do not eat any food 1 hour prior to test. You may take your regular medications prior to the test.  Take metoprolol  (Lopressor ) 100 mg two hours prior to test.  (DO NOT TAKE YOUR CARVEDILOL  ON THIS DAY) If you take Furosemide/Hydrochlorothiazide/Spironolactone/Chlorthalidone, please HOLD on the morning of the test. Patients who wear a continuous glucose monitor MUST remove the device prior to scanning      After the Test: Drink plenty of water. After receiving IV contrast, you may experience a mild flushed feeling. This is normal. On occasion, you may experience a mild rash up to 24 hours after the test. This is not dangerous. If this occurs, you can take Benadryl 25 mg, Zyrtec, Claritin, or Allegra and increase your fluid intake. (Patients taking Tikosyn should avoid Benadryl, and may take Zyrtec, Claritin, or Allegra) If you experience trouble breathing, this can be serious. If it is severe call [phone] IMMEDIATELY. If it is mild, please call our office.  We will  call to schedule your test 2-4 weeks out understanding that some insurance companies will need an authorization prior to the service being performed.   For more information and frequently asked questions, please visit our website : [email or web address]  For non-scheduling related questions, please contact the cardiac imaging nurse navigator should you have any questions/concerns: Cardiac Imaging Nurse Navigators Direct Office Dial: [phone]   For scheduling needs, including cancellations and rescheduling, please call [place], [phone].

## 2024-04-24 ENCOUNTER — Telehealth: Payer: Self-pay

## 2024-04-24 NOTE — Telephone Encounter (Signed)
 Spoke with pt regarding his medication list. Pt stated he has been taking atorvastatin  since 2018 and wanted to make sure it was on his med list. Pt was told that it is on his medication list. Pt also takes Thiamine  100 mg once daily prescribed by his PCP. Pt would like this medication added to his list. Pt medication list update. Pt verbalized understanding. All questions if any were answered.

## 2024-04-24 NOTE — Telephone Encounter (Signed)
 Pt c/o medication issue:  1. Name of Medication: Thiamine  100 mg   Atorvastatin  40 mg  2. How are you currently taking this medication (dosage and times per day)? Take 1 tablet daily  3. Are you having a reaction (difficulty breathing--STAT)? No  4. What is your medication issue? Pt wanted to make sure his medication list has been updated per his conversation with doctor during appt yesterday. Thiamine  100 mg was recommended by Dr. Ren Ny and has not been added to current medication, and pt has been actively taking atorvistatin since 2018 and is still taking it.   Pt would like a prescription of these medications sent to [facility] pharmacy once they have been approved by doctor and updated on pt's chart.

## 2024-04-25 ENCOUNTER — Encounter (HOSPITAL_COMMUNITY): Payer: Self-pay

## 2024-04-29 ENCOUNTER — Ambulatory Visit (HOSPITAL_COMMUNITY)
Admission: RE | Admit: 2024-04-29 | Discharge: 2024-04-29 | Disposition: A | Discharge status: Home / Self Care | Source: Ambulatory Visit

## 2024-04-29 DIAGNOSIS — I517 Cardiomegaly: Secondary | ICD-10-CM | POA: Diagnosis not present

## 2024-04-29 DIAGNOSIS — I3139 Other pericardial effusion (noninflammatory): Secondary | ICD-10-CM | POA: Diagnosis not present

## 2024-04-29 DIAGNOSIS — I2584 Coronary atherosclerosis due to calcified coronary lesion: Secondary | ICD-10-CM | POA: Insufficient documentation

## 2024-04-29 DIAGNOSIS — Q2112 Patent foramen ovale: Secondary | ICD-10-CM | POA: Insufficient documentation

## 2024-04-29 DIAGNOSIS — R0602 Shortness of breath: Secondary | ICD-10-CM | POA: Diagnosis not present

## 2024-04-29 DIAGNOSIS — Z136 Encounter for screening for cardiovascular disorders: Secondary | ICD-10-CM | POA: Insufficient documentation

## 2024-04-29 DIAGNOSIS — I2510 Atherosclerotic heart disease of native coronary artery without angina pectoris: Secondary | ICD-10-CM | POA: Diagnosis not present

## 2024-04-29 LAB — POCT I-STAT CREATININE: Creatinine, Ser: 1.4 mg/dL — ABNORMAL HIGH (ref 0.61–1.24)

## 2024-04-29 MED ORDER — NITROGLYCERIN 0.4 MG SL SUBL
0.8000 mg | SUBLINGUAL_TABLET | Freq: Once | SUBLINGUAL | Status: AC
Start: 2024-04-29 — End: 2024-04-29
  Administered 2024-04-29: 0.8 mg via SUBLINGUAL

## 2024-04-29 MED ORDER — IOHEXOL 350 MG/ML SOLN
100.0000 mL | Freq: Once | INTRAVENOUS | Status: AC | PRN
  Administered 2024-04-29: 95 mL via INTRAVENOUS

## 2024-05-01 ENCOUNTER — Ambulatory Visit: Payer: Self-pay

## 2024-05-01 DIAGNOSIS — M7989 Other specified soft tissue disorders: Secondary | ICD-10-CM

## 2024-05-01 NOTE — Telephone Encounter (Signed)
 I called patient to discuss his cardiac CT results. I explained that he had non-obstructive CAD and thus unlikely that it was causing his symptoms and severely dilated RA/dilated PA suggested likely pulmonary etiology of his symptoms. He reported new onset RLE edema and pain for last 4 days and I ordered DVT ultrasound. We will follow up with TTE and pulm eval before deciding next steps.  Joelle DEL. Ren Ny, MD [facility]

## 2024-05-02 ENCOUNTER — Other Ambulatory Visit (HOSPITAL_COMMUNITY): Payer: Self-pay

## 2024-05-02 ENCOUNTER — Ambulatory Visit (HOSPITAL_COMMUNITY)
Admission: RE | Admit: 2024-05-02 | Discharge: 2024-05-02 | Disposition: A | Discharge status: Home / Self Care | Source: Ambulatory Visit

## 2024-05-02 ENCOUNTER — Ambulatory Visit: Admitting: Pharmacist

## 2024-05-02 VITALS — BP 152/69 | HR 73

## 2024-05-02 DIAGNOSIS — M7989 Other specified soft tissue disorders: Secondary | ICD-10-CM | POA: Insufficient documentation

## 2024-05-02 DIAGNOSIS — I82411 Acute embolism and thrombosis of right femoral vein: Secondary | ICD-10-CM | POA: Diagnosis present

## 2024-05-02 MED ORDER — APIXABAN 5 MG PO TABS: 5.0000 mg | ORAL_TABLET | Freq: Two times a day (BID) | ORAL | 1 refills | Status: DC

## 2024-05-02 MED ORDER — ELIQUIS DVT/PE STARTER PACK 5 MG PO TBPK
ORAL_TABLET | ORAL | 0 refills | Status: DC
  Filled 2024-05-02: qty 74, 30d supply, fill #0

## 2024-05-02 NOTE — Progress Notes (Signed)
 DVT Clinic Note  Name: Daniel Santos.     MRN: [id number]     DOB: [birth year]     Sex: male  PCP: Lenon Nell SAILOR, FNP  Today's Visit: Visit Information: Initial Visit  Referred to DVT Clinic by: Cardiology - Joelle Ren Ny, MD Referred to CPP by: Dr. Gretta Reason for referral:  Chief Complaint  Patient presents with   DVT   HISTORY OF PRESENT ILLNESS: Daniel Santos. is a 63 y.o. male with PMH kidney/pancreas transplant in 2021, CVA, CAD, HTN, DM, CKD, HLD, previous tobacco use who presents after diagnosis of DVT for medication management. Patient had cardiac CT on 04/29/24. When cardiology called to discuss results patient reported new onset RLE pain and swelling for the last 4 days. Ultrasound was ordered to rule out DVT. Ultrasound completed today showed acute DVT involving the right distal common femoral and femoral vein as well as superficial vein thrombosis in the right great saphenous vein. External iliac vein not evaluated. Referred to DVT Clinic to start treatment. Patient reports pain and swelling in his right leg primarily below the knee that started about 5 days ago. He has chronic SOB, but denies any recent worsening SOB. Denies chest pain. Denies history of DVT. Endorses a sedentary lifestyle, but this is not a recent change. Reports since his transplant in 2021 he has had a sedentary lifestyle. States he is overdue for colonoscopy.   Positive Thrombotic Risk Factors: Older Age Bleeding Risk Factors: Antiplatelet therapy  Negative Thrombotic Risk Factors: Previous VTE, Recent surgery (within 3 months), Recent trauma (within 3 months), Recent admission to hospital with acute illness (within 3 months), Paralysis, paresis, or recent plaster cast immobilization of lower extremity, Central venous catheterization, Pregnancy, Sedentary journey lasting >8 hours within 4 weeks, Bed rest >72 hours within 3 months, Within 6 weeks postpartum, Recent cesarean section (within 3  months), Estrogen therapy, Recent COVID diagnosis (within 3 months), Non-malignant, chronic inflammatory condition, Erythropoiesis-stimulating agent, Testosterone therapy, Active cancer, Known thrombophilic condition, Smoking, Obesity  Rx Insurance Coverage: Medicare Rx Affordability: Eliquis  is $47 per 1 month supply Rx Assistance Provided: N/A Preferred Pharmacy: Eliquis  starter pack filled at [facility] during appointment today. Refills sent to [facility] pharmacy per patient preference.  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) 03/25/2015   History of non-insulin  dependent diabetes mellitus    Hypercholesteremia    Hypertension    Left arm weakness    Palpitations 12/14/2015   Stroke [facility])     Past Surgical History:  Procedure Laterality Date   AV FISTULA PLACEMENT     BASCILIC VEIN TRANSPOSITION Left 10/26/2017   Procedure: BASILIC VEIN TRANSPOSITION FIRST STAGE LEFT;  Surgeon: Oris Krystal FALCON, MD;  Location: [facility] OR;  Service: Vascular;  Laterality: Left;   BASCILIC VEIN TRANSPOSITION Left 01/18/2018   Procedure: SECOND STAGEBASILIC VEIN TRANSPOSITION LEFT ARM;  Surgeon: Oris Krystal FALCON, MD;  Location: [facility] OR;  Service: Vascular;  Laterality: Left;   COLONOSCOPY     DIRECT LARYNGOSCOPY N/A 09/24/2018   Procedure: DIRECT LARYNGOSCOPY WITH BIOPSY,BRONCHOSOPY;  Surgeon: Terri Alan PARAS, MD;  Location: [facility] OR;  Service: ENT;  Laterality: N/A;   DRAINAGE  AND CLOSURE OF LYMPHOCELE Left 11/21/2017   Procedure: DRAINAGE AND CLOSURE OF LYMPHOCELE OF LEFT UPPER ARM;  Surgeon: Harvey Carlin BRAVO, MD;  Location: [facility] OR;  Service: Vascular;  Laterality: Left;   ESOPHAGOGASTRODUODENOSCOPY     LUMBAR SPINE SURGERY  2005, 2008, 2010    REPAIR OF FRACTURED PENIS  05/2000   with cystoscopy    Social History   Socioeconomic History   Marital status: Single    Spouse name: Not on file   Number of children: 3   Years of education: 16   Highest education level: Not on file  Occupational History   Not on file  Tobacco Use   Smoking status: Former    Current packs/day: 0.00    Average packs/day: 1 pack/day for 40.0 years (40.0 ttl pk-yrs)    Types: Cigarettes    Start date: 06/06/1977    Quit date: 06/06/2017    Years since quitting: 6.9   Smokeless tobacco: Never  Vaping Use   Vaping status: Never Used  Substance and Sexual Activity   Alcohol use: Not Currently    Comment: 2 X A YEAR   Drug use: No   Sexual activity: Not on file  Other Topics Concern   Not on file  Social History Narrative   Patient drinks about 2 cups of caffeine daily.   Patient is right handed.   Social Drivers of Corporate investment banker Strain: Not on file  Food Insecurity: Not on file  Transportation Needs: Not on file  Physical Activity: Not on file  Stress: Not on file  Social Connections: Not on file  Intimate Partner Violence: Not on file    Family History  Problem Relation Age of Onset   Cancer Mother    Leukemia Paternal Uncle    Stroke Paternal Grandmother    Diabetes Paternal Grandmother    Breast cancer Paternal Grandmother    Kidney disease Father    Diabetes Paternal Aunt        x2   Breast cancer Maternal Grandmother    Colon cancer Neg Hx    Esophageal cancer Neg Hx    Stomach cancer Neg Hx    Rectal cancer Neg Hx    Colon polyps Neg Hx     Allergies as of 05/02/2024 - Reviewed 04/29/2024  Allergen Reaction Noted   Doxycycline Nausea Only and Other (See Comments) 06/10/2012    Current Outpatient Medications on File Prior to Visit  Medication Sig Dispense Refill   acetaminophen  (TYLENOL ) 500 MG tablet Take 1,000 mg by mouth every 6 (six) hours as needed (pain).     aspirin  EC 81 MG tablet Take 1  tablet (81 mg total) by mouth daily. Swallow whole.     atorvastatin  (LIPITOR) 40 MG tablet Take 1 tablet (40 mg total) every evening by mouth. 30 tablet 2   azathioprine (IMURAN) 100 MG tablet Take 100 mg by mouth daily.     BUTRANS  20 MCG/HR PTWK patch Place 20 mcg onto the skin every Monday.      carvedilol  (COREG ) 12.5 MG tablet Take 1 tablet (12.5 mg total) by mouth 2 (two) times daily with a meal. 90 tablet 2   cycloSPORINE modified (NEORAL) 100 MG capsule Take 100 mg by mouth 2 (two) times daily.     insulin  lispro (HUMALOG) 100 UNIT/ML injection See admin instructions. Uses via insulin  pump per bolus rate     LINZESS  145 MCG CAPS capsule Take 145 mcg by mouth daily  as needed (for constipation).      tamsulosin (FLOMAX) 0.4 MG CAPS capsule Take 0.4 mg by mouth 2 (two) times daily.     thiamine  (VITAMIN B1) 100 MG tablet Take 100 mg by mouth daily.     CONTOUR NEXT TEST test strip      No current facility-administered medications on file prior to visit.   REVIEW OF SYSTEMS:  Review of Systems  Respiratory:  Negative for shortness of breath.   Cardiovascular:  Positive for leg swelling. Negative for chest pain.  Musculoskeletal:  Positive for myalgias.   PHYSICAL EXAMINATION:  Vitals:   05/02/24 1706  BP: (!) 152/69  Pulse: 73  SpO2: 93%    There is no height or weight on file to calculate BMI.  Physical Exam Vitals reviewed.  Pulmonary:     Effort: Pulmonary effort is normal.  Musculoskeletal:        General: Tenderness present.     Right lower leg: Edema present.  Neurological:     Mental Status: He is alert.  Psychiatric:        Mood and Affect: Mood normal.    Villalta Score for Post-Thrombotic Syndrome: Pain: Mild Cramps: Absent Heaviness: Absent Paresthesia: Absent Pruritus: Absent Pretibial Edema: Moderate Skin Induration: Absent Hyperpigmentation: Absent Redness: Absent Venous Ectasia: Absent Pain on calf compression: Absent Villalta Preliminary  Score: 3 Is venous ulcer present?: No If venous ulcer is present and score is <15, then 15 points total are assigned: Absent Villalta Total Score: 3  LABS:  CBC     Component Value Date/Time   WBC 5.7 02/03/2023 0738   RBC 5.26 02/03/2023 0738   HGB 13.3 02/03/2023 0738   HCT 43.9 02/03/2023 0738   PLT 201 02/03/2023 0738   MCV 83.5 02/03/2023 0738   MCH 25.3 (L) 02/03/2023 0738   MCHC 30.3 02/03/2023 0738   RDW 15.9 (H) 02/03/2023 0738   LYMPHSABS 0.4 (L) 02/03/2023 0738   MONOABS 0.8 02/03/2023 0738   EOSABS 0.1 02/03/2023 0738   BASOSABS 0.0 02/03/2023 0738    Hepatic Function      Component Value Date/Time   PROT 7.3 06/09/2017 0545   ALBUMIN 3.8 06/09/2017 0545   AST 22 06/09/2017 0545   ALT 15 (L) 06/09/2017 0545   ALKPHOS 111 06/09/2017 0545   BILITOT 0.6 06/09/2017 0545    Renal Function   Lab Results  Component Value Date   CREATININE 1.40 (H) 04/29/2024   CREATININE 1.18 02/03/2023   CREATININE 4.30 (H) 09/17/2018    Estimated Creatinine Clearance: 52 mL/min (A) (by C-G formula based on SCr of 1.4 mg/dL (H)).   [facility] Vascular Lab Studies:  05/02/24 VAS US  LOWER EXTREMITY VENOUS (DVT)RIGHT: Summary:  RIGHT:  - Findings consistent with acute deep vein thrombosis involving the distal  common femoral vein and right femoral vein proximal segment to the distal  segment,.  - Findings consistent with acute superficial vein thrombosis involving the  right great saphenous vein in the proximal segment with extention on  distally, age undetermined.   - Finding consistent with small saphenour thrombosis, age undeternined.   ASSESSMENT: Location of DVT: Right common femoral vein, Right femoral vein, Right superficial vein Cause of DVT: unprovoked  Patient without prior history of DVT diagnosed with acute DVT in the right distal common femoral vein and femoral vein as well as superficial vein thrombosis in the right great saphenous vein. External iliac vein was not  evaluated. Given DVT extends to the common femoral  vein, discussed the patient with vascular surgeon. Per Dr. Gretta, he is not a candidate for vascular surgery intervention given his comorbidities and swelling mainly below the knee. Indicated to start on anticoagulation. Will start on Eliquis  VTE starter pack. No concerns on labs for Eliquis . Extensively counseled on Eliquis  and all patient questions were answered. No medication adherence barriers identified. No clear strong provoking factors for DVT identified. He does have a sedentary lifestyle, but this is not a recent change for him. Will refer him to hematology for further work up and anticoagulation management. Counseled him to elevate his leg and use compression to help with swelling and he confirmed understanding. Stated he already has compression stockings at home.  PLAN: -Start apixaban  (Eliquis ) 10 mg twice daily for 7 days followed by 5 mg twice daily. -Expected duration of therapy: per hematology. Therapy started on 05/02/24. -Patient educated on purpose, proper use and potential adverse effects of apixaban  (Eliquis ). -Discussed importance of taking medication around the same time every day. -Advised patient of medications to avoid (NSAIDs, aspirin  doses >100 mg daily). -Educated that Tylenol  (acetaminophen ) is the preferred analgesic to lower the risk of bleeding. -Advised patient to alert all providers of anticoagulation therapy prior to starting a new medication or having a procedure. -Emphasized importance of monitoring for signs and symptoms of bleeding (abnormal bruising, prolonged bleeding, nose bleeds, bleeding from gums, discolored urine, black tarry stools). -Educated patient to present to the ED if emergent signs and symptoms of new thrombosis occur. -Counseled patient to wear compression stockings daily, removing at night.  Follow up: Referred to hematology  Izetta Henry, PharmD [facility] Clinical  Pharmacist [phone]

## 2024-05-02 NOTE — Patient Instructions (Signed)
-  Start apixaban  (Eliquis ) 10 mg twice daily for 7 days followed by 5 mg twice daily. -Your refills have been sent to [facility]. You may need to call the pharmacy to ask them to fill this when you start to run low on your current supply.  -It is important to take your medication around the same time every day.  -Avoid NSAIDs like ibuprofen (Advil, Motrin) and naproxen (Aleve) as well as aspirin  doses over 100 mg daily. -Tylenol  (acetaminophen ) is the preferred over the counter pain medication to lower the risk of bleeding. -Be sure to alert all of your health care providers that you are taking an anticoagulant prior to starting a new medication or having a procedure. -Monitor for signs and symptoms of bleeding (abnormal bruising, prolonged bleeding, nose bleeds, bleeding from gums, discolored urine, black tarry stools). If you have fallen and hit your head OR if your bleeding is severe or not stopping, seek emergency care.  -Go to the emergency room if emergent signs and symptoms of new clot occur (new or worse swelling and pain in an arm or leg, shortness of breath, chest pain, fast or irregular heartbeats, lightheadedness, dizziness, fainting, coughing up blood) or if you experience a significant color change (pale or blue) in the extremity that has the DVT.  -We recommend you wear compression stockings (20-30 mmHg) as long as you are having swelling or pain. Be sure to purchase the correct size and take them off at night.   If you have any questions or need to reschedule an appointment, please call [phone]. If you are having an emergency, call 911 or present to the nearest emergency room.   What is a DVT?  -Deep vein thrombosis (DVT) is a condition in which a blood clot forms in a vein of the deep venous system which can occur in the lower leg, thigh, pelvis, arm, or neck. This condition is serious and can be life-threatening if the clot travels to the arteries of the lungs and causing  a blockage (pulmonary embolism, PE). A DVT can also damage veins in the leg, which can lead to long-term venous disease, leg pain, swelling, discoloration, and ulcers or sores (post-thrombotic syndrome).  -Treatment may include taking an anticoagulant medication to prevent more clots from forming and the current clot from growing, wearing compression stockings, and/or surgical procedures to remove or dissolve the clot.

## 2024-05-28 ENCOUNTER — Ambulatory Visit (HOSPITAL_COMMUNITY)
Admission: RE | Admit: 2024-05-28 | Discharge: 2024-05-28 | Disposition: A | Discharge status: Home / Self Care | Source: Ambulatory Visit | Attending: Cardiology | Admitting: Cardiology

## 2024-05-28 DIAGNOSIS — R0602 Shortness of breath: Secondary | ICD-10-CM | POA: Insufficient documentation

## 2024-05-28 DIAGNOSIS — I071 Rheumatic tricuspid insufficiency: Secondary | ICD-10-CM | POA: Diagnosis present

## 2024-05-28 LAB — ECHOCARDIOGRAM COMPLETE
Area-P 1/2: 3.48 cm2
S' Lateral: 2.3 cm

## 2024-06-02 NOTE — Telephone Encounter (Signed)
 I called the patient to discuss the result of his echo.  He has normal left ventricular systolic function.  He does have moderate TR, which is known with associated elevated pulmonary artery systolic pressure.  I let him know that this is not life-threatening and that we will discuss next of during the next visit.  Joelle DEL. Ren Ny, MD [facility]

## 2024-06-17 ENCOUNTER — Inpatient Hospital Stay

## 2024-06-17 ENCOUNTER — Telehealth: Payer: Self-pay

## 2024-06-17 NOTE — Telephone Encounter (Signed)
 Called the patient he forgot his appointment with us  today states he has so many appointments some times he forgets one. I will ask scheduling to reschedule him after I speak with Dr Tina. Arland Legions BSN RN

## 2024-06-25 ENCOUNTER — Encounter: Admitting: Pulmonary Disease

## 2024-06-25 ENCOUNTER — Encounter: Payer: Self-pay | Admitting: Pulmonary Disease

## 2024-06-25 VITALS — BP 123/66 | HR 81 | Temp 97.7°F | Ht 69.0 in | Wt 149.0 lb

## 2024-06-25 DIAGNOSIS — R0609 Other forms of dyspnea: Secondary | ICD-10-CM

## 2024-06-25 NOTE — Progress Notes (Signed)
 @Patient  ID: Daniel DELENA Yvone Mickey., male    DOB: 29-Jan-1961, 63 y.o.   MRN: [id number]  Chief Complaint  Patient presents with   Consult   Shortness of Breath    Referral from cardiologist- fatigue and sob( any distance). 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    Referring provider: Ren Donley Joelle VEAR*  HPI:   63 y.o. man who were seen for evaluation of dyspnea on exertion.     Questionaires / Pulmonary Flowsheets:   ACT:      No data to display          MMRC:     No data to display          Epworth:      No data to display          Tests:   FENO:  No results found for: NITRICOXIDE  PFT:     No data to display          WALK:      No data to display          Imaging: Personally reviewed and as per EMR and discussion  ECHOCARDIOGRAM COMPLETE Result Date: 05/28/2024    ECHOCARDIOGRAM REPORT   Patient Name:   Daniel Wronski. Date of Exam: 05/28/2024 Medical Rec #:  [id number]         Height:       69.0 in Accession #:    [id number]        Weight:       152.2 lb Date of Birth:  Jun 03, 1961          BSA:          1.840 m Patient Age:    63 years          BP:           142/68 mmHg Patient Gender: M                 HR:           74 bpm. Exam Location:  [address] Procedure: 2D Echo, Cardiac Doppler, Color Doppler, 3D Echo and Strain Analysis            (Both Spectral and Color Flow Doppler were utilized during            procedure). Indications:    I25.10 CAD  History:        Patient has prior history of Echocardiogram examinations, most                 recent 03/23/2017. CAD, Stroke; Risk Factors:Hypertension,                 Dyslipidemia, Diabetes and Former Smoker.  Sonographer:    Elsie Bohr RDCS Referring Phys: [id number] [facility] H AZOBOU TONLEU IMPRESSIONS  1. Left ventricular ejection fraction, by estimation, is 55 to 60%. Left ventricular ejection fraction by 3D volume is 56 %. The left ventricle has normal function. The left ventricle  has no regional wall motion abnormalities. Left ventricular diastolic  parameters are indeterminate. The average left ventricular global longitudinal strain is -17.9 %. The global longitudinal strain is normal.  2. Right ventricular systolic function is normal. The right ventricular size is normal. There is severely elevated pulmonary artery systolic pressure.  3. The mitral valve is normal in structure. Mild mitral valve regurgitation. No evidence of mitral stenosis.  4. Tricuspid valve regurgitation is moderate.  5. The aortic valve is  tricuspid. Aortic valve regurgitation is not visualized. No aortic stenosis is present.  6. The inferior vena cava is normal in size with greater than 50% respiratory variability, suggesting right atrial pressure of 3 mmHg. FINDINGS  Left Ventricle: Left ventricular ejection fraction, by estimation, is 55 to 60%. Left ventricular ejection fraction by 3D volume is 56 %. The left ventricle has normal function. The left ventricle has no regional wall motion abnormalities. The average left ventricular global longitudinal strain is -17.9 %. Strain was performed and the global longitudinal strain is normal. The left ventricular internal cavity size was normal in size. There is no left ventricular hypertrophy. Left ventricular diastolic parameters are indeterminate. Normal left ventricular filling pressure. Right Ventricle: The right ventricular size is normal. No increase in right ventricular wall thickness. Right ventricular systolic function is normal. There is severely elevated pulmonary artery systolic pressure. The tricuspid regurgitant velocity is 3.80 m/s, and with an assumed right atrial pressure of 3 mmHg, the estimated right ventricular systolic pressure is 60.8 mmHg. Left Atrium: Left atrial size was normal in size. Right Atrium: Right atrial size was normal in size. Pericardium: There is no evidence of pericardial effusion. Mitral Valve: The mitral valve is normal in  structure. Mild mitral annular calcification. Mild mitral valve regurgitation. No evidence of mitral valve stenosis. Tricuspid Valve: The tricuspid valve is normal in structure. Tricuspid valve regurgitation is moderate . No evidence of tricuspid stenosis. Aortic Valve: The aortic valve is tricuspid. Aortic valve regurgitation is not visualized. No aortic stenosis is present. Pulmonic Valve: The pulmonic valve was normal in structure. Pulmonic valve regurgitation is mild. No evidence of pulmonic stenosis. Aorta: The aortic root is normal in size and structure. Venous: The inferior vena cava is normal in size with greater than 50% respiratory variability, suggesting right atrial pressure of 3 mmHg. IAS/Shunts: No atrial level shunt detected by color flow Doppler. Additional Comments: 3D was performed not requiring image post processing on an independent workstation and was normal.  LEFT VENTRICLE PLAX 2D LVIDd:         4.40 cm         Diastology LVIDs:         2.30 cm         LV e' medial:    8.92 cm/s LV PW:         1.00 cm         LV E/e' medial:  9.2 LV IVS:        1.26 cm         LV e' lateral:   9.46 cm/s LVOT diam:     2.00 cm         LV E/e' lateral: 8.6 LV SV:         87 LV SV Index:   47              2D Longitudinal LVOT Area:     3.14 cm        Strain LV IVRT:       100 msec        2D Strain GLS   -17.7 %                                (A4C):  2D Strain GLS   -17.3 %                                (A3C):                                2D Strain GLS   -18.5 %                                (A2C):                                2D Strain GLS   -17.9 %                                Avg:                                 3D Volume EF                                LV 3D EF:    Left                                             ventricul                                             ar                                             ejection                                              fraction                                             by 3D                                             volume is                                             56 %.  3D Volume EF:                                3D EF:        56 % RIGHT VENTRICLE             IVC RV S prime:     15.30 cm/s  IVC diam: 1.50 cm TAPSE (M-mode): 2.2 cm RVSP:           60.8 mmHg   PULMONARY VEINS                             Diastolic Velocity: 54.20 cm/s                             S/D Velocity:       1.30                             Systolic Velocity:  69.50 cm/s LEFT ATRIUM             Index        RIGHT ATRIUM           Index LA diam:        3.50 cm 1.90 cm/m   RA Pressure: 3.00 mmHg LA Vol (A2C):   53.0 ml 28.81 ml/m  RA Area:     17.80 cm LA Vol (A4C):   51.3 ml 27.89 ml/m  RA Volume:   50.20 ml  27.29 ml/m LA Biplane Vol: 53.1 ml 28.87 ml/m  AORTIC VALVE LVOT Vmax:   130.00 cm/s LVOT Vmean:  81.800 cm/s LVOT VTI:    0.276 m  AORTA Ao Root diam: 3.30 cm Ao Asc diam:  3.30 cm MITRAL VALVE               TRICUSPID VALVE MV Area (PHT): 3.48 cm    TR Peak grad:   57.8 mmHg MV Decel Time: 218 msec    TR Vmax:        380.00 cm/s MV E velocity: 81.80 cm/s  Estimated RAP:  3.00 mmHg MV A velocity: 78.10 cm/s  RVSP:           60.8 mmHg MV E/A ratio:  1.05                            SHUNTS                            Systemic VTI:  0.28 m                            Systemic Diam: 2.00 cm Annabella Scarce MD Electronically signed by Annabella Scarce MD Signature Date/Time: 05/28/2024/4:35:16 PM    Final     Lab Results: Personally reviewed CBC    Component Value Date/Time   WBC 5.7 02/03/2023 0738   RBC 5.26 02/03/2023 0738   HGB 13.3 02/03/2023 0738   HCT 43.9 02/03/2023 0738   PLT 201 02/03/2023 0738   MCV 83.5 02/03/2023 0738   MCH 25.3 (L) 02/03/2023 0738   MCHC 30.3 02/03/2023 0738   RDW 15.9 (H) 02/03/2023 0738   LYMPHSABS 0.4 (L)  02/03/2023 0738   MONOABS 0.8 02/03/2023 0738   EOSABS 0.1  02/03/2023 0738   BASOSABS 0.0 02/03/2023 0738    BMET    Component Value Date/Time   NA 140 02/03/2023 0738   K 3.9 02/03/2023 0738   CL 105 02/03/2023 0738   CO2 23 02/03/2023 0738   GLUCOSE 142 (H) 02/03/2023 0738   BUN 19 02/03/2023 0738   CREATININE 1.40 (H) 04/29/2024 1456   CREATININE 2.90 (H) 12/20/2015 1046   CALCIUM  8.9 02/03/2023 0738   GFRNONAA >60 02/03/2023 0738   GFRAA 16 (L) 09/17/2018 1011    BNP No results found for: BNP  ProBNP No results found for: PROBNP  Specialty Problems   None   Allergies  Allergen Reactions   Doxycycline Nausea Only and Other (See Comments)    GI upset, kidney function    Immunization History  Administered Date(s) Administered   Moderna Covid-19 Fall Seasonal Vaccine 31yrs & older 06/06/2022   Moderna Sars-Covid-2 Vaccination 09/29/2019, 10/28/2019, 07/08/2020   Pfizer Covid-19 Vaccine Bivalent Booster 59yrs & up 06/16/2021   Pneumococcal Polysaccharide-23 06/10/2017    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) 03/25/2015   History of non-insulin  dependent diabetes mellitus    Hypercholesteremia    Hypertension    Left arm weakness    Palpitations 12/14/2015   Stroke (HCC)     Tobacco History: Social History   Tobacco Use  Smoking Status Former   Current packs/day: 0.00   Average packs/day: 1 pack/day for 40.0 years (40.0 ttl pk-yrs)   Types: Cigarettes   Start date: 06/06/1977   Quit date: 06/06/2017   Years since quitting: 7.0  Smokeless Tobacco Never   Counseling given: Not Answered   Continue to not smoke  Outpatient Encounter Medications as of 06/25/2024  Medication Sig   acetaminophen  (TYLENOL ) 500 MG tablet Take 1,000 mg by mouth  every 6 (six) hours as needed (pain).   apixaban  (ELIQUIS ) 5 MG TABS tablet Take 1 tablet (5 mg total) by mouth 2 (two) times daily. Start taking after completion of starter pack.   Apixaban  Starter Pack, 10mg  and 5mg , (ELIQUIS  DVT/PE STARTER PACK) Take as directed on package: start with two-5mg  tablets twice daily for 7 days. On day 8, switch to one-5mg  tablet twice daily.   aspirin  EC 81 MG tablet Take 1 tablet (81 mg total) by mouth daily. Swallow whole.   atorvastatin  (LIPITOR) 40 MG tablet Take 1 tablet (40 mg total) every evening by mouth.   azathioprine (IMURAN) 100 MG tablet Take 100 mg by mouth daily.   BUTRANS  20 MCG/HR PTWK patch Place 20 mcg onto the skin every Monday.    carvedilol  (COREG ) 12.5 MG tablet Take 1 tablet (12.5 mg total) by mouth 2 (two) times daily with a meal.   cycloSPORINE modified (NEORAL) 100 MG capsule Take 100 mg by mouth 2 (two) times daily.   insulin  lispro (HUMALOG) 100 UNIT/ML injection See admin instructions. Uses via insulin  pump per bolus rate   LINZESS  145 MCG CAPS capsule Take 145 mcg by mouth daily as needed (for constipation).    tamsulosin (FLOMAX) 0.4 MG CAPS capsule Take 0.4 mg by mouth 2 (two) times  daily.   thiamine  (VITAMIN B1) 100 MG tablet Take 100 mg by mouth daily.   CONTOUR NEXT TEST test strip  (Patient not taking: Reported on 06/25/2024)   predniSONE (DELTASONE) 10 MG tablet Take 10 mg by mouth daily.   No facility-administered encounter medications on file as of 06/25/2024.     Review of Systems  Review of Systems  No chest pain with exertion.  No orthopnea or PND.  Comprehensive review of systems otherwise negative. Physical Exam  BP 123/66   Pulse 81   Temp 97.7 F (36.5 C) (Oral)   Ht 5' 9 (1.753 m)   Wt 149 lb (67.6 kg)   SpO2 96%   BMI 22.00 kg/m   Wt Readings from Last 5 Encounters:  06/25/24 149 lb (67.6 kg)  04/23/24 152 lb 3.2 oz (69 kg)  02/03/23 142 lb (64.4 kg)  07/08/19 132 lb 9.6 oz (60.1 kg)  05/27/19  134 lb 8 oz (61 kg)    BMI Readings from Last 5 Encounters:  06/25/24 22.00 kg/m  04/23/24 22.48 kg/m  02/03/23 20.97 kg/m  07/08/19 19.58 kg/m  05/27/19 19.86 kg/m     Physical Exam General: Sitting in chair, no acute distress Eyes: EOMI, no icterus Neck: Supple, no JVP Pulmonary: Clear, good air excursion Cardiovascular: Warm, no edema   Assessment & Plan:   Dyspnea on exertion: Started after appreciating renal transplant years ago.  No issues prior to that.  Insidious onset no clear other triggering event.  Imaging reveals emphysema likely contributor.  Echocardiogram reveals mitral valve regurgitation, likely contributor.  Signs of elevated pulmonary pressures, likely pulmonary hypertension since 2016.  Likely contributor.  PFTs to further evaluation to see if inhalers would be of benefit.  He is use inhalers in the past with no benefit.  Presumed pulmonary hypertension: Based on echocardiogram.  Elevated PASP dates back to 2016.  Normal RV size and function in 2025.  Longstanding issue.  High concern for group 2 disease given mitral valve regurgitation and likely in the setting of intermittent fluid overload when was dialysis dependent.  Stability over time argues against Group 1 disease.  Treatment would be a challenge assuming hemodynamics are consistent with precapillary pulmonary hypertension given emphysema.  I recommend heart heart catheterization.  He is precontemplative.  Will discuss again in the future.   Return in about 3 months (around 09/25/2024) for f/u Dr. Annella, after PFT.   Daniel JONELLE Annella, MD 06/25/2024   This appointment required 61 minutes of patient care (this includes precharting, chart review, review of results, face-to-face care, etc.).

## 2024-06-25 NOTE — Patient Instructions (Signed)
 Nice to meet you  No change in the medication  Pulmonary function test for further evaluation  We discussed pulmonary hypertension.  I suspect this based on your echocardiogram.  There is been signs of this since 2016.  So not progressed very fast.  To diagnosis and help decide if treatment would be an option, we would need to do a right heart catheterization.  We will discuss this in detail again at your next visit  Return to clinic in 3 months or sooner as needed with Dr. Annella after pulmonary function test

## 2024-07-14 NOTE — Progress Notes (Signed)
 This encounter was created in error - please disregard.

## 2024-07-17 ENCOUNTER — Inpatient Hospital Stay (HOSPITAL_COMMUNITY)

## 2024-07-17 ENCOUNTER — Emergency Department (HOSPITAL_COMMUNITY)

## 2024-07-17 ENCOUNTER — Inpatient Hospital Stay (HOSPITAL_COMMUNITY)
Admission: EM | Admit: 2024-07-17 | Discharge: 2024-07-22 | DRG: 286 | Disposition: A | Attending: Critical Care Medicine | Admitting: Critical Care Medicine

## 2024-07-17 ENCOUNTER — Encounter (HOSPITAL_COMMUNITY): Admission: EM | Disposition: A | Payer: Self-pay | Source: Home / Self Care | Attending: Critical Care Medicine

## 2024-07-17 ENCOUNTER — Encounter (HOSPITAL_COMMUNITY): Payer: Self-pay | Admitting: Family Medicine

## 2024-07-17 DIAGNOSIS — I272 Pulmonary hypertension, unspecified: Secondary | ICD-10-CM | POA: Diagnosis not present

## 2024-07-17 DIAGNOSIS — Q2112 Patent foramen ovale: Secondary | ICD-10-CM | POA: Diagnosis not present

## 2024-07-17 DIAGNOSIS — I50813 Acute on chronic right heart failure: Secondary | ICD-10-CM

## 2024-07-17 DIAGNOSIS — Z86718 Personal history of other venous thrombosis and embolism: Secondary | ICD-10-CM | POA: Diagnosis not present

## 2024-07-17 DIAGNOSIS — E1029 Type 1 diabetes mellitus with other diabetic kidney complication: Secondary | ICD-10-CM | POA: Diagnosis not present

## 2024-07-17 DIAGNOSIS — I5033 Acute on chronic diastolic (congestive) heart failure: Secondary | ICD-10-CM | POA: Diagnosis present

## 2024-07-17 DIAGNOSIS — Z7901 Long term (current) use of anticoagulants: Secondary | ICD-10-CM | POA: Diagnosis not present

## 2024-07-17 DIAGNOSIS — E1022 Type 1 diabetes mellitus with diabetic chronic kidney disease: Secondary | ICD-10-CM

## 2024-07-17 DIAGNOSIS — J90 Pleural effusion, not elsewhere classified: Secondary | ICD-10-CM | POA: Insufficient documentation

## 2024-07-17 DIAGNOSIS — N179 Acute kidney failure, unspecified: Secondary | ICD-10-CM | POA: Diagnosis not present

## 2024-07-17 DIAGNOSIS — I2781 Cor pulmonale (chronic): Secondary | ICD-10-CM

## 2024-07-17 DIAGNOSIS — Z794 Long term (current) use of insulin: Secondary | ICD-10-CM | POA: Diagnosis not present

## 2024-07-17 DIAGNOSIS — J9601 Acute respiratory failure with hypoxia: Secondary | ICD-10-CM | POA: Diagnosis present

## 2024-07-17 DIAGNOSIS — N1831 Chronic kidney disease, stage 3a: Secondary | ICD-10-CM | POA: Diagnosis present

## 2024-07-17 DIAGNOSIS — E875 Hyperkalemia: Secondary | ICD-10-CM | POA: Diagnosis not present

## 2024-07-17 DIAGNOSIS — R809 Proteinuria, unspecified: Secondary | ICD-10-CM | POA: Diagnosis not present

## 2024-07-17 DIAGNOSIS — Z940 Kidney transplant status: Secondary | ICD-10-CM | POA: Diagnosis not present

## 2024-07-17 DIAGNOSIS — R339 Retention of urine, unspecified: Secondary | ICD-10-CM | POA: Diagnosis not present

## 2024-07-17 DIAGNOSIS — E871 Hypo-osmolality and hyponatremia: Secondary | ICD-10-CM | POA: Diagnosis present

## 2024-07-17 DIAGNOSIS — J439 Emphysema, unspecified: Secondary | ICD-10-CM | POA: Diagnosis present

## 2024-07-17 DIAGNOSIS — Y830 Surgical operation with transplant of whole organ as the cause of abnormal reaction of the patient, or of later complication, without mention of misadventure at the time of the procedure: Secondary | ICD-10-CM | POA: Diagnosis present

## 2024-07-17 DIAGNOSIS — I2721 Secondary pulmonary arterial hypertension: Secondary | ICD-10-CM | POA: Diagnosis present

## 2024-07-17 DIAGNOSIS — I509 Heart failure, unspecified: Principal | ICD-10-CM

## 2024-07-17 DIAGNOSIS — I2723 Pulmonary hypertension due to lung diseases and hypoxia: Secondary | ICD-10-CM | POA: Diagnosis present

## 2024-07-17 DIAGNOSIS — I361 Nonrheumatic tricuspid (valve) insufficiency: Secondary | ICD-10-CM | POA: Diagnosis present

## 2024-07-17 DIAGNOSIS — I159 Secondary hypertension, unspecified: Secondary | ICD-10-CM | POA: Diagnosis not present

## 2024-07-17 DIAGNOSIS — I10 Essential (primary) hypertension: Secondary | ICD-10-CM | POA: Diagnosis present

## 2024-07-17 DIAGNOSIS — T8619 Other complication of kidney transplant: Secondary | ICD-10-CM | POA: Diagnosis present

## 2024-07-17 DIAGNOSIS — J9600 Acute respiratory failure, unspecified whether with hypoxia or hypercapnia: Secondary | ICD-10-CM | POA: Diagnosis present

## 2024-07-17 DIAGNOSIS — Z9483 Pancreas transplant status: Secondary | ICD-10-CM

## 2024-07-17 DIAGNOSIS — J441 Chronic obstructive pulmonary disease with (acute) exacerbation: Secondary | ICD-10-CM | POA: Diagnosis present

## 2024-07-17 DIAGNOSIS — E7800 Pure hypercholesterolemia, unspecified: Secondary | ICD-10-CM | POA: Diagnosis present

## 2024-07-17 DIAGNOSIS — I2510 Atherosclerotic heart disease of native coronary artery without angina pectoris: Secondary | ICD-10-CM | POA: Diagnosis present

## 2024-07-17 DIAGNOSIS — I5082 Biventricular heart failure: Secondary | ICD-10-CM | POA: Diagnosis present

## 2024-07-17 DIAGNOSIS — E109 Type 1 diabetes mellitus without complications: Secondary | ICD-10-CM | POA: Diagnosis present

## 2024-07-17 DIAGNOSIS — Z8673 Personal history of transient ischemic attack (TIA), and cerebral infarction without residual deficits: Secondary | ICD-10-CM

## 2024-07-17 DIAGNOSIS — I130 Hypertensive heart and chronic kidney disease with heart failure and stage 1 through stage 4 chronic kidney disease, or unspecified chronic kidney disease: Secondary | ICD-10-CM | POA: Diagnosis present

## 2024-07-17 DIAGNOSIS — Z1152 Encounter for screening for COVID-19: Secondary | ICD-10-CM | POA: Diagnosis not present

## 2024-07-17 HISTORY — PX: RIGHT HEART CATH: CATH118263

## 2024-07-17 LAB — RESPIRATORY PANEL BY PCR

## 2024-07-17 LAB — I-STAT CHEM 8, ED
BUN: 51 mg/dL — ABNORMAL HIGH (ref 8–23)
Calcium, Ion: 1.06 mmol/L — ABNORMAL LOW (ref 1.15–1.40)
Chloride: 108 mmol/L (ref 98–111)
Creatinine, Ser: 1.6 mg/dL — ABNORMAL HIGH (ref 0.61–1.24)
Glucose, Bld: 210 mg/dL — ABNORMAL HIGH (ref 70–99)
HCT: 48 % (ref 39.0–52.0)
Hemoglobin: 16.3 g/dL (ref 13.0–17.0)
Potassium: 6.1 mmol/L — ABNORMAL HIGH (ref 3.5–5.1)
Sodium: 137 mmol/L (ref 135–145)
TCO2: 20 mmol/L — ABNORMAL LOW (ref 22–32)

## 2024-07-17 LAB — POCT I-STAT EG7
Acid-base deficit: 6 mmol/L — ABNORMAL HIGH (ref 0.0–2.0)
Acid-base deficit: 7 mmol/L — ABNORMAL HIGH (ref 0.0–2.0)
Bicarbonate: 16.8 mmol/L — ABNORMAL LOW (ref 20.0–28.0)
Bicarbonate: 17 mmol/L — ABNORMAL LOW (ref 20.0–28.0)
Calcium, Ion: 1.13 mmol/L — ABNORMAL LOW (ref 1.15–1.40)
Calcium, Ion: 1.17 mmol/L (ref 1.15–1.40)
HCT: 44 % (ref 39.0–52.0)
HCT: 44 % (ref 39.0–52.0)
Hemoglobin: 15 g/dL (ref 13.0–17.0)
Hemoglobin: 15 g/dL (ref 13.0–17.0)
O2 Saturation: 70 %
O2 Saturation: 73 %
Potassium: 5 mmol/L (ref 3.5–5.1)
Potassium: 5.1 mmol/L (ref 3.5–5.1)
Sodium: 138 mmol/L (ref 135–145)
Sodium: 138 mmol/L (ref 135–145)
TCO2: 18 mmol/L — ABNORMAL LOW (ref 22–32)
TCO2: 18 mmol/L — ABNORMAL LOW (ref 22–32)
pCO2, Ven: 27.8 mmHg — ABNORMAL LOW (ref 44–60)
pCO2, Ven: 28.2 mmHg — ABNORMAL LOW (ref 44–60)
pH, Ven: 7.383 (ref 7.25–7.43)
pH, Ven: 7.394 (ref 7.25–7.43)
pO2, Ven: 36 mmHg (ref 32–45)
pO2, Ven: 38 mmHg (ref 32–45)

## 2024-07-17 LAB — URINALYSIS, COMPLETE (UACMP) WITH MICROSCOPIC
Bilirubin Urine: NEGATIVE
Glucose, UA: NEGATIVE mg/dL
Ketones, ur: NEGATIVE mg/dL
Leukocytes,Ua: NEGATIVE
Nitrite: NEGATIVE
Protein, ur: NEGATIVE mg/dL
Specific Gravity, Urine: 1.01 (ref 1.005–1.030)
pH: 5.5 (ref 5.0–8.0)

## 2024-07-17 LAB — COMPREHENSIVE METABOLIC PANEL WITH GFR
ALT: 18 U/L (ref 0–44)
AST: 24 U/L (ref 15–41)
Albumin: 2.6 g/dL — ABNORMAL LOW (ref 3.5–5.0)
Alkaline Phosphatase: 65 U/L (ref 38–126)
Anion gap: 12 (ref 5–15)
BUN: 36 mg/dL — ABNORMAL HIGH (ref 8–23)
CO2: 20 mmol/L — ABNORMAL LOW (ref 22–32)
Calcium: 8.4 mg/dL — ABNORMAL LOW (ref 8.9–10.3)
Chloride: 105 mmol/L (ref 98–111)
Creatinine, Ser: 1.8 mg/dL — ABNORMAL HIGH (ref 0.61–1.24)
GFR, Estimated: 42 mL/min — ABNORMAL LOW (ref >60)
Glucose, Bld: 213 mg/dL — ABNORMAL HIGH (ref 70–99)
Potassium: 5.8 mmol/L — ABNORMAL HIGH (ref 3.5–5.1)
Sodium: 137 mmol/L (ref 135–145)
Total Bilirubin: 5.4 mg/dL — ABNORMAL HIGH (ref 0.0–1.2)
Total Protein: 5.6 g/dL — ABNORMAL LOW (ref 6.5–8.1)

## 2024-07-17 LAB — CBG MONITORING, ED
Glucose-Capillary: 209 mg/dL — ABNORMAL HIGH (ref 70–99)
Glucose-Capillary: 229 mg/dL — ABNORMAL HIGH (ref 70–99)

## 2024-07-17 LAB — LACTIC ACID, PLASMA
Lactic Acid, Venous: 1.8 mmol/L (ref 0.5–1.9)
Lactic Acid, Venous: 2.1 mmol/L (ref 0.5–1.9)

## 2024-07-17 LAB — SODIUM, URINE, RANDOM: Sodium, Ur: 116 mmol/L

## 2024-07-17 LAB — I-STAT VENOUS BLOOD GAS, ED
Acid-base deficit: 5 mmol/L — ABNORMAL HIGH (ref 0.0–2.0)
Bicarbonate: 21.1 mmol/L (ref 20.0–28.0)
Calcium, Ion: 1.06 mmol/L — ABNORMAL LOW (ref 1.15–1.40)
HCT: 48 % (ref 39.0–52.0)
Hemoglobin: 16.3 g/dL (ref 13.0–17.0)
O2 Saturation: 71 %
Potassium: 6.1 mmol/L — ABNORMAL HIGH (ref 3.5–5.1)
Sodium: 136 mmol/L (ref 135–145)
TCO2: 22 mmol/L (ref 22–32)
pCO2, Ven: 42 mmHg — ABNORMAL LOW (ref 44–60)
pH, Ven: 7.31 (ref 7.25–7.43)
pO2, Ven: 41 mmHg (ref 32–45)

## 2024-07-17 LAB — GLUCOSE, CAPILLARY
Glucose-Capillary: 235 mg/dL — ABNORMAL HIGH (ref 70–99)
Glucose-Capillary: 290 mg/dL — ABNORMAL HIGH (ref 70–99)

## 2024-07-17 LAB — BASIC METABOLIC PANEL WITH GFR
Anion gap: 14 (ref 5–15)
BUN: 43 mg/dL — ABNORMAL HIGH (ref 8–23)
CO2: 17 mmol/L — ABNORMAL LOW (ref 22–32)
Calcium: 8 mg/dL — ABNORMAL LOW (ref 8.9–10.3)
Chloride: 105 mmol/L (ref 98–111)
Creatinine, Ser: 1.96 mg/dL — ABNORMAL HIGH (ref 0.61–1.24)
GFR, Estimated: 37 mL/min — ABNORMAL LOW (ref >60)
Glucose, Bld: 213 mg/dL — ABNORMAL HIGH (ref 70–99)
Potassium: 5.6 mmol/L — ABNORMAL HIGH (ref 3.5–5.1)
Sodium: 136 mmol/L (ref 135–145)

## 2024-07-17 LAB — CBC WITH DIFFERENTIAL/PLATELET
Abs Immature Granulocytes: 0.13 K/uL — ABNORMAL HIGH (ref 0.00–0.07)
Basophils Absolute: 0 K/uL (ref 0.0–0.1)
Basophils Relative: 0 %
Eosinophils Absolute: 0 K/uL (ref 0.0–0.5)
Eosinophils Relative: 0 %
HCT: 47.3 % (ref 39.0–52.0)
Hemoglobin: 14.7 g/dL (ref 13.0–17.0)
Immature Granulocytes: 1 %
Lymphocytes Relative: 2 %
Lymphs Abs: 0.2 K/uL — ABNORMAL LOW (ref 0.7–4.0)
MCH: 26.9 pg (ref 26.0–34.0)
MCHC: 31.1 g/dL (ref 30.0–36.0)
MCV: 86.5 fL (ref 80.0–100.0)
Monocytes Absolute: 0.6 K/uL (ref 0.1–1.0)
Monocytes Relative: 6 %
Neutro Abs: 8.9 K/uL — ABNORMAL HIGH (ref 1.7–7.7)
Neutrophils Relative %: 91 %
Platelets: 202 K/uL (ref 150–400)
RBC: 5.47 MIL/uL (ref 4.22–5.81)
RDW: 17.2 % — ABNORMAL HIGH (ref 11.5–15.5)
WBC: 9.9 K/uL (ref 4.0–10.5)
nRBC: 0.5 % — ABNORMAL HIGH (ref 0.0–0.2)

## 2024-07-17 LAB — I-STAT CG4 LACTIC ACID, ED
Lactic Acid, Venous: 2.9 mmol/L (ref 0.5–1.9)
Lactic Acid, Venous: 3 mmol/L (ref 0.5–1.9)

## 2024-07-17 LAB — CREATININE, URINE, RANDOM: Creatinine, Urine: 28 mg/dL

## 2024-07-17 LAB — RESP PANEL BY RT-PCR (RSV, FLU A&B, COVID)  RVPGX2
Influenza A by PCR: NEGATIVE
Influenza B by PCR: NEGATIVE
Resp Syncytial Virus by PCR: NEGATIVE
SARS Coronavirus 2 by RT PCR: NEGATIVE

## 2024-07-17 LAB — MRSA NEXT GEN BY PCR, NASAL: MRSA by PCR Next Gen: NOT DETECTED

## 2024-07-17 LAB — TROPONIN I (HIGH SENSITIVITY)
Troponin I (High Sensitivity): 50 ng/L — ABNORMAL HIGH (ref <18)
Troponin I (High Sensitivity): 118 ng/L (ref <18)
Troponin I (High Sensitivity): 137 ng/L (ref <18)

## 2024-07-17 LAB — BRAIN NATRIURETIC PEPTIDE: B Natriuretic Peptide: 456 pg/mL — ABNORMAL HIGH (ref 0.0–100.0)

## 2024-07-17 LAB — HEMOGLOBIN A1C
Hgb A1c MFr Bld: 8.1 % — ABNORMAL HIGH (ref 4.8–5.6)
Mean Plasma Glucose: 185.77 mg/dL

## 2024-07-17 SURGERY — RIGHT HEART CATH
Anesthesia: LOCAL

## 2024-07-17 MED ORDER — SODIUM CHLORIDE 0.9% FLUSH
3.0000 mL | Freq: Two times a day (BID) | INTRAVENOUS | Status: DC
  Administered 2024-07-17 – 2024-07-21 (×8): 3 mL via INTRAVENOUS

## 2024-07-17 MED ORDER — SODIUM CHLORIDE 0.9 % IV SOLN
100.0000 mg | Freq: Two times a day (BID) | INTRAVENOUS | Status: DC
  Administered 2024-07-18 (×2): 100 mg via INTRAVENOUS
  Filled 2024-07-17 (×3): qty 100

## 2024-07-17 MED ORDER — FUROSEMIDE 10 MG/ML IJ SOLN
40.0000 mg | Freq: Two times a day (BID) | INTRAMUSCULAR | Status: DC
  Administered 2024-07-17: 40 mg via INTRAVENOUS
  Filled 2024-07-17: qty 4

## 2024-07-17 MED ORDER — PREDNISONE 20 MG PO TABS
10.0000 mg | ORAL_TABLET | Freq: Every day | ORAL | Status: DC
  Administered 2024-07-17: 10 mg via ORAL
  Filled 2024-07-17: qty 2

## 2024-07-17 MED ORDER — SODIUM CHLORIDE 0.9% FLUSH: 3.0000 mL | INTRAVENOUS | Status: DC | PRN

## 2024-07-17 MED ORDER — FENTANYL CITRATE (PF) 100 MCG/2ML IJ SOLN
INTRAMUSCULAR | Status: DC | PRN
  Administered 2024-07-17 (×2): 25 ug via INTRAVENOUS

## 2024-07-17 MED ORDER — VANCOMYCIN HCL IN DEXTROSE 1-5 GM/200ML-% IV SOLN: 1000.0000 mg | Freq: Once | INTRAVENOUS | Status: DC

## 2024-07-17 MED ORDER — CHLORHEXIDINE GLUCONATE CLOTH 2 % EX PADS
6.0000 | MEDICATED_PAD | Freq: Every day | CUTANEOUS | Status: DC
  Administered 2024-07-18 – 2024-07-22 (×5): 6 via TOPICAL

## 2024-07-17 MED ORDER — POLYETHYLENE GLYCOL 3350 17 G PO PACK
17.0000 g | PACK | Freq: Every day | ORAL | Status: DC
  Administered 2024-07-19 – 2024-07-22 (×4): 17 g via ORAL
  Filled 2024-07-17 (×5): qty 1

## 2024-07-17 MED ORDER — ARFORMOTEROL TARTRATE 15 MCG/2ML IN NEBU
15.0000 ug | INHALATION_SOLUTION | Freq: Two times a day (BID) | RESPIRATORY_TRACT | Status: DC
  Administered 2024-07-18 – 2024-07-22 (×9): 15 ug via RESPIRATORY_TRACT
  Filled 2024-07-17 (×11): qty 2

## 2024-07-17 MED ORDER — INSULIN ASPART 100 UNIT/ML IJ SOLN: 0.0000 [IU] | Freq: Three times a day (TID) | INTRAMUSCULAR | Status: DC

## 2024-07-17 MED ORDER — SENNA 8.6 MG PO TABS: 1.0000 | ORAL_TABLET | Freq: Every day | ORAL | Status: DC | PRN

## 2024-07-17 MED ORDER — FUROSEMIDE 10 MG/ML IJ SOLN
60.0000 mg | Freq: Once | INTRAMUSCULAR | Status: AC
  Administered 2024-07-17: 60 mg via INTRAVENOUS
  Filled 2024-07-17: qty 6

## 2024-07-17 MED ORDER — METHYLPREDNISOLONE SODIUM SUCC 40 MG IJ SOLR
40.0000 mg | Freq: Every day | INTRAMUSCULAR | Status: DC
  Administered 2024-07-17 – 2024-07-19 (×3): 40 mg via INTRAVENOUS
  Filled 2024-07-17 (×3): qty 1

## 2024-07-17 MED ORDER — DOCUSATE SODIUM 100 MG PO CAPS
100.0000 mg | ORAL_CAPSULE | Freq: Two times a day (BID) | ORAL | Status: DC | PRN
  Administered 2024-07-20 – 2024-07-21 (×2): 100 mg via ORAL
  Filled 2024-07-17 (×2): qty 1

## 2024-07-17 MED ORDER — METRONIDAZOLE 500 MG/100ML IV SOLN
500.0000 mg | Freq: Once | INTRAVENOUS | Status: DC
  Filled 2024-07-17: qty 100

## 2024-07-17 MED ORDER — LIDOCAINE HCL (PF) 1 % IJ SOLN
INTRAMUSCULAR | Status: AC
  Filled 2024-07-17: qty 30

## 2024-07-17 MED ORDER — ASPIRIN 81 MG PO CHEW
81.0000 mg | CHEWABLE_TABLET | ORAL | Status: AC
  Administered 2024-07-18: 81 mg via ORAL
  Filled 2024-07-17: qty 1

## 2024-07-17 MED ORDER — SODIUM CHLORIDE 0.9 % IV SOLN
2.0000 g | Freq: Once | INTRAVENOUS | Status: DC
  Filled 2024-07-17: qty 12.5

## 2024-07-17 MED ORDER — IOHEXOL 350 MG/ML SOLN
75.0000 mL | Freq: Once | INTRAVENOUS | Status: AC | PRN
  Administered 2024-07-17: 75 mL via INTRAVENOUS

## 2024-07-17 MED ORDER — SODIUM CHLORIDE 0.9% FLUSH
3.0000 mL | Freq: Two times a day (BID) | INTRAVENOUS | Status: DC
  Administered 2024-07-17 – 2024-07-21 (×5): 3 mL via INTRAVENOUS

## 2024-07-17 MED ORDER — CARVEDILOL 12.5 MG PO TABS
12.5000 mg | ORAL_TABLET | Freq: Two times a day (BID) | ORAL | Status: DC
  Administered 2024-07-17: 12.5 mg via ORAL
  Filled 2024-07-17: qty 1

## 2024-07-17 MED ORDER — MIDAZOLAM HCL (PF) 2 MG/2ML IJ SOLN
INTRAMUSCULAR | Status: DC | PRN
  Administered 2024-07-17: 1 mg via INTRAVENOUS

## 2024-07-17 MED ORDER — PROCHLORPERAZINE EDISYLATE 10 MG/2ML IJ SOLN
5.0000 mg | Freq: Four times a day (QID) | INTRAMUSCULAR | Status: DC | PRN
  Administered 2024-07-20: 5 mg via INTRAVENOUS
  Filled 2024-07-17: qty 2

## 2024-07-17 MED ORDER — AZATHIOPRINE 50 MG PO TABS
100.0000 mg | ORAL_TABLET | Freq: Every day | ORAL | Status: DC
  Administered 2024-07-17 – 2024-07-18 (×2): 100 mg via ORAL
  Filled 2024-07-17 (×4): qty 2

## 2024-07-17 MED ORDER — SODIUM CHLORIDE 0.9 % IV SOLN: 250.0000 mL | INTRAVENOUS | Status: AC | PRN

## 2024-07-17 MED ORDER — APIXABAN 5 MG PO TABS
5.0000 mg | ORAL_TABLET | Freq: Two times a day (BID) | ORAL | Status: DC
  Administered 2024-07-17 – 2024-07-22 (×11): 5 mg via ORAL
  Filled 2024-07-17 (×11): qty 1

## 2024-07-17 MED ORDER — ACETAMINOPHEN 325 MG PO TABS
650.0000 mg | ORAL_TABLET | Freq: Four times a day (QID) | ORAL | Status: DC | PRN
  Administered 2024-07-18 – 2024-07-19 (×2): 650 mg via ORAL
  Filled 2024-07-17 (×2): qty 2

## 2024-07-17 MED ORDER — MIDAZOLAM HCL 2 MG/2ML IJ SOLN
INTRAMUSCULAR | Status: AC
  Filled 2024-07-17: qty 2

## 2024-07-17 MED ORDER — SODIUM CHLORIDE 0.9 % IV SOLN
2.0000 g | INTRAVENOUS | Status: DC
  Administered 2024-07-18 – 2024-07-19 (×2): 2 g via INTRAVENOUS
  Filled 2024-07-17 (×2): qty 20

## 2024-07-17 MED ORDER — TAMSULOSIN HCL 0.4 MG PO CAPS
0.4000 mg | ORAL_CAPSULE | Freq: Two times a day (BID) | ORAL | Status: DC
  Administered 2024-07-17 – 2024-07-22 (×11): 0.4 mg via ORAL
  Filled 2024-07-17 (×11): qty 1

## 2024-07-17 MED ORDER — SODIUM ZIRCONIUM CYCLOSILICATE 10 G PO PACK
10.0000 g | PACK | Freq: Four times a day (QID) | ORAL | Status: AC
  Administered 2024-07-17: 10 g via ORAL
  Filled 2024-07-17: qty 1

## 2024-07-17 MED ORDER — LIDOCAINE HCL (PF) 1 % IJ SOLN
INTRAMUSCULAR | Status: DC | PRN
  Administered 2024-07-17: 10 mL via INTRADERMAL

## 2024-07-17 MED ORDER — INSULIN ASPART 100 UNIT/ML IJ SOLN: 0.0000 [IU] | Freq: Every day | INTRAMUSCULAR | Status: DC

## 2024-07-17 MED ORDER — ACETAMINOPHEN 650 MG RE SUPP: 650.0000 mg | Freq: Four times a day (QID) | RECTAL | Status: DC | PRN

## 2024-07-17 MED ORDER — FENTANYL CITRATE (PF) 100 MCG/2ML IJ SOLN
INTRAMUSCULAR | Status: AC
  Filled 2024-07-17: qty 2

## 2024-07-17 MED ORDER — CYCLOSPORINE MODIFIED (GENGRAF) 25 MG PO CAPS
100.0000 mg | ORAL_CAPSULE | Freq: Two times a day (BID) | ORAL | Status: DC
  Administered 2024-07-17 – 2024-07-22 (×11): 100 mg via ORAL
  Filled 2024-07-17 (×7): qty 1
  Filled 2024-07-17: qty 4
  Filled 2024-07-17 (×8): qty 1

## 2024-07-17 MED ORDER — REVEFENACIN 175 MCG/3ML IN SOLN
175.0000 ug | Freq: Every day | RESPIRATORY_TRACT | Status: DC
  Administered 2024-07-18 – 2024-07-22 (×5): 175 ug via RESPIRATORY_TRACT
  Filled 2024-07-17 (×6): qty 3

## 2024-07-17 MED ORDER — LACTATED RINGERS IV BOLUS: 1000.0000 mL | Freq: Once | INTRAVENOUS | Status: DC

## 2024-07-17 MED ORDER — INSULIN PUMP
Freq: Three times a day (TID) | SUBCUTANEOUS | Status: DC
  Administered 2024-07-17 (×2): 1 via SUBCUTANEOUS
  Filled 2024-07-17: qty 1

## 2024-07-17 MED ORDER — ATORVASTATIN CALCIUM 40 MG PO TABS
40.0000 mg | ORAL_TABLET | Freq: Every day | ORAL | Status: DC
  Administered 2024-07-17 – 2024-07-22 (×6): 40 mg via ORAL
  Filled 2024-07-17 (×6): qty 1

## 2024-07-17 SURGICAL SUPPLY — 9 items
CATH SWAN GANZ VIP 7.5F (CATHETERS) IMPLANT
KIT MICROPUNCTURE NIT STIFF (SHEATH) IMPLANT
KIT RIGHT HEART ACIST (MISCELLANEOUS) IMPLANT
PACK CARDIAC CATHETERIZATION (CUSTOM PROCEDURE TRAY) ×1 IMPLANT
SHEATH PINNACLE 8F 10CM (SHEATH) IMPLANT
SHEATH PROBE COVER 6X72 (BAG) IMPLANT
SHIELD CATH-GARD CONTAMINATION (MISCELLANEOUS) IMPLANT
TRANSDUCER W/STOPCOCK (MISCELLANEOUS) IMPLANT
TUBING ART PRESS 72 MALE/FEM (TUBING) IMPLANT

## 2024-07-17 NOTE — Consult Note (Addendum)
 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 He has chronic RLE edema and was found to have extensive DVT in that leg in 04/2024 and was started on apixaban . He seldom misses doses of DOAC. He did not have a PE at that time, but he was found to have PH and R heart failure with dilated RV with TR in 05/2024. In the ED he was hypoxic and requiring NRB for hypoxia. He evaluated with CTA that did not demonstrate a PE. Echo showed progressive RV failure. Heart failure evaluated him and recommended transfer to the cardiac ICU.   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 rate (!) 40, SpO2 93%.        Intake/Output Summary (Last 24 hours) at 07/17/2024 1230 Last data filed at 07/17/2024 0707 Gross per 24 hour  Intake --  Output 800 ml  Net -800 ml   There were no vitals filed for this visit.  Examination: General: chronically ill appearing man lying in bed in NAD HENT: [place]/AT, eyes anicteric Lungs: Breathing comfortably on NRB, mild tachypnea. No wheezing, no conversational dyspnea.  Cardiovascular: S1S2,  RRR, JVD. Abdomen: soft, NT Extremities: mild LE edema Neuro: Awake, alert, moving all extremities.  LA 2.9> 3 Trop 118  K+ 5.6 Bicarb 17 BUN 43 Cr 1.96 WBC 9.9 H/H 14.7/ 47.3 Platelets 202 UA <5 WBC Blood cultures pending  CT chest personally reviewed> emphysema, nodular GGO predominantly in the upper lobes. Bilateral dependent pleural effusions in bilateral bases. Dilated RV with contrast refluxing into IVC.  Resolved problem list   Assessment and Plan   Acute respiratory failure with hypoxia Emphysema Bilateral pleural effusions -bronchonchodilators- brovana & yupelri added -solumedrol 40mg   -doxy & ceftriaxone -check RVP, legionella, pneumococcus -check sputum culture -lasix 60mg  -supplemental O2 to maintain SpO2 >90%  Acute on chronic RV failure, severe PH; unclear etiology, but new in 05/2024 compared to previous echo in 2018. -RHC today -lasix 60mg   -swan monitoring; CXR now to confirm placement  -suspect he needs eval for CTEPH as workup for PH  H/o CAD -aspirin , statin -hold PTA coreg  for now  AKI Hyperkalemia -lokelma -diuresis  History of renal and kidney transplant -con't PTA prednisone, imuran, cyclosporine  H/o DM -ok to con't insulin  pump as long as he remains awake/ alert  H/o DVT -con't PTA apixaban   H/o BPH -con't PTA tamsulosin  Medical decision maker-- [name], Daniel Santos  Full code  Labs   CBC: Recent Labs  Lab 07/17/24 0112 07/17/24 0122  WBC 9.9  --   NEUTROABS 8.9*  --   HGB 14.7 16.3  16.3  HCT 47.3 48.0  48.0  MCV 86.5  --   PLT 202  --     Basic Metabolic Panel: Recent Labs  Lab 07/17/24 0112 07/17/24 0122 07/17/24 0915  NA 137 136  137 136  K 5.8* 6.1*  6.1* 5.6*  CL 105 108 105  CO2 20*  --  17*  GLUCOSE 213* 210* 213*  BUN 36* 51* 43*  CREATININE 1.80* 1.60* 1.96*  CALCIUM  8.4*  --  8.0*   GFR: CrCl cannot be calculated (Unknown ideal weight.). Recent Labs  Lab 07/17/24 0112  07/17/24 0123 07/17/24 0558  WBC 9.9  --   --   LATICACIDVEN  --  2.9* 3.0*    Liver Function Tests: Recent Labs  Lab 07/17/24 0112  AST 24  ALT 18  ALKPHOS 65  BILITOT 5.4*  PROT 5.6*  ALBUMIN 2.6*   No results for input(s): LIPASE, AMYLASE in the last 168 hours. No results for input(s): AMMONIA in the last 168 hours.  ABG    Component Value Date/Time   HCO3 21.1 07/17/2024 0122   TCO2 20 (L) 07/17/2024 0122   TCO2 22 07/17/2024 0122   ACIDBASEDEF 5.0 (H) 07/17/2024 0122   O2SAT 71 07/17/2024 0122     Coagulation Profile: No results for input(s): INR, PROTIME in the last 168 hours.  Cardiac Enzymes: No results for input(s): CKTOTAL, CKMB, CKMBINDEX, TROPONINI in the last 168 hours.  HbA1C: Hgb A1c MFr Bld  Date/Time Value Ref Range Status  10/26/2017 09:29 AM 7.7 (H) 4.8 - 5.6 % Final    Comment:    (NOTE) Pre diabetes:          5.7%-6.4% Diabetes:              >6.4% Glycemic control for   <7.0% adults with diabetes   03/23/2017 05:59 AM 7.4 (H) 4.8 - 5.6 % Final    Comment:    (NOTE) Pre diabetes:          5.7%-6.4% Diabetes:              >6.4% Glycemic control for   <7.0% adults with diabetes     CBG: Recent Labs  Lab 07/17/24 0752 07/17/24 1151  GLUCAP 209* 229*    Review of Systems:   Review of Systems  Constitutional:  Positive for malaise/fatigue. Negative for fever.  Respiratory:  Positive for cough, sputum production and shortness of breath. Negative for wheezing.   Cardiovascular:  Positive for leg swelling. Negative for chest pain.  Gastrointestinal:  Negative for nausea and vomiting.  Skin:  Negative for rash.  Neurological:  Negative for weakness.     Past Medical History:  He,  has a past medical history of Anemia, Atypical chest pain (12/14/2015), CKD (chronic kidney disease), stage IV (HCC) (03/23/2017), Clotting disorder, Diabetes mellitus without complication (HCC), Gastritis, Hemiparesis and alteration of  sensations as late effects of stroke (HCC) (03/25/2015), History of non-insulin  dependent diabetes mellitus, Hypercholesteremia, Hypertension, Left arm weakness, Palpitations (12/14/2015), and Stroke (HCC).   Surgical History:   Past Surgical History:  Procedure Laterality Date   AV FISTULA PLACEMENT     BASCILIC VEIN TRANSPOSITION Left 10/26/2017   Procedure: BASILIC VEIN TRANSPOSITION FIRST STAGE LEFT;  Surgeon: Oris Krystal FALCON, MD;  Location: [facility] OR;  Service: Vascular;  Laterality: Left;   BASCILIC VEIN TRANSPOSITION Left 01/18/2018   Procedure: SECOND STAGEBASILIC VEIN TRANSPOSITION LEFT ARM;  Surgeon: Oris Krystal FALCON, MD;  Location: [facility] OR;  Service: Vascular;  Laterality:  Left;   COLONOSCOPY     DIRECT LARYNGOSCOPY N/A 09/24/2018   Procedure: DIRECT LARYNGOSCOPY WITH BIOPSY,BRONCHOSOPY;  Surgeon: Terri Alan PARAS, MD;  Location: [facility] OR;  Service: ENT;  Laterality: N/A;   DRAINAGE AND CLOSURE OF LYMPHOCELE Left 11/21/2017   Procedure: DRAINAGE AND CLOSURE OF LYMPHOCELE OF LEFT UPPER ARM;  Surgeon: Harvey Carlin BRAVO, MD;  Location: [facility] OR;  Service: Vascular;  Laterality: Left;   ESOPHAGOGASTRODUODENOSCOPY     LUMBAR SPINE SURGERY  2005, 2008, 2010   REPAIR OF FRACTURED PENIS  05/2000   with cystoscopy     Social History:   reports that he quit smoking about 7 years ago. His smoking use included cigarettes. He started smoking about 47 years ago. He has a 40 pack-year smoking history. He has never used smokeless tobacco. He reports that he does not currently use alcohol. He reports that he does not use drugs.   Family History:  His family history includes Breast cancer in his maternal grandmother and paternal grandmother; Cancer in his mother; Diabetes in his paternal aunt and paternal grandmother; Kidney disease in his father; Leukemia in his paternal uncle; Stroke in his paternal grandmother. There is no history of Colon cancer, Esophageal cancer, Stomach cancer, Rectal cancer, or Colon polyps.    Allergies Allergies[1]   Home Medications  Prior to Admission medications  Medication Sig Start Date End Date Taking? Authorizing Provider  BACTRIM 400-80 MG tablet Take 1 tablet 3 times a week by oral route as directed for 90 days, for M, W, F. 03/17/22  Yes [provider]  acetaminophen  (TYLENOL ) 500 MG tablet Take 1,000 mg by mouth every 6 (six) hours as needed (pain).    [provider]  apixaban  (ELIQUIS ) 5 MG TABS tablet Take 1 tablet (5 mg total) by mouth 2 (two) times daily. Start taking after completion of starter pack. 05/02/24   Gretta Lonni PARAS, MD  Apixaban  Starter Pack, 10mg  and 5mg , (ELIQUIS  DVT/PE STARTER PACK) Take as directed on package: start with two-5mg  tablets twice daily for 7 days. On day 8, switch to one-5mg  tablet twice daily. 05/02/24   Gretta Lonni PARAS, MD  aspirin  EC 81 MG tablet Take 1 tablet (81 mg total) by mouth daily. Swallow whole. 04/23/24   Azobou Donley Joelle DEL, MD  atorvastatin  (LIPITOR) 40 MG tablet Take 1 tablet (40 mg total) every evening by mouth. 06/10/17   Kerman Soulier, MD  azathioprine (IMURAN) 100 MG tablet Take 100 mg by mouth daily.    [provider]  BUTRANS  20 MCG/HR PTWK patch Place 20 mcg onto the skin every Monday.     [provider]  carvedilol  (COREG ) 12.5 MG tablet Take 1 tablet (12.5 mg total) by mouth 2 (two) times daily with a meal. 04/23/24 04/23/25  Azobou Tonleu, Joelle DEL, MD  cycloSPORINE modified (NEORAL) 100 MG capsule Take 100 mg by mouth 2 (two) times daily.    [provider]  insulin  lispro (HUMALOG) 100 UNIT/ML injection See admin instructions. Uses via insulin  pump per bolus rate    [provider]  LINZESS  145 MCG CAPS capsule Take 145 mcg by mouth daily as needed (for constipation).  10/14/14   [provider]  predniSONE (DELTASONE) 10 MG tablet Take 10 mg by mouth daily.    [provider]  tamsulosin (FLOMAX) 0.4 MG CAPS capsule Take 0.4 mg  by mouth 2 (two) times daily.    [provider]  thiamine  (VITAMIN B1) 100 MG  tablet Take 100 mg by mouth daily.    [provider]     Critical care time:        Leita SHAUNNA Gaskins, DO 07/17/2024 1:15 PM [facility]  For contact information, see Amion. If no response to pager, please call PCCM consult pager. After hours, 7PM- 7AM, please call Elink.          [1]  Allergies Allergen Reactions   Doxycycline Nausea Only and Other (See Comments)    GI upset, kidney function

## 2024-07-17 NOTE — Progress Notes (Signed)
 PROGRESS NOTE  Norleen DELENA Yvone Mickey.  DOB: August 15, 1960  PCP: Lenon Nell SAILOR, FNP FMW:[id number]  DOA: 07/17/2024  LOS: 0 days  Hospital Day: 1  Subjective: Patient was seen and examined this morning. Propped up in bed.  Able to have a conversation.  Was feeling very cold.  Had cold peripheries. Family not at bedside. Remains afebrile, hemodynamically stable   Brief narrative: Brevan Luberto. is a 63 y.o. male with PMH significant for kidney and pancreas transplant, DM1 on insulin  pump, HTN, HLD, prior CVA with residual weakness, nonobstructive CAD, tricuspid regurg and pulmonary hypertension, h/o DVT on Eliquis  Patient reports chronic shortness of breath that has gradually worsened and reduced to a point where he is dyspneic even at rest now.  Also reports intermittent pedal edema.  Reports adherence to his medications. Last seen by his cardiologist about 2 months ago, had an echo that showed normal EF, moderate TR and severely elevated pulm artery pressure. With worsening symptoms, EMS was called, noted O2 sat low at 70% and is brought to the ED on 12/11.  In the ED, patient was afebrile, hemodynamically stable, but tachypneic and required up to 7 L of supplemental oxygen Blood gas showed pH of 7.31 with pCO2 42, bicarb 21 Labs with BUN/creatinine 36/1.8, glucose 213, potassium 5.8, BNP elevated to 456, troponin 50, lactic acid 2.9, WC count normal Respiratory virus panel unremarkable Chest x-ray showed diffuse interstitial edema, pulmonary vascular congestion suggestive of CHF Blood culture was sent Patient was given IV Lasix Admitted to [facility]   Assessment and plan: Acute decompensation of chronic right-sided heart failure  Presented with progressively worsening shortness of breath to a point where he became symptomatic at rest Initial O2 sat at 70% on room air with EMS Initial x-ray with diffuse pulm edema BNP elevated 456, lactic acid elevated as well raising suspicion of  cardiogenic shock Most recent echo from 10/22 had shown normal EF, moderate TR and severely elevated pulm artery pressure. Was given IV Lasix in the ED Given his significant history of severe pulmonary hypertension, elevated lactic acid and cold peripheries, I strongly suspect cardiogenic shock.  I requested cardiology consultation. Continue to monitor for daily intake output, weight, blood pressure, BNP, renal function and electrolytes. Net IO Since Admission: -800 mL [07/17/24 1235] Recent Labs  Lab 07/17/24 0112 07/17/24 0122 07/17/24 0123 07/17/24 0558 07/17/24 0915  BNP 456.0*  --   --   --   --   LATICACIDVEN  --   --  2.9* 3.0*  --   BUN 36* 51*  --   --  43*  CREATININE 1.80* 1.60*  --   --  1.96*  NA 137 136  137  --   --  136  K 5.8* 6.1*  6.1*  --   --  5.6*   Acute respiratory failure with hypoxia Not on supplemental oxygen at home. Required up to 7 L oxygen.  AKI H/o renal and pancreas transplant  Baseline creatinine 1.4 from September 2025.   Presented with creatinine elevated to 1.8 Need to monitor with diuresis PTA meds- cyclosporine, azathioprine, prednisone Cyclosporine level has been sent Reports compliance to above medicines Follows up with nephrology Dr. Rayburn Recent Labs    04/29/24 1456 07/17/24 0112 07/17/24 0122 07/17/24 0915  BUN  --  36* 51* 43*  CREATININE 1.40* 1.80* 1.60* 1.96*  CO2  --  20*  --  17*    Type 1 diabetes mellitus hyperglycemia A1c 7.7 from  2019.  Repeat A1c PTA meds-insulin  pump Is not clear if patient has active insulin  pump.  He is not sure if it needs to be refilled. Recent Labs  Lab 07/17/24 0752 07/17/24 1151  GLUCAP 209* 229*   Hx of DVT  Continue Eliquis      Hx of CVA  H/o nonobstructive CAD Continue Eliquis , Lipitor     Chronic urinary retention  Does clean intermittent catheterization   Gilbert syndrome AST, ALT, alk phos normal but elevated bilirubin to 5.4.   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  Manage PT eval   Goals of care   Code Status: Full Code     DVT prophylaxis:   apixaban  (ELIQUIS ) tablet 5 mg   Antimicrobials: None Fluid: None Consultants: Cardiology Family Communication: None at bedside  Status: Inpatient  Patient is from: Home Needs to continue in-hospital care: Pending cardiology evaluation Anticipated d/c to: Pending clinical course    Diet:  Diet Order             Diet NPO time specified Except for: Sips with Meds  Diet effective now                   Scheduled Meds:  apixaban   5 mg Oral BID   azaTHIOprine  100 mg Oral Daily   carvedilol   12.5 mg Oral BID WC   cycloSPORINE modified  100 mg Oral BID   furosemide  40 mg Intravenous BID   insulin  pump   Subcutaneous TID WC, HS, 0200   predniSONE  10 mg Oral Daily   sodium chloride  flush  3 mL Intravenous Q12H   tamsulosin  0.4 mg Oral BID    PRN meds: acetaminophen  **OR** acetaminophen , prochlorperazine, senna   Infusions:    Antimicrobials: Anti-infectives (From admission, onward)    Start     Dose/Rate Route Frequency Ordered Stop   07/17/24 0215  ceFEPIme (MAXIPIME) 2 g in sodium chloride  0.9 % 100 mL IVPB  Status:  Discontinued        2 g 200 mL/hr over 30 Minutes Intravenous  Once 07/17/24 0200 07/17/24 0233   07/17/24 0215  metroNIDAZOLE (FLAGYL) IVPB 500 mg  Status:  Discontinued        500 mg 100 mL/hr over 60 Minutes Intravenous  Once 07/17/24 0200 07/17/24 0233   07/17/24 0215  vancomycin (VANCOCIN) IVPB 1000 mg/200 mL premix  Status:  Discontinued        1,000 mg 200 mL/hr over 60 Minutes Intravenous  Once 07/17/24 0200 07/17/24 0233       Objective: Vitals:   07/17/24 1100 07/17/24 1200  BP: 120/61 (!) 141/86   Pulse: 85 91  Resp: (!) 23 (!) 40  Temp: 98.7 F (37.1 C)   SpO2: 94% 93%    Intake/Output Summary (Last 24 hours) at 07/17/2024 1235 Last data filed at 07/17/2024 9292 Gross per 24 hour  Intake --  Output 800 ml  Net -800 ml   There were no vitals filed for this visit. Weight change:  There is no height or weight on file to calculate BMI.   Physical Exam: General exam: Pleasant, elderly African-American male.  Feeling very cold  Skin: No rashes, lesions or ulcers. HEENT: Atraumatic, normocephalic, no obvious bleeding Lungs: Clear to auscultation bilaterally,  CVS: S1, S2, no murmur,   GI/Abd: Soft, nontender, nondistended, bowel sound present,   CNS: Alert, awake, oriented x 3 Psychiatry: Mood appropriate Extremities: No pedal edema, no calf tenderness,   Data Review: I have personally reviewed the laboratory data and studies available.  F/u labs ordered Unresulted Labs (From admission, onward)     Start     Ordered   07/18/24 0500  HIV Antibody (routine testing w rflx)  (HIV Antibody (Routine testing w reflex) panel)  Tomorrow morning,   R        07/17/24 0614   07/18/24 0500  Basic metabolic panel  Daily,   R      07/17/24 0614   07/18/24 0500  CBC  Daily,   R      07/17/24 0614   07/18/24 0500  Hepatic function panel  Tomorrow morning,   R        07/17/24 0614   07/18/24 0500  Brain natriuretic peptide  Tomorrow morning,   R        07/17/24 0923   07/18/24 0500  Lactic acid, plasma  (Lactic Acid)  Tomorrow morning,   R        07/17/24 0924   07/17/24 0800  Cyclosporine  Once-Timed,   TIMED        07/17/24 9385            Signed, Chapman Rota, MD [facility] 07/17/2024

## 2024-07-17 NOTE — Progress Notes (Signed)
 Swan overwedged waveform. CXR confirmed. Pulled back 5.5 cm after discussion with [facility].  Leita SHAUNNA Gaskins, DO 07/17/2024 6:29 PM [facility]  For contact information, see Amion. If no response to pager, please call PCCM consult pager. After hours, 7PM- 7AM, please call Elink.

## 2024-07-17 NOTE — ED Triage Notes (Signed)
 Patient brought in by EMS. Patient states he has been short of breath for a couple days. EMS states patient's O2 was 70% on room air. Patient placed on nonrebreather and breathing improved.

## 2024-07-17 NOTE — ED Notes (Signed)
 Messaged pharmacy to see if imuran will be verified.

## 2024-07-17 NOTE — ED Provider Notes (Signed)
 [place] [facility]ERGENCY DEPARTMENT AT [facility] Provider Note   CSN: [id number] Arrival date & time: 07/17/24  9966     Patient presents with: Shortness of Breath   Daniel Santos. is a 63 y.o. male.  Patient presents to the emergency department via EMS complaining of shortness of breath.  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  Patient reports worsening shortness of breath for the past few days.  It worsened today and he called EMS.  EMS noted an oxygen saturation of 70% on room air.  He was started on a nonrebreather at 15 L and his work of breathing improved from a rate of 30+ per minute into the 20+ per minute range.  He is no longer tripoding.  He is still tachypneic upon my assessment.  He endorses some mild chest pain and chills.  He denies abdominal pain, nausea, vomiting, urinary symptoms.    Shortness of Breath      Prior to Admission medications  Medication Sig Start Date End Date Taking? Authorizing Provider  acetaminophen  (TYLENOL ) 500 MG tablet Take 1,000 mg by mouth every 6 (six) hours as needed (pain).    [provider]  apixaban  (ELIQUIS ) 5 MG TABS tablet Take 1 tablet (5 mg total) by mouth 2 (two) times daily. Start taking after completion of starter pack. 05/02/24   Gretta Lonni PARAS, MD  Apixaban  Starter Pack, 10mg  and 5mg , (ELIQUIS  DVT/PE STARTER PACK) Take as directed on package: start with two-5mg  tablets twice daily for 7 days. On day 8, switch to one-5mg  tablet twice daily. 05/02/24   Gretta Lonni PARAS, MD  aspirin  EC 81 MG tablet Take 1 tablet (81 mg total) by mouth daily. Swallow whole. 04/23/24   Azobou Donley Joelle DEL, MD  atorvastatin  (LIPITOR) 40 MG tablet Take 1 tablet (40 mg total) every evening by mouth. 06/10/17   Kerman Soulier, MD  azathioprine (IMURAN) 100 MG tablet Take 100 mg by mouth daily.    [provider]  BUTRANS  20 MCG/HR PTWK patch Place 20 mcg onto the skin every Monday.     [provider]  carvedilol  (COREG ) 12.5 MG tablet Take 1 tablet (12.5 mg total) by mouth 2 (two) times daily with a meal. 04/23/24 04/23/25  Azobou Tonleu, Joelle DEL, MD  CONTOUR NEXT TEST test strip  09/19/18   [provider]  cycloSPORINE modified (NEORAL) 100 MG capsule Take 100 mg by mouth 2 (two) times daily.    [provider]  insulin  lispro (HUMALOG) 100 UNIT/ML injection See admin instructions. Uses via insulin  pump per bolus rate    [provider]  LINZESS  145 MCG CAPS capsule Take 145 mcg by mouth daily as needed (for constipation).  10/14/14   [provider]  predniSONE (DELTASONE) 10 MG tablet Take 10 mg by mouth daily.    [provider]  tamsulosin (FLOMAX) 0.4 MG CAPS capsule Take 0.4 mg by mouth 2 (two) times daily.    [provider]  thiamine  (VITAMIN B1) 100 MG tablet Take 100 mg by mouth daily.    [provider]    Allergies: Doxycycline    Review of Systems  Respiratory:  Positive for shortness of breath.     Updated Vital Signs BP 115/63 (BP Location: Right Arm)   Pulse 88   Temp 99.2 F (37.3 C) (Rectal)   Resp  20   SpO2 99%   Physical Exam Vitals and nursing note reviewed.  Constitutional:      General: He is not in acute distress.    Appearance: He is well-developed.  HENT:     Head: Normocephalic and atraumatic.  Eyes:     Conjunctiva/sclera: Conjunctivae normal.  Cardiovascular:     Rate and Rhythm: Normal rate and regular rhythm.  Pulmonary:     Effort: Tachypnea and respiratory distress present.     Breath sounds: Examination of the right-lower field reveals rales. Examination of the left-lower field reveals rales. Rales present.  Abdominal:     Palpations: Abdomen is soft.     Tenderness: There is no abdominal tenderness.  Musculoskeletal:        General: No swelling.     Cervical  back: Neck supple.     Right lower leg: No edema.     Left lower leg: No edema.  Skin:    General: Skin is warm and dry.     Capillary Refill: Capillary refill takes less than 2 seconds.  Neurological:     Mental Status: He is alert.  Psychiatric:        Mood and Affect: Mood normal.     (all labs ordered are listed, but only abnormal results are displayed) Labs Reviewed  COMPREHENSIVE METABOLIC PANEL WITH GFR - Abnormal; Notable for the following components:      Result Value   Potassium 5.8 (*)    CO2 20 (*)    Glucose, Bld 213 (*)    BUN 36 (*)    Creatinine, Ser 1.80 (*)    Calcium  8.4 (*)    Total Protein 5.6 (*)    Albumin 2.6 (*)    Total Bilirubin 5.4 (*)    GFR, Estimated 42 (*)    All other components within normal limits  BRAIN NATRIURETIC PEPTIDE - Abnormal; Notable for the following components:   B Natriuretic Peptide 456.0 (*)    All other components within normal limits  CBC WITH DIFFERENTIAL/PLATELET - Abnormal; Notable for the following components:   RDW 17.2 (*)    nRBC 0.5 (*)    Neutro Abs 8.9 (*)    Lymphs Abs 0.2 (*)    Abs Immature Granulocytes 0.13 (*)    All other components within normal limits  I-STAT CHEM 8, ED - Abnormal; Notable for the following components:   Potassium 6.1 (*)    BUN 51 (*)    Creatinine, Ser 1.60 (*)    Glucose, Bld 210 (*)    Calcium , Ion 1.06 (*)    TCO2 20 (*)    All other components within normal limits  I-STAT VENOUS BLOOD GAS, ED - Abnormal; Notable for the following components:   pCO2, Ven 42.0 (*)    Acid-base deficit 5.0 (*)    Potassium 6.1 (*)    Calcium , Ion 1.06 (*)    All other components within normal limits  I-STAT CG4 LACTIC ACID, ED - Abnormal; Notable for the following components:   Lactic Acid, Venous 2.9 (*)    All other components within normal limits  I-STAT CG4 LACTIC ACID, ED - Abnormal; Notable for the following components:   Lactic Acid, Venous 3.0 (*)    All other components within  normal limits  TROPONIN I (HIGH SENSITIVITY) - Abnormal; Notable for the following components:   Troponin I (High Sensitivity) 50 (*)    All other components within normal limits  RESP PANEL BY RT-PCR (  RSV, FLU A&B, COVID)  [id number]  CULTURE, BLOOD (ROUTINE X 2)  CULTURE, BLOOD (ROUTINE X 2)  URINALYSIS, COMPLETE (UACMP) WITH MICROSCOPIC  SODIUM, URINE, RANDOM  CREATININE, URINE, RANDOM  CYCLOSPORINE  BASIC METABOLIC PANEL WITH GFR  TROPONIN I (HIGH SENSITIVITY)    EKG: EKG Interpretation Date/Time:  Thursday July 17 2024 00:45:47 EST Ventricular Rate:  95 PR Interval:  154 QRS Duration:  94 QT Interval:  379 QTC Calculation: 477 R Axis:   89  Text Interpretation: Sinus rhythm Biatrial enlargement Anteroseptal infarct, age indeterminate When compared with ECG of 04/23/2024, Premature ventricular complexes are no longer present Confirmed by Raford Lenis ([id number]) on 07/17/2024 12:53:24 AM  Radiology: CT Angio Chest PE W and/or Wo Contrast Result Date: 07/17/2024 EXAM: CTA of the Chest with contrast for PE 07/17/2024 02:46:06 AM TECHNIQUE: CTA of the chest was performed without and with the administration of 75 mL of iohexol  (OMNIPAQUE ) 350 MG/ML injection. Multiplanar reformatted images are provided for review. MIP images are provided for review. Automated exposure control, iterative reconstruction, and/or weight based adjustment of the mA/kV was utilized to reduce the radiation dose to as low as reasonably achievable. COMPARISON: 04/29/2024 CLINICAL HISTORY: Pulmonary embolism (PE) suspected, high prob. FINDINGS: PULMONARY ARTERIES: Pulmonary arteries are adequately opacified for evaluation. No pulmonary embolism. Main pulmonary artery is normal in caliber. MEDIASTINUM: Coronary artery atherosclerosis. There is aortic atherosclerosis. LYMPH NODES: No mediastinal, hilar or axillary lymphadenopathy. LUNGS AND PLEURA: Compressive atelectasis in the lower lobes. Small to moderate bilateral  pleural effusions. No focal consolidation or pulmonary edema. No pneumothorax. UPPER ABDOMEN: Limited images of the upper abdomen are unremarkable. SOFT TISSUES AND BONES: No acute bone or soft tissue abnormality. IMPRESSION: 1. No evidence of pulmonary embolus. 2. Small to moderate bilateral pleural effusions. 3. Coronary artery disease, aortic atherosclerosis. Electronically signed by: Franky Crease MD 07/17/2024 03:01 AM EST RP Workstation: [id number]   DG Chest Port 1 View Result Date: 07/17/2024 EXAM: 1 VIEW(S) XRAY OF THE CHEST 07/17/2024 12:47:00 AM COMPARISON: PA and lateral chest 02/03/2023. CLINICAL HISTORY: Dyspnea. FINDINGS: LINES, TUBES AND DEVICES: Overlying telemetry leads. LUNGS AND PLEURA: Increased vascular engorgement, flow cephalization, and development of diffuse interstitial edema. Small pleural effusions are developing. No acute airspace infiltrate is seen. No pneumothorax. Findings consistent with Congestive Heart Failure (CHF) or fluid overload. Follow up as indicated. HEART AND MEDIASTINUM: The cardiac size is borderline prominent. The mediastinum is normally outlined. BONES AND SOFT TISSUES: No acute osseous abnormality. IMPRESSION: 1. Findings consistent with congestive heart failure or fluid overload. 2. No acute airspace infiltrate. Small pleural effusions. 3. Borderline cardiomegaly. Electronically signed by: Francis Quam MD 07/17/2024 01:05 AM EST RP Workstation: [id number]     Procedures   Medications Ordered in the ED  predniSONE (DELTASONE) tablet 10 mg (has no administration in time range)  carvedilol  (COREG ) tablet 12.5 mg (has no administration in time range)  tamsulosin (FLOMAX) capsule 0.4 mg (has no administration in time range)  apixaban  (ELIQUIS ) tablet 5 mg (has no administration in time range)  azathioprine (IMURAN) tablet 100 mg (has no administration in time range)  cycloSPORINE modified (NEORAL) capsule 100 mg (has no administration in time range)   furosemide (LASIX) injection 40 mg (has no administration in time range)  sodium chloride  flush (NS) 0.9 % injection 3 mL (has no administration in time range)  acetaminophen  (TYLENOL ) tablet 650 mg (has no administration in time range)    Or  acetaminophen  (TYLENOL ) suppository 650 mg (has no administration in time  range)  senna (SENOKOT) tablet 8.6 mg (has no administration in time range)  prochlorperazine (COMPAZINE) injection 5 mg (has no administration in time range)  insulin  pump (has no administration in time range)  furosemide (LASIX) injection 60 mg (60 mg Intravenous Given 07/17/24 0349)  iohexol  (OMNIPAQUE ) 350 MG/ML injection 75 mL (75 mLs Intravenous Contrast Given 07/17/24 0247)                                    Medical Decision Making Amount and/or Complexity of Data Reviewed Labs: ordered. Radiology: ordered.  Risk Prescription drug management. Decision regarding hospitalization.   This patient presents to the ED for concern of shortness of breath, this involves an extensive number of treatment options, and is a complaint that carries with it a high risk of complications and morbidity.  The differential diagnosis includes pneumonia, PE, dissection, CHF, viral illness, others   Co morbidities / Chronic conditions that complicate the patient evaluation  As noted in HPI, patient also has previous renal transplant   Additional history obtained:  Additional history obtained from EMR External records from outside source obtained and reviewed including echocardiogram from October 22.  Left ventricular ejection fraction was 55 to 60%.  Elevated pulmonary artery systolic pressure.  Moderate tricuspid regurg   Lab Tests:  I Ordered, and personally interpreted labs.  The pertinent results include: Lactic acid 2.9, BNP 456, troponin 50, potassium 5.8, creatinine 1.8   Imaging Studies ordered:  I ordered imaging studies including chest x-ray, CT angio chest PE  study I independently visualized and interpreted imaging which showed  1. Findings consistent with congestive heart failure or fluid overload.  2. No acute airspace infiltrate. Small pleural effusions.  3. Borderline cardiomegaly.   1. No evidence of pulmonary embolus.  2. Small to moderate bilateral pleural effusions.  3. Coronary artery disease, aortic atherosclerosis.   I agree with the radiologist interpretation   Cardiac Monitoring: / EKG:  The patient was maintained on a cardiac monitor.  I personally viewed and interpreted the cardiac monitored which showed an underlying rhythm of: Sinus rhythm   Problem List / ED Course / Critical interventions / Medication management   I ordered medication including Lasix, I also ordered the patient BiPAP.  Initially patient was ordered broad-spectrum antibiotics but upon further workup it appears to be more fluid overload and less sepsis.   Reevaluation of the patient after these medicines showed that the patient improved. Work of breathing improved prior to bipap initiation I have reviewed the patients home medicines and have made adjustments as needed   Consultations Obtained:  I requested consultation with the medicine service, Dr.Opyd, and discussed lab and imaging findings as well as pertinent plan - they recommend: admission   Social Determinants of Health:  Patient is a former smoker   Test / Admission - Considered:  Patient with evidence of new onset heart failure and significant shortness of breath requiring BiPAP.  Plan to admit to medicine for further management and likely continued diuresis. Patient had improvement of respiratory effort prior to BiPAP initiation. Patient to be admitted to medicine for further management.       Final diagnoses:  Heart failure, unspecified HF chronicity, unspecified heart failure type (HCC)  Acute respiratory failure, unspecified whether with hypoxia or hypercapnia [facility])    ED  Discharge Orders     None  Logan Ubaldo KATHEE DEVONNA 07/17/24 9380    Raford Lenis, MD 07/17/24 [phone]

## 2024-07-17 NOTE — H&P (Signed)
 History and Physical    Daniel Santos. FMW:[id number] DOB: 1960-11-24 DOA: 07/17/2024  PCP: Lenon Nell SAILOR, FNP   Patient coming from: Home   Chief Complaint: SOB   HPI: Daniel Santos. is a 63 y.o. male with medical history significant for type 1 diabetes mellitus, nonobstructive CAD, history of CVA, DVT on Eliquis , tricuspid regurgitation and pulmonary hypertension, and history of renal and pancreas transplant who presents with shortness of breath.  Patient reports chronic shortness of breath that has gradually worsened and has reached the point where he is dyspneic at rest now.  He has not been coughing much and denies fevers or chills.  Reports intermittent ankle swelling.  Reports adherence with his medications.  He was seen by cardiology for shortness of breath and had an echo less than 2 months ago with normal EF, indeterminate diastolic parameters, moderate tricuspid regurgitation, and severely elevated pulmonary artery systolic pressures.  EMS was called and found the patient to be saturating 70% on room air.  He was placed on nonrebreather and brought into the ED.  ED Course: Upon arrival to the ED, patient is found to be afebrile, tachypneic, and requiring 7 L/min of supplemental oxygen to maintain adequate saturations.  Labs are most notable for potassium 5.8, BUN 36, creatinine 1.80, albumin 2.6, total bilirubin 5.4, normal CBC, lactate 2.9, troponin 50, and BNP 456.  CTA chest is negative for PE but notable for small to moderate bilateral pleural effusions and compressive atelectasis at the bilateral bases.  Blood cultures were collected in the ED and the patient was given 60 mg IV Lasix.  Review of Systems:  All other systems reviewed and apart from HPI, are negative.  Past Medical History:  Diagnosis Date   Anemia    Atypical chest pain 12/14/2015   CKD (chronic kidney disease), stage IV (HCC) 03/23/2017   Clotting disorder    Diabetes mellitus without complication  (HCC)    Type I   Gastritis    Hemiparesis and alteration of sensations as late effects of stroke (HCC) 03/25/2015   History of non-insulin  dependent diabetes mellitus    Hypercholesteremia    Hypertension    Left arm weakness    Palpitations 12/14/2015   Stroke [facility])     Past Surgical History:  Procedure Laterality Date   AV FISTULA PLACEMENT     BASCILIC VEIN TRANSPOSITION Left 10/26/2017   Procedure: BASILIC VEIN TRANSPOSITION FIRST STAGE LEFT;  Surgeon: Oris Krystal FALCON, MD;  Location: [facility] OR;  Service: Vascular;  Laterality: Left;   BASCILIC VEIN TRANSPOSITION Left 01/18/2018   Procedure: SECOND STAGEBASILIC VEIN TRANSPOSITION LEFT ARM;  Surgeon: Oris Krystal FALCON, MD;  Location: [facility] OR;  Service: Vascular;  Laterality: Left;   COLONOSCOPY     DIRECT LARYNGOSCOPY N/A 09/24/2018   Procedure: DIRECT LARYNGOSCOPY WITH BIOPSY,BRONCHOSOPY;  Surgeon: Terri Alan PARAS, MD;  Location: [facility] OR;  Service: ENT;  Laterality: N/A;   DRAINAGE AND CLOSURE OF LYMPHOCELE Left 11/21/2017   Procedure: DRAINAGE AND CLOSURE OF LYMPHOCELE OF LEFT UPPER ARM;  Surgeon: Harvey Carlin BRAVO, MD;  Location: [facility] OR;  Service: Vascular;  Laterality: Left;   ESOPHAGOGASTRODUODENOSCOPY     LUMBAR SPINE SURGERY  2005, 2008, 2010   REPAIR OF FRACTURED PENIS  05/2000   with cystoscopy    Social History:   reports that he quit smoking about 7 years ago. His smoking use included cigarettes. He started smoking about 47 years ago. He has a 40 pack-year smoking  history. He has never used smokeless tobacco. He reports that he does not currently use alcohol. He reports that he does not use drugs.  Allergies[1]  Family History  Problem Relation Age of Onset   Cancer Mother    Leukemia Paternal Uncle    Stroke Paternal Grandmother    Diabetes Paternal Grandmother    Breast cancer Paternal Grandmother    Kidney disease Father    Diabetes Paternal Aunt        x2   Breast cancer Maternal Grandmother    Colon cancer Neg Hx     Esophageal cancer Neg Hx    Stomach cancer Neg Hx    Rectal cancer Neg Hx    Colon polyps Neg Hx      Prior to Admission medications  Medication Sig Start Date End Date Taking? Authorizing Provider  acetaminophen  (TYLENOL ) 500 MG tablet Take 1,000 mg by mouth every 6 (six) hours as needed (pain).    [provider]  apixaban  (ELIQUIS ) 5 MG TABS tablet Take 1 tablet (5 mg total) by mouth 2 (two) times daily. Start taking after completion of starter pack. 05/02/24   Gretta Lonni PARAS, MD  Apixaban  Starter Pack, 10mg  and 5mg , (ELIQUIS  DVT/PE STARTER PACK) Take as directed on package: start with two-5mg  tablets twice daily for 7 days. On day 8, switch to one-5mg  tablet twice daily. 05/02/24   Gretta Lonni PARAS, MD  aspirin  EC 81 MG tablet Take 1 tablet (81 mg total) by mouth daily. Swallow whole. 04/23/24   Azobou Donley Joelle DEL, MD  atorvastatin  (LIPITOR) 40 MG tablet Take 1 tablet (40 mg total) every evening by mouth. 06/10/17   Kerman Soulier, MD  azathioprine (IMURAN) 100 MG tablet Take 100 mg by mouth daily.    [provider]  BUTRANS  20 MCG/HR PTWK patch Place 20 mcg onto the skin every Monday.     [provider]  carvedilol  (COREG ) 12.5 MG tablet Take 1 tablet (12.5 mg total) by mouth 2 (two) times daily with a meal. 04/23/24 04/23/25  Azobou Tonleu, Joelle DEL, MD  CONTOUR NEXT TEST test strip  09/19/18   [provider]  cycloSPORINE modified (NEORAL) 100 MG capsule Take 100 mg by mouth 2 (two) times daily.    [provider]  insulin  lispro (HUMALOG) 100 UNIT/ML injection See admin instructions. Uses via insulin  pump per bolus rate    [provider]  LINZESS  145 MCG CAPS capsule Take 145 mcg by mouth daily as needed (for constipation).  10/14/14   [provider]  predniSONE (DELTASONE) 10 MG tablet Take 10 mg by mouth daily.    [provider]  tamsulosin (FLOMAX) 0.4 MG CAPS capsule Take 0.4 mg by mouth 2 (two)  times daily.    [provider]  thiamine  (VITAMIN B1) 100 MG tablet Take 100 mg by mouth daily.    [provider]    Physical Exam: Vitals:   07/17/24 0048 07/17/24 0200  BP: (!) 133/107 115/63  Pulse: 93 88  Resp: (!) 33 20  Temp: 97.6 F (36.4 C) 99.2 F (37.3 C)  TempSrc: Oral Rectal  SpO2: 97% 99%    Constitutional: NAD, no pallor or diaphoresis   Eyes: PERTLA, lids and conjunctivae normal ENMT: Mucous membranes are moist. Posterior pharynx clear of any exudate or lesions.   Neck: supple, no masses  Respiratory: Dyspneic with speech. No wheezing.  Cardiovascular: S1 & S2 heard, regular rate and rhythm. Mild b/l ankle edema.  Abdomen: No tenderness, soft. Bowel sounds active.  Musculoskeletal: no clubbing / cyanosis. No joint deformity upper and lower extremities.   Skin: no significant rashes, lesions, ulcers. Warm, dry, well-perfused. Neurologic: CN 2-12 grossly intact. Moving all extremities. Alert and oriented.  Psychiatric: Calm. Cooperative.    Labs and Imaging on Admission: I have personally reviewed following labs and imaging studies  CBC: Recent Labs  Lab 07/17/24 0112 07/17/24 0122  WBC 9.9  --   NEUTROABS 8.9*  --   HGB 14.7 16.3  16.3  HCT 47.3 48.0  48.0  MCV 86.5  --   PLT 202  --    Basic Metabolic Panel: Recent Labs  Lab 07/17/24 0112 07/17/24 0122  NA 137 136  137  K 5.8* 6.1*  6.1*  CL 105 108  CO2 20*  --   GLUCOSE 213* 210*  BUN 36* 51*  CREATININE 1.80* 1.60*  CALCIUM  8.4*  --    GFR: CrCl cannot be calculated (Unknown ideal weight.). Liver Function Tests: Recent Labs  Lab 07/17/24 0112  AST 24  ALT 18  ALKPHOS 65  BILITOT 5.4*  PROT 5.6*  ALBUMIN 2.6*   No results for input(s): LIPASE, AMYLASE in the last 168 hours. No results for input(s): AMMONIA in the last 168 hours. Coagulation Profile: No results for input(s): INR, PROTIME in the last 168 hours. Cardiac Enzymes: No results for  input(s): CKTOTAL, CKMB, CKMBINDEX, TROPONINI in the last 168 hours. BNP (last 3 results) No results for input(s): PROBNP in the last 8760 hours. HbA1C: No results for input(s): HGBA1C in the last 72 hours. CBG: No results for input(s): GLUCAP in the last 168 hours. Lipid Profile: No results for input(s): CHOL, HDL, LDLCALC, TRIG, CHOLHDL, LDLDIRECT in the last 72 hours. Thyroid Function Tests: No results for input(s): TSH, T4TOTAL, FREET4, T3FREE, THYROIDAB in the last 72 hours. Anemia Panel: No results for input(s): VITAMINB12, FOLATE, FERRITIN, TIBC, IRON, RETICCTPCT in the last 72 hours. Urine analysis:    Component Value Date/Time   COLORURINE YELLOW 06/09/2017 1104   APPEARANCEUR CLEAR 06/09/2017 1104   LABSPEC 1.009 06/09/2017 1104   PHURINE 5.0 06/09/2017 1104   GLUCOSEU NEGATIVE 06/09/2017 1104   HGBUR NEGATIVE 06/09/2017 1104   BILIRUBINUR NEGATIVE 06/09/2017 1104   KETONESUR NEGATIVE 06/09/2017 1104   PROTEINUR 100 (A) 06/09/2017 1104   UROBILINOGEN 0.2 11/17/2008 0839   NITRITE NEGATIVE 06/09/2017 1104   LEUKOCYTESUR NEGATIVE 06/09/2017 1104   Sepsis Labs: @LABRCNTIP (procalcitonin:4,lacticidven:4) ) Recent Results (from the past 240 hours)  Resp panel by RT-PCR (RSV, Flu A&B, Covid) Anterior Nasal Swab     Status: None   Collection Time: 07/17/24 12:41 AM   Specimen: Anterior Nasal Swab  Result Value Ref Range Status   SARS Coronavirus 2 by RT PCR NEGATIVE NEGATIVE Final   Influenza A by PCR NEGATIVE NEGATIVE Final   Influenza B by PCR NEGATIVE NEGATIVE Final    Comment: (NOTE) The Xpert Xpress SARS-CoV-2/FLU/RSV plus assay is intended as an aid in the diagnosis of influenza from Nasopharyngeal swab specimens and should not be used as a sole basis for treatment. Nasal washings and aspirates are unacceptable for Xpert Xpress SARS-CoV-2/FLU/RSV testing.  Fact Sheet for  Patients: bloggercourse.com  Fact Sheet for Healthcare Providers: seriousbroker.it  This test is not yet approved or cleared by the [place]  FDA and has been authorized for detection and/or diagnosis of SARS-CoV-2 by FDA under an Emergency Use Authorization (EUA). This EUA will remain in effect (meaning this test  can be used) for the duration of the COVID-19 declaration under Section 564(b)(1) of the Act, 21 U.S.C. section 360bbb-3(b)(1), unless the authorization is terminated or revoked.     Resp Syncytial Virus by PCR NEGATIVE NEGATIVE Final    Comment: (NOTE) Fact Sheet for Patients: bloggercourse.com  Fact Sheet for Healthcare Providers: seriousbroker.it  This test is not yet approved or cleared by the [place]  FDA and has been authorized for detection and/or diagnosis of SARS-CoV-2 by FDA under an Emergency Use Authorization (EUA). This EUA will remain in effect (meaning this test can be used) for the duration of the COVID-19 declaration under Section 564(b)(1) of the Act, 21 U.S.C. section 360bbb-3(b)(1), unless the authorization is terminated or revoked.  Performed at [facility], [address], [place], [place] [address]   Blood culture (routine x 2)     Status: None (Preliminary result)   Collection Time: 07/17/24  2:13 AM   Specimen: BLOOD RIGHT HAND  Result Value Ref Range Status   Specimen Description BLOOD RIGHT HAND  Final   Special Requests   Final    BOTTLES DRAWN AEROBIC ONLY Blood Culture results may not be optimal due to an inadequate volume of blood received in culture bottles Performed at [facility], [address], [place], [place] [address]    Culture PENDING  Incomplete   Report Status PENDING  Incomplete     Radiological Exams on Admission: CT Angio Chest PE W and/or Wo Contrast Result Date: 07/17/2024 EXAM: CTA of the  Chest with contrast for PE 07/17/2024 02:46:06 AM TECHNIQUE: CTA of the chest was performed without and with the administration of 75 mL of iohexol  (OMNIPAQUE ) 350 MG/ML injection. Multiplanar reformatted images are provided for review. MIP images are provided for review. Automated exposure control, iterative reconstruction, and/or weight based adjustment of the mA/kV was utilized to reduce the radiation dose to as low as reasonably achievable. COMPARISON: 04/29/2024 CLINICAL HISTORY: Pulmonary embolism (PE) suspected, high prob. FINDINGS: PULMONARY ARTERIES: Pulmonary arteries are adequately opacified for evaluation. No pulmonary embolism. Main pulmonary artery is normal in caliber. MEDIASTINUM: Coronary artery atherosclerosis. There is aortic atherosclerosis. LYMPH NODES: No mediastinal, hilar or axillary lymphadenopathy. LUNGS AND PLEURA: Compressive atelectasis in the lower lobes. Small to moderate bilateral pleural effusions. No focal consolidation or pulmonary edema. No pneumothorax. UPPER ABDOMEN: Limited images of the upper abdomen are unremarkable. SOFT TISSUES AND BONES: No acute bone or soft tissue abnormality. IMPRESSION: 1. No evidence of pulmonary embolus. 2. Small to moderate bilateral pleural effusions. 3. Coronary artery disease, aortic atherosclerosis. Electronically signed by: Franky Crease MD 07/17/2024 03:01 AM EST RP Workstation: [id number]   DG Chest Port 1 View Result Date: 07/17/2024 EXAM: 1 VIEW(S) XRAY OF THE CHEST 07/17/2024 12:47:00 AM COMPARISON: PA and lateral chest 02/03/2023. CLINICAL HISTORY: Dyspnea. FINDINGS: LINES, TUBES AND DEVICES: Overlying telemetry leads. LUNGS AND PLEURA: Increased vascular engorgement, flow cephalization, and development of diffuse interstitial edema. Small pleural effusions are developing. No acute airspace infiltrate is seen. No pneumothorax. Findings consistent with Congestive Heart Failure (CHF) or fluid overload. Follow up as indicated. HEART AND  MEDIASTINUM: The cardiac size is borderline prominent. The mediastinum is normally outlined. BONES AND SOFT TISSUES: No acute osseous abnormality. IMPRESSION: 1. Findings consistent with congestive heart failure or fluid overload. 2. No acute airspace infiltrate. Small pleural effusions. 3. Borderline cardiomegaly. Electronically signed by: Francis Quam MD 07/17/2024 01:05 AM EST RP Workstation: [id number]    EKG: Independently reviewed. Sinus rhythm.  Assessment/Plan   1. Acute on chronic HFpEF; acute hypoxic respiratory failure  - Continue diuresis with IV Lasix, monitor I/Os and daily weights, continue Coreg  as tolerated, continue supplemental O2 as-needed    2. AKI; hyperkalemia; hx of renal transplant  - SCr is 1.80, up from 1.40 in September 2025 and previously stable at ~1.1 - Serum potassium is 5.8, anticipate improvement with Lasix   - Check cyclosporine level, UA with microscopy, and renal US  with Doppler, continue cyclosporine, azathioprine, and prednisone, renally-dose medications, follow daily chem panel while diuresing     3. Type I DM  - Continue insulin  pump    4. Hx of DVT  - Continue Eliquis     5. Hx of CVA  - Eliquis , Lipitor    6. Chronic urinary retention  - Continue clean intermittent catheterization   7. 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

## 2024-07-17 NOTE — Progress Notes (Signed)
 Heart Failure Navigator Progress Note  Assessed for Heart & Vascular TOC clinic readiness.  Patient does not meet criteria due to last EF 55-60%, has a scheduled [facility] appointment on 07/24/2024. No HF TOC. .   Navigator will sign off at this time.   Stephane Haddock, BSN, Scientist, Clinical (histocompatibility And Immunogenetics) Only

## 2024-07-17 NOTE — Inpatient Diabetes Management (Addendum)
 Inpatient Diabetes Program Recommendations  AACE/ADA: New Consensus Statement on Inpatient Glycemic Control (2015)  Target Ranges:  Prepandial:   less than 140 mg/dL      Peak postprandial:   less than 180 mg/dL (1-2 hours)      Critically ill patients:  140 - 180 mg/dL   Lab Results  Component Value Date   GLUCAP 209 (H) 07/17/2024   HGBA1C 7.7 (H) 10/26/2017    Review of Glycemic Control  Latest Reference Range & Units 07/17/24 07:52  Glucose-Capillary 70 - 99 mg/dL 790 (H)   Diabetes history: DM 1 (hx. Of kidney/pancreatic transplant in 2021) Outpatient Diabetes medications:  Medtronic insulin  pump:  Per Dr. Balan, insulin  pump settings are:  MN-0.3 units/hr 6a- 0.375 units/ hr 9p- 0.35 units/hr CHO ratio: 1 unit/9 grams of CHO Sensitivity:  MN- 90 mg/dL 5p- 80 mg/dL Current orders for Inpatient glycemic control:  Insulin  pump- Prednisone 10 mg daily  Inpatient Diabetes Program Recommendations:    Note insulin  pump orders.  Will follow.   1440- addendum:  Spoke with Dr. Balan (endocrinology) by phone.  She verified insulin  pump settings per above.  It appears patient is very sensitive to insulin  doses.  If insulin  pump needs to be removed, recommend Novolog 0-6 units (very sensitive) q 4 hours plus Lantus 6 units daily.  Once eating, will also need low dose meal coverage (2-3 units tid with meals- hold if patient eats less than 50% or NPO).   Thanks,  Randall Bullocks, RN, BC-ADM Inpatient Diabetes Coordinator Pager [phone]  (8a-5p)

## 2024-07-17 NOTE — Consult Note (Addendum)
 Advanced Heart Failure Team Consult Note  Primary Physician: Lenon Nell SAILOR, FNP Cardiologist:  None  Reason for Consultation: Pulmonary Hypertension HPI:    Daniel Santos. is seen today for evaluation of pulmonary hypertension at the request of Dr. Arlice.   63 y.o. 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   Found to have DVT in 9/25 to the RLE. He was referred to DVT Clinic and was started on eliquis .   Echo 4/21 EF 60-65%, normal RV function with moderate pulmonary edema, RVSP 49  Was seen by [facility] for shortness of breath that has been getting worse over the past year. He has NYHA III-IV at home. He was scheduled for CTA and echo.   Presented to ED by EMS for hypoxia with sats in 70s on room air. He has been having progressive dyspnea, now at rest. In ED, BP 133/107, HR 90s, RR 33, requiring NRB. ECG showed SR 98 bpm.  CXR with hyperinflated lungs. CTPE negative for PE. Labs notable for K 5.8, BUN/Cr 36/1.8, ical 1.06, BNP 456, hs-trop 50>118, LA 2.9>3.0.  Today, remains on NRS. O2sats down in 80s with conversation. He has been struggling to get around his house lately due to shortness of breath. He has been getting progressively more weak. Reports that he had chest discomfort overnight due to deep breathing.   Home Medications Prior to Admission medications  Medication Sig Start Date End Date Taking? Authorizing Provider  BACTRIM 400-80 MG tablet Take 1 tablet 3 times a week by oral route as directed for 90 days, for M, W, F. 03/17/22  Yes [provider]  acetaminophen  (TYLENOL ) 500 MG tablet Take 1,000 mg by mouth every 6 (six) hours as needed (pain).    [provider]  apixaban  (ELIQUIS ) 5 MG TABS tablet Take 1 tablet (5 mg total) by mouth 2 (two) times daily. Start taking after  completion of starter pack. 05/02/24   Gretta Lonni PARAS, MD  Apixaban  Starter Pack, 10mg  and 5mg , (ELIQUIS  DVT/PE STARTER PACK) Take as directed on package: start with two-5mg  tablets twice daily for 7 days. On day 8, switch to one-5mg  tablet twice daily. 05/02/24   Gretta Lonni PARAS, MD  aspirin  EC 81 MG tablet Take 1 tablet (81 mg total) by mouth daily. Swallow whole. 04/23/24   Azobou Donley Joelle DEL, MD  atorvastatin  (LIPITOR) 40 MG tablet Take 1 tablet (40 mg total) every evening by mouth. 06/10/17   Kerman Soulier, MD  azathioprine (IMURAN) 100 MG tablet Take 100 mg by mouth daily.    [provider]  BUTRANS  20 MCG/HR PTWK patch Place 20 mcg onto the skin every Monday.     [provider]  carvedilol  (COREG ) 12.5 MG tablet Take 1 tablet (12.5 mg total) by mouth 2 (two) times daily with a meal. 04/23/24 04/23/25  Azobou Tonleu, Joelle DEL, MD  cycloSPORINE modified (NEORAL) 100 MG capsule Take 100 mg by mouth 2 (two) times daily.    [provider]  insulin  lispro (HUMALOG) 100 UNIT/ML injection See admin instructions. Uses via insulin  pump per bolus rate    [provider]  LINZESS  145 MCG CAPS capsule Take 145 mcg by mouth daily as needed (for constipation).  10/14/14   [provider]  predniSONE (DELTASONE) 10 MG tablet Take 10 mg by  mouth daily.    [provider]  tamsulosin (FLOMAX) 0.4 MG CAPS capsule Take 0.4 mg by mouth 2 (two) times daily.    [provider]  thiamine  (VITAMIN B1) 100 MG tablet Take 100 mg by mouth daily.    [provider]    Past Medical History: 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) 03/25/2015   History of non-insulin   dependent diabetes mellitus    Hypercholesteremia    Hypertension    Left arm weakness    Palpitations 12/14/2015   Stroke [facility])     Past Surgical History: Past Surgical History:  Procedure Laterality Date   AV FISTULA PLACEMENT     BASCILIC VEIN TRANSPOSITION Left 10/26/2017   Procedure: BASILIC VEIN TRANSPOSITION FIRST STAGE LEFT;  Surgeon: Oris Krystal FALCON, MD;  Location: [facility] OR;  Service: Vascular;  Laterality: Left;   BASCILIC VEIN TRANSPOSITION Left 01/18/2018   Procedure: SECOND STAGEBASILIC VEIN TRANSPOSITION LEFT ARM;  Surgeon: Oris Krystal FALCON, MD;  Location: [facility] OR;  Service: Vascular;  Laterality: Left;   COLONOSCOPY     DIRECT LARYNGOSCOPY N/A 09/24/2018   Procedure: DIRECT LARYNGOSCOPY WITH BIOPSY,BRONCHOSOPY;  Surgeon: Terri Alan PARAS, MD;  Location: [facility] OR;  Service: ENT;  Laterality: N/A;   DRAINAGE AND CLOSURE OF LYMPHOCELE Left 11/21/2017   Procedure: DRAINAGE AND CLOSURE OF LYMPHOCELE OF LEFT UPPER ARM;  Surgeon: Harvey Carlin BRAVO, MD;  Location: [facility] OR;  Service: Vascular;  Laterality: Left;   ESOPHAGOGASTRODUODENOSCOPY     LUMBAR SPINE SURGERY  2005, 2008, 2010   REPAIR OF FRACTURED PENIS  05/2000   with cystoscopy    Family History: Family History  Problem Relation Age of Onset   Cancer Mother    Leukemia Paternal Uncle    Stroke Paternal Grandmother    Diabetes Paternal Grandmother    Breast cancer Paternal Grandmother    Kidney disease Father    Diabetes Paternal Aunt        x2   Breast cancer Maternal Grandmother    Colon cancer Neg Hx    Esophageal cancer Neg Hx    Stomach cancer Neg Hx    Rectal cancer Neg Hx    Colon polyps Neg Hx     Social History: Social History   Socioeconomic History   Marital status: Single    Spouse name: Not on file   Number of children: 3   Years of education: 12   Highest education level: Not on file  Occupational History   Not on file  Tobacco Use   Smoking status: Former    Current packs/day: 0.00    Average  packs/day: 1 pack/day for 40.0 years (40.0 ttl pk-yrs)    Types: Cigarettes    Start date: 06/06/1977    Quit date: 06/06/2017    Years since quitting: 7.1   Smokeless tobacco: Never  Vaping Use   Vaping status: Never Used  Substance and Sexual Activity   Alcohol use: Not Currently    Comment: 2 X A YEAR   Drug use: No   Sexual activity: Not on file  Other Topics Concern   Not on file  Social History Narrative   Patient drinks  about 2 cups of caffeine daily.   Patient is right handed.   Social Drivers of Health   Tobacco Use: Medium Risk (07/17/2024)   Patient History    Smoking Tobacco Use: Former    Smokeless Tobacco Use: Never    Passive Exposure: Not on Actuary Strain: Not on file  Food Insecurity: Not on file  Transportation Needs: Not on file  Physical Activity: Not on file  Stress: Not on file  Social Connections: Not on file  Depression (EYV7-0): Not on file  Alcohol Screen: Not on file  Housing: Not on file  Utilities: Not on file  Health Literacy: Not on file   Allergies:  Allergies[1]  Objective:    Vital Signs:   Temp:  [97.6 F (36.4 C)-99.2 F (37.3 C)] 98.7 F (37.1 C) (12/11 1100) Pulse Rate:  [85-97] 85 (12/11 1100) Resp:  [20-33] 23 (12/11 1100) BP: (107-151)/(61-107) 120/61 (12/11 1100) SpO2:  [94 %-100 %] 94 % (12/11 1100)   Weight change: There were no vitals filed for this visit.  Intake/Output:  Intake/Output Summary (Last 24 hours) at 07/17/2024 1156 Last data filed at 07/17/2024 0707 Gross per 24 hour  Intake --  Output 800 ml  Net -800 ml   Physical Exam    General: Labored appearing.  Cardiac: Regular rhythm. No murmurs  Extremities: Cool and dry. No peripheral edema.  Neuro: A&O x3. Affect pleasant.   Telemetry   SR 80s (personally reviewed)  Labs   Basic Metabolic Panel: Recent Labs  Lab 07/17/24 0112 07/17/24 0122 07/17/24 0915  NA 137 136  137 136  K 5.8* 6.1*  6.1* 5.6*  CL 105 108  105  CO2 20*  --  17*  GLUCOSE 213* 210* 213*  BUN 36* 51* 43*  CREATININE 1.80* 1.60* 1.96*  CALCIUM  8.4*  --  8.0*   Liver Function Tests: Recent Labs  Lab 07/17/24 0112  AST 24  ALT 18  ALKPHOS 65  BILITOT 5.4*  PROT 5.6*  ALBUMIN 2.6*   No results for input(s): LIPASE, AMYLASE in the last 168 hours. No results for input(s): AMMONIA in the last 168 hours.  CBC: Recent Labs  Lab 07/17/24 0112 07/17/24 0122  WBC 9.9  --   NEUTROABS 8.9*  --   HGB 14.7 16.3  16.3  HCT 47.3 48.0  48.0  MCV 86.5  --   PLT 202  --    BNP (last 3 results) Recent Labs    07/17/24 0112  BNP 456.0*   CBG: Recent Labs  Lab 07/17/24 0752 07/17/24 1151  GLUCAP 209* 229*   Medications:    Current Medications:  apixaban   5 mg Oral BID   azaTHIOprine  100 mg Oral Daily   carvedilol   12.5 mg Oral BID WC   cycloSPORINE modified  100 mg Oral BID   furosemide  40 mg Intravenous BID   insulin  pump   Subcutaneous TID WC, HS, 0200   predniSONE  10 mg Oral Daily   sodium chloride  flush  3 mL Intravenous Q12H   tamsulosin  0.4 mg Oral BID    Infusions:   Assessment/Plan   Pulmonary Hypertension with Cor Pulmonale and Acute Hypoxic Respiratory Failure:  - Progressive and limiting shortness of breath and fatigue over the past year. Now requiring NRB, desaturations with conversation. CTA negative for PE, has no history of prior Pes. He does have emphysema, long history of smoking. - Echo 4/21: mild pHTN with mild RV  dysfunction, RVSP 49 - RHC 5/21: RA 7, PA 34/8, PCW 13, co/ci 4.67/2.69 - Echo 10/25: EF 55-60% severely elevated PA pressures, mod TR, RVSP 61 - NYHA IV on admit. Appears volume up - on IV Lasix 40 mg bid, increase pending cath results - may need inotrope for right sided reports - repeat RHC today  CAD - coronary CTA 9/25: calcium  score 315 with mild nonobstructive CAD - continue atorva 40 mg daily - on eliquis   CKD 3b - h/o kidney tx - Cr 1.8>1.6>1.96 -  avoid hypotension  DVT - US  9/25: acute deep DVT in common femoral and superficial DVT in the great saphenous - on eliquis   PFO  - noted on CTA  H/o CVA  H/o Dual Organ Transplant: pancreas and kidney in 2021  Length of Stay: 0  Adrienne Trombetta, NP  07/17/2024, 11:56 AM  Advanced Heart Failure Team Pager [phone] (M-F; 7a - 5p)  Please contact [facility] for night-coverage after hours (4p -7a ) and weekends on [email or web address]     [1]  Allergies Allergen Reactions   Doxycycline Nausea Only and Other (See Comments)    GI upset, kidney function

## 2024-07-18 DIAGNOSIS — I272 Pulmonary hypertension, unspecified: Secondary | ICD-10-CM | POA: Insufficient documentation

## 2024-07-18 DIAGNOSIS — J9600 Acute respiratory failure, unspecified whether with hypoxia or hypercapnia: Secondary | ICD-10-CM | POA: Insufficient documentation

## 2024-07-18 LAB — BETA-HYDROXYBUTYRIC ACID
Beta-Hydroxybutyric Acid: 1.59 mmol/L — ABNORMAL HIGH (ref 0.05–0.27)
Beta-Hydroxybutyric Acid: 0.21 mmol/L (ref 0.05–0.27)
Beta-Hydroxybutyric Acid: 0.1 mmol/L (ref 0.05–0.27)
Beta-Hydroxybutyric Acid: 0.1 mmol/L (ref 0.05–0.27)

## 2024-07-18 LAB — PHOSPHORUS: Phosphorus: 5 mg/dL — ABNORMAL HIGH (ref 2.5–4.6)

## 2024-07-18 LAB — BASIC METABOLIC PANEL WITH GFR
Anion gap: 11 (ref 5–15)
Anion gap: 12 (ref 5–15)
Anion gap: 11 (ref 5–15)
Anion gap: 10 (ref 5–15)
BUN: 66 mg/dL — ABNORMAL HIGH (ref 8–23)
BUN: 68 mg/dL — ABNORMAL HIGH (ref 8–23)
BUN: 66 mg/dL — ABNORMAL HIGH (ref 8–23)
BUN: 69 mg/dL — ABNORMAL HIGH (ref 8–23)
CO2: 19 mmol/L — ABNORMAL LOW (ref 22–32)
CO2: 20 mmol/L — ABNORMAL LOW (ref 22–32)
CO2: 20 mmol/L — ABNORMAL LOW (ref 22–32)
CO2: 24 mmol/L (ref 22–32)
Calcium: 7.6 mg/dL — ABNORMAL LOW (ref 8.9–10.3)
Calcium: 7.6 mg/dL — ABNORMAL LOW (ref 8.9–10.3)
Calcium: 7.9 mg/dL — ABNORMAL LOW (ref 8.9–10.3)
Calcium: 7.8 mg/dL — ABNORMAL LOW (ref 8.9–10.3)
Chloride: 101 mmol/L (ref 98–111)
Chloride: 100 mmol/L (ref 98–111)
Chloride: 101 mmol/L (ref 98–111)
Chloride: 103 mmol/L (ref 98–111)
Creatinine, Ser: 2.72 mg/dL — ABNORMAL HIGH (ref 0.61–1.24)
Creatinine, Ser: 2.49 mg/dL — ABNORMAL HIGH (ref 0.61–1.24)
Creatinine, Ser: 2.65 mg/dL — ABNORMAL HIGH (ref 0.61–1.24)
Creatinine, Ser: 2.52 mg/dL — ABNORMAL HIGH (ref 0.61–1.24)
GFR, Estimated: 25 mL/min — ABNORMAL LOW (ref >60)
GFR, Estimated: 28 mL/min — ABNORMAL LOW (ref >60)
GFR, Estimated: 26 mL/min — ABNORMAL LOW (ref >60)
GFR, Estimated: 28 mL/min — ABNORMAL LOW (ref >60)
Glucose, Bld: 468 mg/dL — ABNORMAL HIGH (ref 70–99)
Glucose, Bld: 308 mg/dL — ABNORMAL HIGH (ref 70–99)
Glucose, Bld: 215 mg/dL — ABNORMAL HIGH (ref 70–99)
Glucose, Bld: 98 mg/dL (ref 70–99)
Potassium: 5.4 mmol/L — ABNORMAL HIGH (ref 3.5–5.1)
Potassium: 4.2 mmol/L (ref 3.5–5.1)
Potassium: 3.9 mmol/L (ref 3.5–5.1)
Potassium: 4 mmol/L (ref 3.5–5.1)
Sodium: 131 mmol/L — ABNORMAL LOW (ref 135–145)
Sodium: 132 mmol/L — ABNORMAL LOW (ref 135–145)
Sodium: 132 mmol/L — ABNORMAL LOW (ref 135–145)
Sodium: 137 mmol/L (ref 135–145)

## 2024-07-18 LAB — GLUCOSE, CAPILLARY
Glucose-Capillary: 101 mg/dL — ABNORMAL HIGH (ref 70–99)
Glucose-Capillary: 113 mg/dL — ABNORMAL HIGH (ref 70–99)
Glucose-Capillary: 163 mg/dL — ABNORMAL HIGH (ref 70–99)
Glucose-Capillary: 209 mg/dL — ABNORMAL HIGH (ref 70–99)
Glucose-Capillary: 285 mg/dL — ABNORMAL HIGH (ref 70–99)
Glucose-Capillary: 285 mg/dL — ABNORMAL HIGH (ref 70–99)
Glucose-Capillary: 347 mg/dL — ABNORMAL HIGH (ref 70–99)
Glucose-Capillary: 357 mg/dL — ABNORMAL HIGH (ref 70–99)
Glucose-Capillary: 378 mg/dL — ABNORMAL HIGH (ref 70–99)
Glucose-Capillary: 458 mg/dL — ABNORMAL HIGH (ref 70–99)
Glucose-Capillary: 77 mg/dL (ref 70–99)
Glucose-Capillary: 78 mg/dL (ref 70–99)
Glucose-Capillary: 79 mg/dL (ref 70–99)
Glucose-Capillary: 83 mg/dL (ref 70–99)
Glucose-Capillary: 84 mg/dL (ref 70–99)
Glucose-Capillary: 96 mg/dL (ref 70–99)

## 2024-07-18 LAB — MAGNESIUM: Magnesium: 1.7 mg/dL (ref 1.7–2.4)

## 2024-07-18 LAB — CBC
HCT: 39.9 % (ref 39.0–52.0)
Hemoglobin: 12.7 g/dL — ABNORMAL LOW (ref 13.0–17.0)
MCH: 26.7 pg (ref 26.0–34.0)
MCHC: 31.8 g/dL (ref 30.0–36.0)
MCV: 83.8 fL (ref 80.0–100.0)
Platelets: 133 K/uL — ABNORMAL LOW (ref 150–400)
RBC: 4.76 MIL/uL (ref 4.22–5.81)
RDW: 16.2 % — ABNORMAL HIGH (ref 11.5–15.5)
WBC: 7.8 K/uL (ref 4.0–10.5)
nRBC: 0.6 % — ABNORMAL HIGH (ref 0.0–0.2)

## 2024-07-18 LAB — HEPATIC FUNCTION PANEL
ALT: 16 U/L (ref 0–44)
AST: 16 U/L (ref 15–41)
Albumin: 2.3 g/dL — ABNORMAL LOW (ref 3.5–5.0)
Alkaline Phosphatase: 58 U/L (ref 38–126)
Bilirubin, Direct: 1.3 mg/dL — ABNORMAL HIGH (ref 0.0–0.2)
Indirect Bilirubin: 3.7 mg/dL — ABNORMAL HIGH (ref 0.3–0.9)
Total Bilirubin: 5 mg/dL — ABNORMAL HIGH (ref 0.0–1.2)
Total Protein: 5.1 g/dL — ABNORMAL LOW (ref 6.5–8.1)

## 2024-07-18 LAB — LACTIC ACID, PLASMA: Lactic Acid, Venous: 1 mmol/L (ref 0.5–1.9)

## 2024-07-18 LAB — BRAIN NATRIURETIC PEPTIDE: B Natriuretic Peptide: 378.5 pg/mL — ABNORMAL HIGH (ref 0.0–100.0)

## 2024-07-18 LAB — HIV ANTIBODY (ROUTINE TESTING W REFLEX): HIV Screen 4th Generation wRfx: NONREACTIVE

## 2024-07-18 MED ORDER — INSULIN GLARGINE 100 UNIT/ML ~~LOC~~ SOLN
8.0000 [IU] | Freq: Once | SUBCUTANEOUS | Status: AC
  Administered 2024-07-18: 8 [IU] via SUBCUTANEOUS
  Filled 2024-07-18: qty 0.08

## 2024-07-18 MED ORDER — LACTATED RINGERS IV BOLUS
250.0000 mL | Freq: Once | INTRAVENOUS | Status: AC
  Administered 2024-07-18: 250 mL via INTRAVENOUS

## 2024-07-18 MED ORDER — INSULIN (MYXREDLIN) INFUSION FOR HYPERTRIGLYCERIDEMIA
0.1000 [IU]/kg/h | INTRAVENOUS | Status: DC
  Administered 2024-07-18: 0.1 [IU]/kg/h via INTRAVENOUS
  Filled 2024-07-18: qty 100

## 2024-07-18 MED ORDER — SODIUM CHLORIDE 0.9 % IV SOLN
100.0000 mg | Freq: Two times a day (BID) | INTRAVENOUS | Status: DC
  Administered 2024-07-18: 100 mg via INTRAVENOUS
  Filled 2024-07-18 (×4): qty 100

## 2024-07-18 MED ORDER — INSULIN GLARGINE 100 UNIT/ML ~~LOC~~ SOLN: 14.0000 [IU] | Freq: Every day | SUBCUTANEOUS | Status: DC

## 2024-07-18 MED ORDER — INSULIN ASPART 100 UNIT/ML IJ SOLN: 2.0000 [IU] | INTRAMUSCULAR | Status: DC

## 2024-07-18 MED ORDER — INSULIN GLARGINE 100 UNIT/ML ~~LOC~~ SOLN
12.0000 [IU] | Freq: Every day | SUBCUTANEOUS | Status: DC
  Administered 2024-07-18: 12 [IU] via SUBCUTANEOUS
  Filled 2024-07-18 (×2): qty 0.12

## 2024-07-18 MED ORDER — INSULIN ASPART 100 UNIT/ML IJ SOLN: 0.0000 [IU] | INTRAMUSCULAR | Status: DC

## 2024-07-18 MED ORDER — DEXTROSE 50 % IV SOLN: 0.0000 mL | INTRAVENOUS | Status: DC | PRN

## 2024-07-18 MED ORDER — INSULIN ASPART 100 UNIT/ML IJ SOLN
2.0000 [IU] | INTRAMUSCULAR | Status: DC
  Administered 2024-07-19: 6 [IU] via SUBCUTANEOUS
  Filled 2024-07-18: qty 6

## 2024-07-18 MED ORDER — MAGNESIUM SULFATE 2 GM/50ML IV SOLN
2.0000 g | Freq: Once | INTRAVENOUS | Status: AC
  Administered 2024-07-18: 2 g via INTRAVENOUS
  Filled 2024-07-18: qty 50

## 2024-07-18 MED ORDER — SODIUM ZIRCONIUM CYCLOSILICATE 10 G PO PACK
10.0000 g | PACK | Freq: Every day | ORAL | Status: DC
  Administered 2024-07-18: 10 g
  Filled 2024-07-18: qty 1

## 2024-07-18 MED ORDER — INSULIN GLARGINE 100 UNIT/ML ~~LOC~~ SOLN
6.0000 [IU] | Freq: Every day | SUBCUTANEOUS | Status: DC
  Administered 2024-07-18: 6 [IU] via SUBCUTANEOUS
  Filled 2024-07-18 (×2): qty 0.06

## 2024-07-18 MED ORDER — INSULIN ASPART 100 UNIT/ML IJ SOLN
2.0000 [IU] | Freq: Three times a day (TID) | INTRAMUSCULAR | Status: DC
  Administered 2024-07-18: 2 [IU] via SUBCUTANEOUS
  Filled 2024-07-18: qty 2

## 2024-07-18 MED ORDER — INSULIN ASPART 100 UNIT/ML IJ SOLN
0.0000 [IU] | INTRAMUSCULAR | Status: DC
  Administered 2024-07-18 (×2): 5 [IU] via SUBCUTANEOUS
  Filled 2024-07-18 (×2): qty 5

## 2024-07-18 MED ORDER — DEXTROSE IN LACTATED RINGERS 5 % IV SOLN: INTRAVENOUS | Status: DC

## 2024-07-18 MED ORDER — BUPRENORPHINE 10 MCG/HR TD PTWK
2.0000 | MEDICATED_PATCH | TRANSDERMAL | Status: DC
  Administered 2024-07-18: 2 via TRANSDERMAL
  Filled 2024-07-18: qty 2

## 2024-07-18 MED ORDER — INSULIN ASPART 100 UNIT/ML IJ SOLN: 2.0000 [IU] | Freq: Three times a day (TID) | INTRAMUSCULAR | Status: DC

## 2024-07-18 MED ORDER — LACTATED RINGERS IV SOLN: INTRAVENOUS | Status: DC

## 2024-07-18 MED ORDER — LACTATED RINGERS IV BOLUS: 20.0000 mL/kg | Freq: Once | INTRAVENOUS | Status: DC

## 2024-07-18 MED ORDER — DEXTROSE 10 % IV SOLN: INTRAVENOUS | Status: DC

## 2024-07-18 MED ORDER — BUPRENORPHINE 20 MCG/HR TD PTWK: 1.0000 | MEDICATED_PATCH | TRANSDERMAL | Status: DC

## 2024-07-18 MED ORDER — INSULIN REGULAR(HUMAN) IN NACL 100-0.9 UT/100ML-% IV SOLN: INTRAVENOUS | Status: DC

## 2024-07-18 NOTE — Plan of Care (Signed)
°  Problem: Education: Goal: Ability to describe self-care measures that may prevent or decrease complications (Diabetes Survival Skills Education) will improve Outcome: Progressing   Problem: Coping: Goal: Ability to adjust to condition or change in health will improve Outcome: Progressing   Problem: Fluid Volume: Goal: Ability to maintain a balanced intake and output will improve Outcome: Progressing   Problem: Health Behavior/Discharge Planning: Goal: Ability to identify and utilize available resources and services will improve Outcome: Progressing   Problem: Nutritional: Goal: Maintenance of adequate nutrition will improve Outcome: Progressing Goal: Progress toward achieving an optimal weight will improve Outcome: Progressing   Problem: Skin Integrity: Goal: Risk for impaired skin integrity will decrease Outcome: Progressing   Problem: Tissue Perfusion: Goal: Adequacy of tissue perfusion will improve Outcome: Progressing   Problem: Education: Goal: Ability to demonstrate management of disease process will improve Outcome: Progressing Goal: Ability to verbalize understanding of medication therapies will improve Outcome: Progressing   Problem: Activity: Goal: Capacity to carry out activities will improve Outcome: Progressing   Problem: Cardiac: Goal: Ability to achieve and maintain adequate cardiopulmonary perfusion will improve Outcome: Progressing   Problem: Education: Goal: Knowledge of General Education information will improve Description: Including pain rating scale, medication(s)/side effects and non-pharmacologic comfort measures Outcome: Progressing   Problem: Health Behavior/Discharge Planning: Goal: Ability to manage health-related needs will improve Outcome: Progressing   Problem: Clinical Measurements: Goal: Will remain free from infection Outcome: Progressing Goal: Diagnostic test results will improve Outcome: Progressing Goal: Respiratory  complications will improve Outcome: Progressing Goal: Cardiovascular complication will be avoided Outcome: Progressing   Problem: Nutrition: Goal: Adequate nutrition will be maintained Outcome: Progressing   Problem: Coping: Goal: Level of anxiety will decrease Outcome: Progressing   Problem: Pain Managment: Goal: General experience of comfort will improve and/or be controlled Outcome: Progressing   Problem: Safety: Goal: Ability to remain free from injury will improve Outcome: Progressing   Problem: Skin Integrity: Goal: Risk for impaired skin integrity will decrease Outcome: Progressing   Problem: Education: Goal: Understanding of CV disease, CV risk reduction, and recovery process will improve Outcome: Progressing   Problem: Cardiovascular: Goal: Ability to achieve and maintain adequate cardiovascular perfusion will improve Outcome: Progressing   Problem: Metabolic: Goal: Ability to maintain appropriate glucose levels will improve Outcome: Not Progressing   Problem: Clinical Measurements: Goal: Ability to maintain clinical measurements within normal limits will improve Outcome: Not Progressing   Problem: Activity: Goal: Risk for activity intolerance will decrease Outcome: Not Progressing   Problem: Activity: Goal: Ability to return to baseline activity level will improve Outcome: Not Progressing

## 2024-07-18 NOTE — Progress Notes (Addendum)
 eLink Physician-Brief Progress Note Patient Name: Daniel Santos. DOB: Jun 12, 1961 MRN: [id number]   Date of Service  07/18/2024  HPI/Events of Note  K+ 5.4, Mg++ 1.7  eICU Interventions  Mag Sulfate 2 gm iv x 1 ordered, Lokelma ordered.        Reynoldo Mainer U Gabriell Casimir 07/18/2024, 6:32 AM

## 2024-07-18 NOTE — Progress Notes (Addendum)
 Advanced Heart Failure Rounding Note  Cardiologist: None  Chief Complaint: PAH, A/V RV failure, acute respiratory failure Subjective:   RHC 07/17/24: Normal RA pressure and PCWP.  Moderate PAH with elevated PVR. Preserved CO and PAPi.   Lactic acid now cleared. SCr 1.96>2.72 CVP 3/4 K 5.4  PAP: (12-54)/(7-46) 32/29 CVP:  [1 mmHg-20 mmHg] 4 mmHg  Feels fine this morning. Sats in low 80s with O2 off while he eats. Back up to high 90s once O2 back on.   Objective:   Weight Range: 65.1 kg Body mass index is 21.19 kg/m.   Vital Signs:   Temp:  [98 F (36.7 C)-99.3 F (37.4 C)] 98 F (36.7 C) (12/12 0327) Pulse Rate:  [0-98] 74 (12/12 0700) Resp:  [13-40] 15 (12/12 0700) BP: (99-158)/(50-138) 117/61 (12/12 0700) SpO2:  [86 %-100 %] 97 % (12/12 0700) Weight:  [65.1 kg-68 kg] 65.1 kg (12/12 0500) Last BM Date :  (PTA)  Weight change: Filed Weights   07/17/24 1244 07/18/24 0500  Weight: 68 kg 65.1 kg   Intake/Output:   Intake/Output Summary (Last 24 hours) at 07/18/2024 0902 Last data filed at 07/18/2024 0700 Gross per 24 hour  Intake 890.1 ml  Output 900 ml  Net -9.9 ml    Physical Exam  General:  elderly appearing.  No respiratory difficulty. +HFNC Neck: JVD flat.  Cor: Regular rate & rhythm. No murmurs. Lungs: clear, diminished bases Extremities: no edema  Neuro: alert & oriented x 3. Affect pleasant.  Telemetry   NSR 80s (Personally reviewed)    Labs    CBC Recent Labs    07/17/24 0112 07/17/24 0122 07/17/24 1419 07/18/24 0258  WBC 9.9  --   --  7.8  NEUTROABS 8.9*  --   --   --   HGB 14.7   < > 15.0  15.0 12.7*  HCT 47.3   < > 44.0  44.0 39.9  MCV 86.5  --   --  83.8  PLT 202  --   --  133*   < > = values in this interval not displayed.   Basic Metabolic Panel Recent Labs    87/88/74 0915 07/17/24 1419 07/18/24 0258  NA 136 138  138 131*  K 5.6* 5.1  5.0 5.4*  CL 105  --  101  CO2 17*  --  19*  GLUCOSE 213*  --  468*  BUN 43*   --  66*  CREATININE 1.96*  --  2.72*  CALCIUM  8.0*  --  7.6*  MG  --   --  1.7  PHOS  --   --  5.0*   Liver Function Tests Recent Labs    07/17/24 0112 07/18/24 0258  AST 24 16  ALT 18 16  ALKPHOS 65 58  BILITOT 5.4* 5.0*  PROT 5.6* 5.1*  ALBUMIN 2.6* 2.3*   No results for input(s): LIPASE, AMYLASE in the last 72 hours. Cardiac Enzymes No results for input(s): CKTOTAL, CKMB, CKMBINDEX, TROPONINI in the last 72 hours.  BNP: BNP (last 3 results) Recent Labs    07/17/24 0112 07/18/24 0258  BNP 456.0* 378.5*    ProBNP (last 3 results) No results for input(s): PROBNP in the last 8760 hours.   D-Dimer No results for input(s): DDIMER in the last 72 hours. Hemoglobin A1C Recent Labs    07/17/24 1550  HGBA1C 8.1*   Fasting Lipid Panel No results for input(s): CHOL, HDL, LDLCALC, TRIG, CHOLHDL, LDLDIRECT in the last 72  hours. Thyroid Function Tests No results for input(s): TSH, T4TOTAL, T3FREE, THYROIDAB in the last 72 hours.  Invalid input(s): FREET3  Other results:  Imaging  DG CHEST PORT 1 VIEW Result Date: 07/17/2024 EXAM: 1 VIEW(S) XRAY OF THE CHEST 07/17/2024 05:24:00 PM COMPARISON: 07/17/2024 CLINICAL HISTORY: Hypoxia. FINDINGS: LINES, TUBES AND DEVICES: New right IJ Swan-Ganz catheter with its tip directed peripherally into right lower lobe pulmonary artery. Consider withdrawal approximately 7 cm. LUNGS AND PLEURA: Small left pleural effusion. Improved mild pulmonary edema. Persistent interstitial changes. No pneumothorax. HEART AND MEDIASTINUM: No acute abnormality of the cardiac and mediastinal silhouettes. BONES AND SOFT TISSUES: No acute osseous abnormality. IMPRESSION: 1. Right IJ Swan-Ganz catheter with the tip directed peripherally into the right lower lobe pulmonary artery; recommend withdrawing approximately 7 cm. 2. Small left pleural effusion. 3. Mild pulmonary edema has improved. Electronically signed by: Pinkie Pebbles MD 07/17/2024 05:35 PM EST RP Workstation: [id number]   CARDIAC CATHETERIZATION Result Date: 07/17/2024 1. Normal RA pressure and PCWP. 2. Moderate pulmonary arterial hypertension with elevated PVR. 3. Preserved cardiac output and PAPi.    Medications:    Scheduled Medications:  apixaban   5 mg Oral BID   arformoterol  15 mcg Nebulization BID   atorvastatin   40 mg Oral Daily   azaTHIOprine  100 mg Oral Daily   Chlorhexidine  Gluconate Cloth  6 each Topical Daily   cycloSPORINE modified  100 mg Oral BID   insulin  aspart  0-6 Units Subcutaneous Q4H   insulin  aspart  2 Units Subcutaneous TID WC   insulin  glargine  6 Units Subcutaneous Daily   insulin  pump   Subcutaneous TID WC, HS, 0200   methylPREDNISolone (SOLU-MEDROL) injection  40 mg Intravenous Daily   polyethylene glycol  17 g Oral Daily   revefenacin  175 mcg Nebulization Daily   sodium chloride  flush  3 mL Intravenous Q12H   sodium chloride  flush  3 mL Intravenous Q12H   sodium zirconium cyclosilicate  10 g Per Tube Daily   tamsulosin  0.4 mg Oral BID    Infusions:  sodium chloride  10 mL/hr at 07/18/24 0700   cefTRIAXone (ROCEPHIN)  IV 2 g (07/18/24 0850)   doxycycline (VIBRAMYCIN) IV Stopped (07/18/24 0237)   magnesium sulfate bolus IVPB 2 g (07/18/24 0841)    PRN Medications: sodium chloride , acetaminophen  **OR** acetaminophen , docusate sodium , prochlorperazine, sodium chloride  flush  Patient Profile   Daniel Santos. is a 64 y.o. 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 Admitted with acute hypoxic resp failure and pulmonary HTN.   Assessment/Plan  Pulmonary Hypertension with Cor Pulmonale and Acute Hypoxic Respiratory Failure:  - Progressive and limiting shortness of breath and fatigue over the past year. Now requiring NRB, desaturations  with conversation. CTA negative for PE, has no history of prior Pes. He does have emphysema, long history of smoking. - Echo 4/21: mild pHTN with mild RV dysfunction, RVSP 49 - RHC 5/21: RA 7, PA 34/8, PCW 13, co/ci 4.67/2.69 - Echo 10/25: EF 55-60% severely elevated PA pressures, mod TR, RVSP 61 - RHC 07/17/24: Normal RA pressure and PCWP.  Moderate PAH with elevated PVR. Preserved CO and PAPi.  - NYHA IV on admit. Euvolemic on exam. CVP 4. Hold further diuretics. Will give 250LR bolus with single kidney.    CAD - coronary CTA 9/25: calcium  score 315  with mild nonobstructive CAD - continue atorva 40 mg daily - on eliquis    AKI on CKD 3b - h/o kidney tx - Cr 1.8>1.6>1.96>2.72 - 250 bolus as above - avoid hypotension   DVT - US  9/25: acute deep DVT in common femoral and superficial DVT in the great saphenous - on eliquis    PFO  - noted on CTA  Hyperkalemia - K 5.4 - lokelma 10 g x1 today - Repeat BMET this afternoon.    H/o CVA H/o Dual Organ Transplant: pancreas and kidney in 2021  Length of Stay: 1  Beckey LITTIE Coe, NP  07/18/2024, 9:02 AM  Advanced Heart Failure Team Pager [phone] (M-F; 7a - 5p)  Please contact [facility] for night-coverage after hours (5p -7a ) and weekends on [email or web address]   Patient seen with NP, I formulated the plan and agree with the above note.   CVP 3-4 today, PA systolic pressure 30s-40s.  He received IV Lasix yesterday, creatinine up to 2.72 today.   He is currently on ceftriaxone/doxycline + Solumedrol for treatment of AECOPD.   Glucose >400 today with elevated beta-hydroxybutyrate suggesting DKA.  His home insulin  pump is off.   General: NAD Neck: No JVD, no thyromegaly or thyroid nodule.  Lungs: Clear to auscultation bilaterally with normal respiratory effort.  No wheezing.  CV: Nondisplaced PMI.  Heart regular S1/S2, no S3/S4, no murmur.  No peripheral edema.   Abdomen: Soft, nontender, no hepatosplenomegaly, no distention.  Skin:  Intact without lesions or rashes.  Neurologic: Alert and oriented x 3.  Psych: Normal affect. Extremities: No clubbing or cyanosis.  HEENT: Normal.   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  He is not volume overloaded and has developed AKI.  - No further diuretics needed.  - Can remove Swan today.  Can keep introducer to follow CVP.  - We are giving 250 cc LR bolus, he will get additional IVF with treatment of DKA.   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  Management with steroids/abx per CCM.   DKA in type 1 diabetic in setting of steroid use and also has been off home insulin  gtt.  Management per CCM.   Cardiology will sign off at this point.  Call with questions.   Ezra Shuck 07/18/2024 10:56 AM

## 2024-07-18 NOTE — Consult Note (Addendum)
 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 He has chronic RLE edema and was found to have extensive DVT in that leg in 04/2024 and was started on apixaban . He seldom misses doses of DOAC. He did not have a PE at that time, but he was found to have PH and R heart failure with dilated RV with TR in 05/2024. In the ED he was hypoxic and requiring NRB for hypoxia. He evaluated with CTA that did not demonstrate a PE. Echo showed progressive RV failure. Heart failure evaluated him and recommended transfer to the cardiac ICU.   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 Elevated BHB. No specific complaints this morning. Says breathing feels much better.  Objective    Blood pressure (!) 110/59, pulse 75, temperature 98 F (36.7 C), temperature source Oral, resp. rate 19, height 5' 9 (1.753 m), weight 65.1 kg, SpO2 97%. PAP: (12-54)/(7-46) 45/42 CVP:  [1 mmHg-21 mmHg] 5 mmHg      Intake/Output Summary (Last 24 hours) at 07/18/2024 1451 Last data filed at 07/18/2024 1300 Gross per 24 hour  Intake 1599.98 ml  Output 1200 ml  Net 399.98 ml    Filed Weights   07/17/24 1244 07/18/24 0500  Weight: 68 kg 65.1 kg    Examination: General: chronically ill appearing male, lying in bed, NAD HENT: [place]/AT, sclera anicteric Lungs: breathing comfortably on high flow nasal cannula, diminished bilateral bases Cardiovascular: regular rate and rhythm, normal S1 and S2, no m/r/g  Abdomen: soft, non-tender, non-distended Extremities: mild LE edema Neuro: awake, alert, answers questions appropriately, moves all extremities   Resolved problem list   Assessment and Plan   Acute respiratory failure with hypoxia Emphysema Bilateral pleural effusions -bronchonchodilators- brovana & yupelri added -solumedrol 40mg   -doxy & ceftriaxone -follow-up legionella, pneumococcus -check sputum culture -lasix 60mg  -supplemental O2 to maintain SpO2 >90%  Acute on chronic RV failure, severe PH; unclear etiology, but new in 05/2024 compared to previous echo in 2018. RHC 12/11 with normal RA and PCWP, moderate PAH and elevated PVR, preserved CO and PAPi. CTA negative for PE. -advanced heart failure following, appreciate recommendations  -holding diuresis today with worsening AKI and DKA  H/o CAD -aspirin , statin -hold PTA coreg  for now  AKI, worsening in the setting of DKA Hyperkalemia -holding diuresis  History of renal and kidney transplant -con't PTA prednisone, imuran, cyclosporine  T1DM with DKA, in the setting of pump malfunction and steroids -off home insulin  pump  -will hold diuresis but avoid giving fluid in the setting of volume overload -fixed dose insulin  drip and D5 drip through tomorrow and will transition to glargine and SSI -monitor BHB and BMP  q4h   H/o DVT -con't PTA apixaban   H/o BPH -con't PTA tamsulosin  Medical decision maker-- [name], Daniel Santos  Full code  Labs   CBC: Recent Labs  Lab 07/17/24 0112 07/17/24 0122 07/17/24 1419 07/18/24 0258  WBC 9.9  --   --  7.8  NEUTROABS 8.9*  --   --   --   HGB  14.7 16.3  16.3 15.0  15.0 12.7*  HCT 47.3 48.0  48.0 44.0  44.0 39.9  MCV 86.5  --   --  83.8  PLT 202  --   --  133*    Basic Metabolic Panel: Recent Labs  Lab 07/17/24 0112 07/17/24 0122 07/17/24 0915 07/17/24 1419 07/18/24 0258 07/18/24 1206  NA 137 136  137 136 138  138 131* 132*  K 5.8* 6.1*  6.1* 5.6* 5.1  5.0 5.4* 4.2  CL 105 108 105  --  101 100  CO2 20*  --  17*  --  19* 20*  GLUCOSE 213* 210* 213*  --  468* 308*  BUN 36* 51* 43*  --  66* 68*  CREATININE 1.80* 1.60* 1.96*  --  2.72* 2.49*  CALCIUM  8.4*  --  8.0*  --  7.6* 7.6*  MG  --   --   --   --  1.7  --   PHOS  --   --   --   --  5.0*  --    GFR: Estimated Creatinine Clearance: 27.6 mL/min (A) (by C-G formula based on SCr of 2.49 mg/dL (H)). Recent Labs  Lab 07/17/24 0112 07/17/24 0123 07/17/24 0558 07/17/24 1701 07/17/24 2053 07/18/24 0258  WBC 9.9  --   --   --   --  7.8  LATICACIDVEN  --    < > 3.0* 1.8 2.1* 1.0   < > = values in this interval not displayed.    Liver Function Tests: Recent Labs  Lab 07/17/24 0112 07/18/24 0258  AST 24 16  ALT 18 16  ALKPHOS 65 58  BILITOT 5.4* 5.0*  PROT 5.6* 5.1*  ALBUMIN 2.6* 2.3*   No results for input(s): LIPASE, AMYLASE in the last 168 hours. No results for input(s): AMMONIA in the last 168 hours.  ABG    Component Value Date/Time   HCO3 17.0 (L) 07/17/2024 1419   HCO3 16.8 (L) 07/17/2024 1419   TCO2 18 (L) 07/17/2024 1419   TCO2 18 (L) 07/17/2024 1419   ACIDBASEDEF 6.0 (H) 07/17/2024 1419   ACIDBASEDEF 7.0 (H) 07/17/2024 1419   O2SAT 73 07/17/2024 1419   O2SAT 70 07/17/2024 1419     Coagulation Profile: No results for input(s): INR, PROTIME in the last 168 hours.  Cardiac Enzymes: No results for input(s): CKTOTAL, CKMB, CKMBINDEX, TROPONINI in the last 168 hours.  HbA1C: Hgb A1c MFr Bld  Date/Time Value Ref Range Status  07/17/2024 03:50 PM 8.1 (H) 4.8 - 5.6 % Final    Comment:    (NOTE) Diagnosis of  Diabetes The following HbA1c ranges recommended by the American Diabetes Association (ADA) may be used as an aid in the diagnosis of diabetes mellitus.  Hemoglobin             Suggested A1C NGSP%              Diagnosis  <5.7                   Non Diabetic  5.7-6.4  Pre-Diabetic  >6.4                   Diabetic  <7.0                   Glycemic control for                       adults with diabetes.    10/26/2017 09:29 AM 7.7 (H) 4.8 - 5.6 % Final    Comment:    (NOTE) Pre diabetes:          5.7%-6.4% Diabetes:              >6.4% Glycemic control for   <7.0% adults with diabetes     CBG: Recent Labs  Lab 07/18/24 0306 07/18/24 0448 07/18/24 0905 07/18/24 1106 07/18/24 1319  GLUCAP 458* 378* 357* 347* 285*    Critical care time:     The patient is critically ill with multiple organ system failure and requires high complexity decision making for assessment and support, frequent evaluation and titration of therapies, advanced monitoring, review of radiographic studies and interpretation of complex data.   Critical Care Time devoted to patient care services, exclusive of separately billable procedures, described in this note is 30 minutes.  Rexene LOISE Blush, PA-C [facility] 07/18/2024 2:52 PM  Please see [email or web address] for pager details.  From 7A-7P if no response, please call [phone] After hours, please call ELink [phone]

## 2024-07-18 NOTE — Progress Notes (Signed)
 eLink Physician-Brief Progress Note Patient Name: Daniel Santos. DOB: [birth year] MRN: [id number]   Date of Service  07/18/2024  HPI/Events of Note  Patient with uncontrolled Type 1 diabetes with hyperglycemia secondary to suspected Insulin  pump dysfunction.  eICU Interventions  Insulin  pump held and regimen recommended by diabetic coordinator for pump malfunction ordered.         Chimaobi Casebolt U Hamda Klutts 07/18/2024, 3:57 AM

## 2024-07-18 NOTE — Inpatient Diabetes Management (Addendum)
 Inpatient Diabetes Program Recommendations  AACE/ADA: New Consensus Statement on Inpatient Glycemic Control (2015)  Target Ranges:  Prepandial:   less than 140 mg/dL      Peak postprandial:   less than 180 mg/dL (1-2 hours)      Critically ill patients:  140 - 180 mg/dL   Lab Results  Component Value Date   GLUCAP 357 (H) 07/18/2024   HGBA1C 8.1 (H) 07/17/2024    Review of Glycemic Control  Latest Reference Range & Units 07/17/24 16:56 07/17/24 21:04 07/18/24 03:06 07/18/24 04:48 07/18/24 09:05  Glucose-Capillary 70 - 99 mg/dL 764 (H) 709 (H) 541 (H) 378 (H) 357 (H)   Diabetes history: DM 1 Outpatient Diabetes medications:  Medtronic insulin  pump:  Per Dr. Tommas, insulin  pump settings are:  MN-0.3 units/hr 6a- 0.375 units/ hr 9p- 0.35 units/hr CHO ratio: 1 unit/9 grams of CHO Sensitivity:  MN- 90 mg/dL 5p- 80 mg/dL Current orders for Inpatient glycemic control:  Lantus 6 units x1 (Given at 0456) Novolog 2 units tid with meals Novolog 0-6 units q 4 hours Solumedrol 40 mg daily  Inpatient Diabetes Program Recommendations:    Met with patient at bedside.  CBG's increased substantially overnight. Patient did receive Solumedrol 40 mg last night.  Also per patient his sensor is off and therefore insulin  pump was in manual mode overnight.  In manual mode, he only receives approximately 8 units of basal insulin  daily. However in the history of the pump, it appears that his 7 day average of basal insulin  is approximately 14.925 units/24 hours.   Pump is almost out of insulin  and patient does not have any supplies available. Assisted patient in removing insulin  pump and site. (RN to place in secure place so it does not get lost).   He states that his significant other was not able to find his guardian sensors.  Patient is sensitive to insulin  doses and 1 unit of insulin  per his pump settings drops him about 80-90 mg/dL.   -Therefore recommend adding a one time dose of Lantus 8 units  daily and increase Lantus to 14 units daily (starting tomorrow).  Hopefully blood sugars will improve and be more stable with basal/bolus.    **If patient is to restart insulin  pump during hospitalization, it will need to be 20-24 hours after basal insulin  to prevent hypoglycemia.  Patient uses Guardian 4 sensor [phone])- through DME coverage (probably cannot be picked up at retail pharmacy).   Thanks,  Randall Bullocks, RN, BC-ADM Inpatient Diabetes Coordinator Pager [phone]  (8a-5p)

## 2024-07-18 NOTE — Progress Notes (Signed)
 Insulin  Pump given to significant other to take home. Pt reports when ready to restart, he will call girlfriend to bring back.

## 2024-07-19 DIAGNOSIS — E1029 Type 1 diabetes mellitus with other diabetic kidney complication: Secondary | ICD-10-CM

## 2024-07-19 DIAGNOSIS — R809 Proteinuria, unspecified: Secondary | ICD-10-CM | POA: Diagnosis not present

## 2024-07-19 DIAGNOSIS — J441 Chronic obstructive pulmonary disease with (acute) exacerbation: Secondary | ICD-10-CM

## 2024-07-19 DIAGNOSIS — E7800 Pure hypercholesterolemia, unspecified: Secondary | ICD-10-CM | POA: Diagnosis present

## 2024-07-19 DIAGNOSIS — N1831 Chronic kidney disease, stage 3a: Secondary | ICD-10-CM | POA: Diagnosis present

## 2024-07-19 DIAGNOSIS — Z9483 Pancreas transplant status: Secondary | ICD-10-CM | POA: Diagnosis not present

## 2024-07-19 DIAGNOSIS — Z86718 Personal history of other venous thrombosis and embolism: Secondary | ICD-10-CM | POA: Diagnosis not present

## 2024-07-19 DIAGNOSIS — I159 Secondary hypertension, unspecified: Secondary | ICD-10-CM

## 2024-07-19 DIAGNOSIS — Z940 Kidney transplant status: Secondary | ICD-10-CM | POA: Diagnosis not present

## 2024-07-19 DIAGNOSIS — Z8673 Personal history of transient ischemic attack (TIA), and cerebral infarction without residual deficits: Secondary | ICD-10-CM | POA: Diagnosis not present

## 2024-07-19 LAB — BASIC METABOLIC PANEL WITH GFR
Anion gap: 10 (ref 5–15)
Anion gap: 7 (ref 5–15)
Anion gap: 13 (ref 5–15)
BUN: 70 mg/dL — ABNORMAL HIGH (ref 8–23)
BUN: 71 mg/dL — ABNORMAL HIGH (ref 8–23)
BUN: 76 mg/dL — ABNORMAL HIGH (ref 8–23)
CO2: 23 mmol/L (ref 22–32)
CO2: 22 mmol/L (ref 22–32)
CO2: 19 mmol/L — ABNORMAL LOW (ref 22–32)
Calcium: 8 mg/dL — ABNORMAL LOW (ref 8.9–10.3)
Calcium: 7.9 mg/dL — ABNORMAL LOW (ref 8.9–10.3)
Calcium: 7.6 mg/dL — ABNORMAL LOW (ref 8.9–10.3)
Chloride: 101 mmol/L (ref 98–111)
Chloride: 104 mmol/L (ref 98–111)
Chloride: 100 mmol/L (ref 98–111)
Creatinine, Ser: 2.41 mg/dL — ABNORMAL HIGH (ref 0.61–1.24)
Creatinine, Ser: 2.43 mg/dL — ABNORMAL HIGH (ref 0.61–1.24)
Creatinine, Ser: 2.45 mg/dL — ABNORMAL HIGH (ref 0.61–1.24)
GFR, Estimated: 29 mL/min — ABNORMAL LOW (ref >60)
GFR, Estimated: 29 mL/min — ABNORMAL LOW (ref >60)
GFR, Estimated: 29 mL/min — ABNORMAL LOW (ref >60)
Glucose, Bld: 61 mg/dL — ABNORMAL LOW (ref 70–99)
Glucose, Bld: 228 mg/dL — ABNORMAL HIGH (ref 70–99)
Glucose, Bld: 450 mg/dL — ABNORMAL HIGH (ref 70–99)
Potassium: 3.9 mmol/L (ref 3.5–5.1)
Potassium: 4.3 mmol/L (ref 3.5–5.1)
Potassium: 4.8 mmol/L (ref 3.5–5.1)
Sodium: 134 mmol/L — ABNORMAL LOW (ref 135–145)
Sodium: 133 mmol/L — ABNORMAL LOW (ref 135–145)
Sodium: 132 mmol/L — ABNORMAL LOW (ref 135–145)

## 2024-07-19 LAB — BETA-HYDROXYBUTYRIC ACID
Beta-Hydroxybutyric Acid: 0.11 mmol/L (ref 0.05–0.27)
Beta-Hydroxybutyric Acid: 0.41 mmol/L — ABNORMAL HIGH (ref 0.05–0.27)

## 2024-07-19 LAB — CBC
HCT: 39.1 % (ref 39.0–52.0)
Hemoglobin: 12.5 g/dL — ABNORMAL LOW (ref 13.0–17.0)
MCH: 26.7 pg (ref 26.0–34.0)
MCHC: 32 g/dL (ref 30.0–36.0)
MCV: 83.5 fL (ref 80.0–100.0)
Platelets: 184 K/uL (ref 150–400)
RBC: 4.68 MIL/uL (ref 4.22–5.81)
RDW: 16.3 % — ABNORMAL HIGH (ref 11.5–15.5)
WBC: 11.3 K/uL — ABNORMAL HIGH (ref 4.0–10.5)
nRBC: 0.5 % — ABNORMAL HIGH (ref 0.0–0.2)

## 2024-07-19 LAB — CYCLOSPORINE: Cyclosporine, LabCorp: 174 ng/mL (ref 100–400)

## 2024-07-19 LAB — COMPREHENSIVE METABOLIC PANEL WITH GFR
ALT: 13 U/L (ref 0–44)
AST: 27 U/L (ref 15–41)
Albumin: 2.3 g/dL — ABNORMAL LOW (ref 3.5–5.0)
Alkaline Phosphatase: 55 U/L (ref 38–126)
Anion gap: 9 (ref 5–15)
BUN: 68 mg/dL — ABNORMAL HIGH (ref 8–23)
CO2: 22 mmol/L (ref 22–32)
Calcium: 7.5 mg/dL — ABNORMAL LOW (ref 8.9–10.3)
Chloride: 103 mmol/L (ref 98–111)
Creatinine, Ser: 2.42 mg/dL — ABNORMAL HIGH (ref 0.61–1.24)
GFR, Estimated: 29 mL/min — ABNORMAL LOW (ref >60)
Glucose, Bld: 225 mg/dL — ABNORMAL HIGH (ref 70–99)
Potassium: 4.7 mmol/L (ref 3.5–5.1)
Sodium: 134 mmol/L — ABNORMAL LOW (ref 135–145)
Total Bilirubin: 3.1 mg/dL — ABNORMAL HIGH (ref 0.0–1.2)
Total Protein: 5.1 g/dL — ABNORMAL LOW (ref 6.5–8.1)

## 2024-07-19 LAB — GLUCOSE, CAPILLARY
Glucose-Capillary: 211 mg/dL — ABNORMAL HIGH (ref 70–99)
Glucose-Capillary: 281 mg/dL — ABNORMAL HIGH (ref 70–99)
Glucose-Capillary: 413 mg/dL — ABNORMAL HIGH (ref 70–99)
Glucose-Capillary: 382 mg/dL — ABNORMAL HIGH (ref 70–99)
Glucose-Capillary: 193 mg/dL — ABNORMAL HIGH (ref 70–99)

## 2024-07-19 MED ORDER — BUDESONIDE 0.25 MG/2ML IN SUSP
0.2500 mg | Freq: Two times a day (BID) | RESPIRATORY_TRACT | Status: DC
  Administered 2024-07-19 – 2024-07-22 (×6): 0.25 mg via RESPIRATORY_TRACT
  Filled 2024-07-19 (×6): qty 2

## 2024-07-19 MED ORDER — INSULIN GLARGINE 100 UNIT/ML ~~LOC~~ SOLN
14.0000 [IU] | Freq: Every day | SUBCUTANEOUS | Status: DC
  Administered 2024-07-19: 14 [IU] via SUBCUTANEOUS
  Filled 2024-07-19 (×2): qty 0.14

## 2024-07-19 MED ORDER — INSULIN GLARGINE-YFGN 100 UNIT/ML ~~LOC~~ SOPN: 14.0000 [IU] | PEN_INJECTOR | Freq: Every day | SUBCUTANEOUS | Status: DC

## 2024-07-19 MED ORDER — INSULIN ASPART 100 UNIT/ML IJ SOLN
20.0000 [IU] | Freq: Once | INTRAMUSCULAR | Status: AC
  Administered 2024-07-19: 20 [IU] via SUBCUTANEOUS
  Filled 2024-07-19: qty 20

## 2024-07-19 MED ORDER — INSULIN GLARGINE 100 UNIT/ML ~~LOC~~ SOLN
14.0000 [IU] | Freq: Every day | SUBCUTANEOUS | Status: DC
  Filled 2024-07-19: qty 0.14

## 2024-07-19 MED ORDER — INSULIN ASPART 100 UNIT/ML IJ SOLN
0.0000 [IU] | Freq: Three times a day (TID) | INTRAMUSCULAR | Status: DC
  Administered 2024-07-19: 15 [IU] via SUBCUTANEOUS
  Administered 2024-07-20: 3 [IU] via SUBCUTANEOUS
  Administered 2024-07-20: 8 [IU] via SUBCUTANEOUS
  Filled 2024-07-19: qty 3
  Filled 2024-07-19: qty 1
  Filled 2024-07-19: qty 15

## 2024-07-19 MED ORDER — CARVEDILOL 12.5 MG PO TABS
12.5000 mg | ORAL_TABLET | Freq: Two times a day (BID) | ORAL | Status: DC
  Administered 2024-07-19 – 2024-07-22 (×6): 12.5 mg via ORAL
  Filled 2024-07-19 (×6): qty 1

## 2024-07-19 MED ORDER — PREDNISONE 10 MG PO TABS
10.0000 mg | ORAL_TABLET | Freq: Every day | ORAL | Status: DC
  Administered 2024-07-20 – 2024-07-22 (×3): 10 mg via ORAL
  Filled 2024-07-19 (×3): qty 1

## 2024-07-19 MED ORDER — AZATHIOPRINE 50 MG PO TABS
100.0000 mg | ORAL_TABLET | Freq: Every day | ORAL | Status: DC
  Administered 2024-07-20 – 2024-07-22 (×3): 100 mg via ORAL
  Filled 2024-07-19 (×3): qty 2

## 2024-07-19 NOTE — Assessment & Plan Note (Addendum)
 Echocardiogram with preserved LV systolic function with EF 55 to 60%, indeterminate diastolic parameters, RV systolic function preserved, RVSP 60,8 mmHg, mild mitral valve regurgitation.  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  Holding diuretic therapy for now, possible use of loop diuretic as needed as outpatient.  Resumed carvedilol  with good toleration   Acute hypoxemic respiratory failure, today 02 saturation is 90% on 4 L/min per Cl[place], he may need home 02 at the time of discharge.  Will do home 02 screen tomorrow prior to discharge.

## 2024-07-19 NOTE — Assessment & Plan Note (Signed)
 Uncontrolled hyperglycemia  He was diagnosed with DKA in the ICU (now resolved)  Plan to continue basal insulin , increase dose to 14 units.  Increase dose insulin  sliding scale Continue close capillary monitoring Plan to resume insulin  pump prior to discharge.

## 2024-07-19 NOTE — Assessment & Plan Note (Signed)
 Continue with carvedilol  for blood pressure control.

## 2024-07-19 NOTE — Inpatient Diabetes Management (Signed)
 Inpatient Diabetes Program Recommendations  AACE/ADA: New Consensus Statement on Inpatient Glycemic Control (2015)  Target Ranges:  Prepandial:   less than 140 mg/dL      Peak postprandial:   less than 180 mg/dL (1-2 hours)      Critically ill patients:  140 - 180 mg/dL   Lab Results  Component Value Date   GLUCAP 281 (H) 07/19/2024   HGBA1C 8.1 (H) 07/17/2024    Inpatient Diabetes Program Recommendations:   Please consider: -Increase Lantus  to 14 units nightly  **If patient is to restart insulin  pump during hospitalization, it will need to be 20-24 hours after basal insulin  to prevent hypoglycemia.  Patient uses Guardian 4 sensor [phone])- through DME coverage (probably cannot be picked up at retail pharmacy).   Thank you, Bayleigh Loflin E. Addisson Frate, RN, MSN, CNS, CDCES  Diabetes Coordinator Inpatient Glycemic Control Team Team Pager [phone] (8am-5pm) 07/19/2024 9:06 AM

## 2024-07-19 NOTE — Progress Notes (Addendum)
 Progress Note   Patient: Daniel Santos. FMW:[id number] DOB: 27-Aug-1960 DOA: 07/17/2024     2 DOS: the patient was seen and examined on 07/19/2024   Brief hospital course: Mr. Dy was admitted to the hospital with the working diagnosis of acute heart failure decompensation.   63 yo male with the past medical history of T1DM, sp pancreatic and renal transplantation, coronary artery disease, pulmonary hypertension, history of CVA and DVT, who presented with dyspnea. Patient reported worsening dyspnea on exertion along with lower extremity edema for about 2 days prior to admission, to the point where he was dyspneic with minimal efforts.  Because severe symptoms EMS was called, he was found hypoxemic 70% on room air and with increased work of breathing, he was placed on non re breather mask supplemental 02 and was transported to the ED.  On his initial physical examination his blood pressure was 115/63, HR 93, RR 33 and 02 saturation 97% on supplemental 02  Lungs with no wheezing, but positive rales and increased work of breathing, heart with S1 and S2 present and regular with no gallops or rubs, abdomen with no distention, and bilateral ankle edema.   VBG 7.31/ 42/ 41/ 20/ 71%  Na 137, K 5.8 Cl 105 bicarbonate 20, glucose 213 bun 36 cr 1,8  Anion gap 12  AST 24 ALT 18  BNP 456  High sensitive troponin 50, 118 , 137   Lactic acid 2,9  Wbc 9.9 hgb 14.7 plt 202   Chest radiograph with hyperinflation, no cardiomegaly, bilateral hilar vascular congestion, cephalization of the vasculature, bilateral central interstitial infiltrates.  Right lower lobe atelectasis   CT chest with no evidence of pulmonary embolus Small to moderate bilateral pleural effusions.  Bilateral ground glass opacities with interlobular septal thickening diffuse and bilateral   EKG 95 bpm, normal axis, normal intervals, qtc 477, sinus rhythm with bilateral atrial enlargement, positive LVH with poor RR wave progression,  no significant ST segment or T wave changes.   Patient was placed on furosemide .  Transfer to cardiac ICU for possible cardiogenic shock.  Cardiac catheterization with pulmonary hypertension, but preserved cardiac output and index. Normal filling pressures.   12/13 transferred back to [facility].   Assessment and Plan: * Acute on chronic heart failure with preserved ejection fraction (HFpEF) (HCC) Echocardiogram with preserved LV systolic function with EF 55 to 60%, indeterminate diastolic parameters, RV systolic function preserved, RVSP 60,8 mmHg, mild mitral valve regurgitation.  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  Holding diuretic therapy for now, possible use of loop diuretic as needed as outpatient.   COPD exacerbation (HCC) Improved gas exchange.   Plan to continue revefenacin  and arfomoterol Add inhaled corticosteroid with budesonide  Discontinue IV methylprednisolone     Hypertension Continue blood pressure monitoring,   Chronic kidney disease, stage 3a (HCC) AKI, hyperkalemia, hyponatremia   Renal function today with serum cr at 2.45 with K at 4.8 and serum bicarbonate at 19 Na 132   Continue to hold on diuretic therapy Follow up renal function and electrolytes in am.   Type 1 diabetes mellitus (HCC) Uncontrolled hyperglycemia  He was diagnosed with DKA in the ICU (now resolved)  Plan to continue basal insulin , increase dose to 14  units.  Increase dose insulin  sliding scale Continue close capillary monitoring Plan to resume insulin  pump prior to discharge.   Hypercholesteremia Continue with statin   History of simultaneous kidney and pancreas transplant (HCC) Immunosuppressive therapy with  prednisone , cyclosporine  and azathioprine .   History of DVT (deep vein thrombosis) Continue anticoagulation with apixaban   History of stroke Continue blood pressure control and statin therapy.        Subjective: Patient is feeling better, dyspnea has improved, no chest pain, he has been tolerating po well. His glucose has been elevated.   Physical Exam: Vitals:   07/19/24 0528 07/19/24 0530 07/19/24 0819 07/19/24 1210  BP:  (!) 147/71 128/64   Pulse:  86 76   Resp:  18 12   Temp:  97.8 F (36.6 C) 98 F (36.7 C) 98.3 F (36.8 C)  TempSrc:  Oral Oral Oral  SpO2:  99% 100%   Weight: 66.6 kg     Height:       Neurology awake and alert ENT with mild pallor with no icterus Cardiovascular with S1 and S2 present and regular with no gallops, rubs or murmurs No JVD Respiratory with no rales or rhonchi, no wheezing Abdomen with no distention  Lower extremity with no edema   Data Reviewed:    Family Communication: no family at the bedside   Disposition: Status is: Inpatient Remains inpatient appropriate because: recovering respiratory failure   Planned Discharge Destination: Home     Author: Elidia Toribio Furnace, MD 07/19/2024 3:53 PM  For on call review [email or web address].

## 2024-07-19 NOTE — Hospital Course (Addendum)
 Daniel Santos was admitted to the hospital with the working diagnosis of acute heart failure decompensation.   63 yo male with the past medical history of T1DM, sp pancreatic and renal transplantation, coronary artery disease, pulmonary hypertension, history of CVA and DVT, who presented with dyspnea. Patient reported worsening dyspnea on exertion along with lower extremity edema for about 2 days prior to admission, to the point where he was dyspneic with minimal efforts.  Because severe symptoms EMS was called, he was found hypoxemic 70% on room air and with increased work of breathing, he was placed on non re breather mask supplemental 02 and was transported to the ED.  On his initial physical examination his blood pressure was 115/63, HR 93, RR 33 and 02 saturation 97% on supplemental 02  Lungs with no wheezing, but positive rales and increased work of breathing, heart with S1 and S2 present and regular with no gallops or rubs, abdomen with no distention, and bilateral ankle edema.   VBG 7.31/ 42/ 41/ 20/ 71%  Na 137, K 5.8 Cl 105 bicarbonate 20, glucose 213 bun 36 cr 1,8  Anion gap 12  AST 24 ALT 18  BNP 456  High sensitive troponin 50, 118 , 137   Lactic acid 2,9  Wbc 9.9 hgb 14.7 plt 202   Chest radiograph with hyperinflation, no cardiomegaly, bilateral hilar vascular congestion, cephalization of the vasculature, bilateral central interstitial infiltrates.  Right lower lobe atelectasis   CT chest with no evidence of pulmonary embolus Small to moderate bilateral pleural effusions.  Bilateral ground glass opacities with interlobular septal thickening diffuse and bilateral   EKG 95 bpm, normal axis, normal intervals, qtc 477, sinus rhythm with bilateral atrial enlargement, positive LVH with poor RR wave progression, no significant ST segment or T wave changes.   Patient was placed on furosemide .  Transfer to cardiac ICU for possible cardiogenic shock.  Cardiac catheterization with  pulmonary hypertension, but preserved cardiac output and index. Normal filling pressures.   12/13 transferred back to [facility]. Discontinue central line  12/14 resume insulin  pump, possible discharge tomorrow.  12/15 continue to have increase 02 requirements.  12/16 patient clinically stable for discharge.

## 2024-07-19 NOTE — Assessment & Plan Note (Signed)
 AKI, hyperkalemia, hyponatremia   Today renal function with serum cr at 2.31 with K at 5.1 and serum bicarbonate at 22  Na 135   Resumed diuretic therapy with furosemide  40 mg po daily.  Follow up renal function and electrolytes in am.

## 2024-07-19 NOTE — Assessment & Plan Note (Signed)
Continue blood pressure control and statin therapy.  Follow up with vascular surgery as outpatient. Currently patient with no claudication. Smoking cessation counseling.

## 2024-07-19 NOTE — Assessment & Plan Note (Signed)
-   Continue with statin

## 2024-07-19 NOTE — Plan of Care (Signed)
 Problem: Education: Goal: Ability to describe self-care measures that may prevent or decrease complications (Diabetes Survival Skills Education) will improve Outcome: Progressing Goal: Individualized Educational Video(s) Outcome: Progressing   Problem: Coping: Goal: Ability to adjust to condition or change in health will improve Outcome: Progressing   Problem: Fluid Volume: Goal: Ability to maintain a balanced intake and output will improve Outcome: Progressing   Problem: Health Behavior/Discharge Planning: Goal: Ability to identify and utilize available resources and services will improve Outcome: Progressing Goal: Ability to manage health-related needs will improve Outcome: Progressing   Problem: Metabolic: Goal: Ability to maintain appropriate glucose levels will improve Outcome: Progressing   Problem: Nutritional: Goal: Maintenance of adequate nutrition will improve Outcome: Progressing Goal: Progress toward achieving an optimal weight will improve Outcome: Progressing   Problem: Skin Integrity: Goal: Risk for impaired skin integrity will decrease Outcome: Progressing   Problem: Tissue Perfusion: Goal: Adequacy of tissue perfusion will improve Outcome: Progressing   Problem: Education: Goal: Ability to demonstrate management of disease process will improve Outcome: Progressing Goal: Ability to verbalize understanding of medication therapies will improve Outcome: Progressing Goal: Individualized Educational Video(s) Outcome: Progressing   Problem: Activity: Goal: Capacity to carry out activities will improve Outcome: Progressing   Problem: Cardiac: Goal: Ability to achieve and maintain adequate cardiopulmonary perfusion will improve Outcome: Progressing   Problem: Education: Goal: Knowledge of General Education information will improve Description: Including pain rating scale, medication(s)/side effects and non-pharmacologic comfort measures Outcome:  Progressing   Problem: Health Behavior/Discharge Planning: Goal: Ability to manage health-related needs will improve Outcome: Progressing   Problem: Clinical Measurements: Goal: Ability to maintain clinical measurements within normal limits will improve Outcome: Progressing Goal: Will remain free from infection Outcome: Progressing Goal: Diagnostic test results will improve Outcome: Progressing Goal: Respiratory complications will improve Outcome: Progressing Goal: Cardiovascular complication will be avoided Outcome: Progressing   Problem: Activity: Goal: Risk for activity intolerance will decrease Outcome: Progressing   Problem: Nutrition: Goal: Adequate nutrition will be maintained Outcome: Progressing   Problem: Coping: Goal: Level of anxiety will decrease Outcome: Progressing   Problem: Elimination: Goal: Will not experience complications related to bowel motility Outcome: Progressing Goal: Will not experience complications related to urinary retention Outcome: Progressing   Problem: Pain Managment: Goal: General experience of comfort will improve and/or be controlled Outcome: Progressing   Problem: Safety: Goal: Ability to remain free from injury will improve Outcome: Progressing   Problem: Skin Integrity: Goal: Risk for impaired skin integrity will decrease Outcome: Progressing   Problem: Education: Goal: Understanding of CV disease, CV risk reduction, and recovery process will improve Outcome: Progressing Goal: Individualized Educational Video(s) Outcome: Progressing   Problem: Activity: Goal: Ability to return to baseline activity level will improve Outcome: Progressing   Problem: Cardiovascular: Goal: Ability to achieve and maintain adequate cardiovascular perfusion will improve Outcome: Progressing Goal: Vascular access site(s) Level 0-1 will be maintained Outcome: Progressing   Problem: Health Behavior/Discharge Planning: Goal: Ability to  safely manage health-related needs after discharge will improve Outcome: Progressing   Problem: Education: Goal: Ability to describe self-care measures that may prevent or decrease complications (Diabetes Survival Skills Education) will improve Outcome: Progressing Goal: Individualized Educational Video(s) Outcome: Progressing   Problem: Cardiac: Goal: Ability to maintain an adequate cardiac output will improve Outcome: Progressing   Problem: Health Behavior/Discharge Planning: Goal: Ability to identify and utilize available resources and services will improve Outcome: Progressing Goal: Ability to manage health-related needs will improve Outcome: Progressing   Problem: Fluid Volume: Goal: Ability  to achieve a balanced intake and output will improve Outcome: Progressing   Problem: Metabolic: Goal: Ability to maintain appropriate glucose levels will improve Outcome: Progressing   Problem: Nutritional: Goal: Maintenance of adequate nutrition will improve Outcome: Progressing Goal: Maintenance of adequate weight for body size and type will improve Outcome: Progressing

## 2024-07-19 NOTE — Assessment & Plan Note (Signed)
 Improved gas exchange.   Plan to continue revefenacin  and arfomoterol Inhaled corticosteroid with budesonide  Airway clearing techniques with flutter valve and incentive spirometer.  Continue 02 monitoring and supplemental 02 per [place] to keep 02 saturation 88% or greater.

## 2024-07-19 NOTE — Assessment & Plan Note (Addendum)
 Immunosuppressive therapy with prednisone , cyclosporine  and azathioprine .

## 2024-07-19 NOTE — Assessment & Plan Note (Signed)
Continue anticoagulation with apixaban.  ?

## 2024-07-20 ENCOUNTER — Other Ambulatory Visit (HOSPITAL_COMMUNITY): Payer: Self-pay

## 2024-07-20 LAB — CBC
HCT: 38.2 % — ABNORMAL LOW (ref 39.0–52.0)
Hemoglobin: 12.2 g/dL — ABNORMAL LOW (ref 13.0–17.0)
MCH: 26.9 pg (ref 26.0–34.0)
MCHC: 31.9 g/dL (ref 30.0–36.0)
MCV: 84.3 fL (ref 80.0–100.0)
Platelets: 197 K/uL (ref 150–400)
RBC: 4.53 MIL/uL (ref 4.22–5.81)
RDW: 16.5 % — ABNORMAL HIGH (ref 11.5–15.5)
WBC: 11.5 K/uL — ABNORMAL HIGH (ref 4.0–10.5)
nRBC: 0.3 % — ABNORMAL HIGH (ref 0.0–0.2)

## 2024-07-20 LAB — BASIC METABOLIC PANEL WITH GFR
Anion gap: 10 (ref 5–15)
BUN: 83 mg/dL — ABNORMAL HIGH (ref 8–23)
CO2: 19 mmol/L — ABNORMAL LOW (ref 22–32)
Calcium: 8 mg/dL — ABNORMAL LOW (ref 8.9–10.3)
Chloride: 105 mmol/L (ref 98–111)
Creatinine, Ser: 2.37 mg/dL — ABNORMAL HIGH (ref 0.61–1.24)
GFR, Estimated: 30 mL/min — ABNORMAL LOW (ref >60)
Glucose, Bld: 180 mg/dL — ABNORMAL HIGH (ref 70–99)
Potassium: 5 mmol/L (ref 3.5–5.1)
Sodium: 134 mmol/L — ABNORMAL LOW (ref 135–145)

## 2024-07-20 LAB — GLUCOSE, CAPILLARY
Glucose-Capillary: 259 mg/dL — ABNORMAL HIGH (ref 70–99)
Glucose-Capillary: 167 mg/dL — ABNORMAL HIGH (ref 70–99)
Glucose-Capillary: 201 mg/dL — ABNORMAL HIGH (ref 70–99)
Glucose-Capillary: 254 mg/dL — ABNORMAL HIGH (ref 70–99)
Glucose-Capillary: 325 mg/dL — ABNORMAL HIGH (ref 70–99)

## 2024-07-20 MED ORDER — INSULIN GLARGINE-YFGN 100 UNIT/ML ~~LOC~~ SOPN: 14.0000 [IU] | PEN_INJECTOR | Freq: Every day | SUBCUTANEOUS | Status: DC

## 2024-07-20 MED ORDER — INSULIN ASPART 100 UNIT/ML IJ SOLN
300.0000 [IU] | Freq: Once | INTRAMUSCULAR | Status: AC
  Administered 2024-07-20: 300 [IU] via SUBCUTANEOUS

## 2024-07-20 MED ORDER — INSULIN ASPART 100 UNIT/ML IJ SOLN
0.0000 [IU] | Freq: Three times a day (TID) | INTRAMUSCULAR | Status: DC
  Administered 2024-07-20: 5 [IU] via SUBCUTANEOUS
  Administered 2024-07-20: 11 [IU] via SUBCUTANEOUS
  Filled 2024-07-20: qty 3
  Filled 2024-07-20: qty 11
  Filled 2024-07-20: qty 5

## 2024-07-20 MED ORDER — INSULIN PUMP
Freq: Three times a day (TID) | SUBCUTANEOUS | Status: DC
  Filled 2024-07-20: qty 1

## 2024-07-20 MED ORDER — ASPIRIN 81 MG PO TBEC
81.0000 mg | DELAYED_RELEASE_TABLET | Freq: Every day | ORAL | Status: DC
  Administered 2024-07-20 – 2024-07-22 (×3): 81 mg via ORAL
  Filled 2024-07-20 (×3): qty 1

## 2024-07-20 NOTE — Progress Notes (Addendum)
 Progress Note   Patient: Daniel Santos. FMW:[id number] DOB: [birth year] DOA: 07/17/2024     3 DOS: the patient was seen and examined on 07/20/2024   Brief hospital course: Mr. Gillen was admitted to the hospital with the working diagnosis of acute heart failure decompensation.   63 yo male with the past medical history of T1DM, sp pancreatic and renal transplantation, coronary artery disease, pulmonary hypertension, history of CVA and DVT, who presented with dyspnea. Patient reported worsening dyspnea on exertion along with lower extremity edema for about 2 days prior to admission, to the point where he was dyspneic with minimal efforts.  Because severe symptoms EMS was called, he was found hypoxemic 70% on room air and with increased work of breathing, he was placed on non re breather mask supplemental 02 and was transported to the ED.  On his initial physical examination his blood pressure was 115/63, HR 93, RR 33 and 02 saturation 97% on supplemental 02  Lungs with no wheezing, but positive rales and increased work of breathing, heart with S1 and S2 present and regular with no gallops or rubs, abdomen with no distention, and bilateral ankle edema.   VBG 7.31/ 42/ 41/ 20/ 71%  Na 137, K 5.8 Cl 105 bicarbonate 20, glucose 213 bun 36 cr 1,8  Anion gap 12  AST 24 ALT 18  BNP 456  High sensitive troponin 50, 118 , 137   Lactic acid 2,9  Wbc 9.9 hgb 14.7 plt 202   Chest radiograph with hyperinflation, no cardiomegaly, bilateral hilar vascular congestion, cephalization of the vasculature, bilateral central interstitial infiltrates.  Right lower lobe atelectasis   CT chest with no evidence of pulmonary embolus Small to moderate bilateral pleural effusions.  Bilateral ground glass opacities with interlobular septal thickening diffuse and bilateral   EKG 95 bpm, normal axis, normal intervals, qtc 477, sinus rhythm with bilateral atrial enlargement, positive LVH with poor RR wave progression,  no significant ST segment or T wave changes.   Patient was placed on furosemide .  Transfer to cardiac ICU for possible cardiogenic shock.  Cardiac catheterization with pulmonary hypertension, but preserved cardiac output and index. Normal filling pressures.   12/13 transferred back to [facility]. Discontinue central line  12/14 resume insulin  pump, possible discharge tomorrow.   Assessment and Plan: * Acute on chronic heart failure with preserved ejection fraction (HFpEF) (HCC) Echocardiogram with preserved LV systolic function with EF 55 to 60%, indeterminate diastolic parameters, RV systolic function preserved, RVSP 60,8 mmHg, mild mitral valve regurgitation.  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  Holding diuretic therapy for now, possible use of loop diuretic as needed as outpatient.  Resumed carvedilol  with good toleration   Acute hypoxemic respiratory failure, today 02 saturation is 90% on 4 L/min per [place], he may need home 02 at the time of discharge.  Will do home 02 screen tomorrow prior to discharge.   COPD exacerbation (HCC) Improved gas exchange.   Plan to continue revefenacin  and arfomoterol Inhaled corticosteroid with budesonide  Airway clearing techniques with flutter valve and incentive spirometer.  Continue 02 monitoring and supplemental 02 per [place] to keep 02 saturation 88% or greater.    Hypertension Continue blood pressure monitoring, resumed carvedilol  with good  toleration   Chronic kidney disease, stage 3a (HCC) AKI, hyperkalemia, hyponatremia   Renal function today with serum cr at 2,37 with K at 5,0 and serum bicarbonate at 19  Na 134 and anion gap 10   Continue to hold on diuretic  therapy Follow up renal function and electrolytes in am.   Type 1 diabetes mellitus (HCC) Uncontrolled hyperglycemia  He was diagnosed with DKA in the ICU (now resolved)  Fasting glucose this am 180 mg/dl Plan to resume insulin  pump and continue capillary glucose monitoring   Hypercholesteremia Continue with statin   History of simultaneous kidney and pancreas transplant (HCC) Immunosuppressive therapy with prednisone , cyclosporine  and azathioprine .   History of DVT (deep vein thrombosis) Continue anticoagulation with apixaban   History of stroke Continue blood pressure control and statin therapy.  Patient has bee on aspirin       Subjective: Patient with improvement in dyspnea, no chest pain, no nausea or vomiting, he has not been much out of bed due to 02 desaturation   Physical Exam: Vitals:   07/20/24 0700 07/20/24 0800 07/20/24 0849 07/20/24 1100  BP: 137/62 137/64 137/64 130/67  Pulse: 69 81 78 82  Resp: 11 14  19   Temp: 97.7 F (36.5 C) 98.7 F (37.1 C)  97.9 F (36.6 C)  TempSrc: Oral Oral  Oral  SpO2: 98% 98%  90%  Weight:      Height:       Neurology awake and alert, deconditioned ENT with mild pallor with no icterus Cardiovascular with S1 and S2 present and regular with no gallops or rubs, no murmurs Respiratory with prolonged expiratory phase and scattered rhonchi with no rales or wheezing Abdomen soft and non tender, not distended No lower extremity edema   Data Reviewed:    Family Communication: no family at the bedside   Disposition: Status is: Inpatient Remains inpatient appropriate because: recovering respiratory failure   Planned Discharge Destination: Home     Author: Elidia Toribio Furnace, MD 07/20/2024 1:45 PM  For on call review [email or web address].

## 2024-07-21 ENCOUNTER — Other Ambulatory Visit (HOSPITAL_COMMUNITY): Payer: Self-pay

## 2024-07-21 ENCOUNTER — Telehealth (HOSPITAL_COMMUNITY): Payer: Self-pay

## 2024-07-21 DIAGNOSIS — I5033 Acute on chronic diastolic (congestive) heart failure: Secondary | ICD-10-CM | POA: Diagnosis not present

## 2024-07-21 DIAGNOSIS — J441 Chronic obstructive pulmonary disease with (acute) exacerbation: Secondary | ICD-10-CM | POA: Diagnosis not present

## 2024-07-21 DIAGNOSIS — N1831 Chronic kidney disease, stage 3a: Secondary | ICD-10-CM | POA: Diagnosis not present

## 2024-07-21 DIAGNOSIS — I159 Secondary hypertension, unspecified: Secondary | ICD-10-CM | POA: Diagnosis not present

## 2024-07-21 LAB — BASIC METABOLIC PANEL WITH GFR
Anion gap: 6 (ref 5–15)
BUN: 92 mg/dL — ABNORMAL HIGH (ref 8–23)
CO2: 22 mmol/L (ref 22–32)
Calcium: 8 mg/dL — ABNORMAL LOW (ref 8.9–10.3)
Chloride: 107 mmol/L (ref 98–111)
Creatinine, Ser: 2.31 mg/dL — ABNORMAL HIGH (ref 0.61–1.24)
GFR, Estimated: 31 mL/min — ABNORMAL LOW (ref >60)
Glucose, Bld: 195 mg/dL — ABNORMAL HIGH (ref 70–99)
Potassium: 5.1 mmol/L (ref 3.5–5.1)
Sodium: 135 mmol/L (ref 135–145)

## 2024-07-21 LAB — GLUCOSE, CAPILLARY
Glucose-Capillary: 154 mg/dL — ABNORMAL HIGH (ref 70–99)
Glucose-Capillary: 186 mg/dL — ABNORMAL HIGH (ref 70–99)
Glucose-Capillary: 221 mg/dL — ABNORMAL HIGH (ref 70–99)
Glucose-Capillary: 322 mg/dL — ABNORMAL HIGH (ref 70–99)
Glucose-Capillary: 93 mg/dL (ref 70–99)

## 2024-07-21 MED ORDER — FUROSEMIDE 40 MG PO TABS
40.0000 mg | ORAL_TABLET | Freq: Every day | ORAL | Status: DC
  Administered 2024-07-21 – 2024-07-22 (×2): 40 mg via ORAL
  Filled 2024-07-21 (×2): qty 1

## 2024-07-21 NOTE — TOC Progression Note (Signed)
 Transition of Care (TOC) - Progression Note  Rayfield Gobble RN, BSN Inpatient Care Management Unit 4E- RN Case Manager See Treatment Team for direct phone #   Patient Details  Name: Daniel Santos. MRN: [id number] Date of Birth: [birth year]  Transition of [facility]) CM/SW Contact  Gobble, Rayfield Hurst, RN Phone Number: 07/21/2024, 4:30 PM  Clinical Narrative:    Notified by MD that pt will need home 02 on discharge, orders in, narrative placed for co-sign.  Pt has [facility] HMO plan that contracts all DME with [employer]. Referral sent via Hub as well as to [employer] liaison.  Portable 02 will be delivered for transport home prior to discharge.   IP CM to follow   Expected Discharge Plan: Home w Home Health Services Barriers to Discharge: Continued Medical Work up               Expected Discharge Plan and Services   Discharge Planning Services: CM Consult Post Acute Care Choice: Home Health Living arrangements for the past 2 months: Apartment                 DME Arranged: Oxygen DME Agency: [employer] Date DME Agency Contacted: 07/21/24 Time DME Agency Contacted: [id number] Representative spoke with at DME Agency: [facility]             Social Drivers of Health (SDOH) Interventions SDOH Screenings   Food Insecurity: No Food Insecurity (07/19/2024)  Housing: Low Risk (07/19/2024)  Transportation Needs: No Transportation Needs (07/19/2024)  Utilities: Not At Risk (07/19/2024)  Tobacco Use: Medium Risk (07/17/2024)    Readmission Risk Interventions     No data to display

## 2024-07-21 NOTE — Progress Notes (Addendum)
 Progress Note   Patient: Daniel Santos. FMW:[id number] DOB: February 06, 1961 DOA: 07/17/2024     4 DOS: the patient was seen and examined on 07/21/2024   Brief hospital course: Daniel Santos was admitted to the hospital with the working diagnosis of acute heart failure decompensation.   64 yo male with the past medical history of T1DM, sp pancreatic and renal transplantation, coronary artery disease, pulmonary hypertension, history of CVA and DVT, who presented with dyspnea. Patient reported worsening dyspnea on exertion along with lower extremity edema for about 2 days prior to admission, to the point where he was dyspneic with minimal efforts.  Because severe symptoms EMS was called, he was found hypoxemic 70% on room air and with increased work of breathing, he was placed on non re breather mask supplemental 02 and was transported to the ED.  On his initial physical examination his blood pressure was 115/63, HR 93, RR 33 and 02 saturation 97% on supplemental 02  Lungs with no wheezing, but positive rales and increased work of breathing, heart with S1 and S2 present and regular with no gallops or rubs, abdomen with no distention, and bilateral ankle edema.   VBG 7.31/ 42/ 41/ 20/ 71%  Na 137, K 5.8 Cl 105 bicarbonate 20, glucose 213 bun 36 cr 1,8  Anion gap 12  AST 24 ALT 18  BNP 456  High sensitive troponin 50, 118 , 137   Lactic acid 2,9  Wbc 9.9 hgb 14.7 plt 202   Chest radiograph with hyperinflation, no cardiomegaly, bilateral hilar vascular congestion, cephalization of the vasculature, bilateral central interstitial infiltrates.  Right lower lobe atelectasis   CT chest with no evidence of pulmonary embolus Small to moderate bilateral pleural effusions.  Bilateral ground glass opacities with interlobular septal thickening diffuse and bilateral   EKG 95 bpm, normal axis, normal intervals, qtc 477, sinus rhythm with bilateral atrial enlargement, positive LVH with poor RR wave progression,  no significant ST segment or T wave changes.   Patient was placed on furosemide .  Transfer to cardiac ICU for possible cardiogenic shock.  Cardiac catheterization with pulmonary hypertension, but preserved cardiac output and index. Normal filling pressures.   12/13 transferred back to TRH. Discontinue central line  12/14 resume insulin  pump, possible discharge tomorrow.  12/15 continue to have increase 02 requirements.   Assessment and Plan: * Acute on chronic heart failure with preserved ejection fraction (HFpEF) (HCC) Echocardiogram with preserved LV systolic function with EF 55 to 60%, indeterminate diastolic parameters, RV systolic function preserved, RVSP 60,8 mmHg, mild mitral valve regurgitation.  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   Resumed carvedilol  with good toleration  Continue diuresis with furosemide  40 mg po daily.   Acute hypoxemic respiratory failure, at rest on 3 L/min with 02 saturation 95%, on ambulation requires 6 L/min to keep 02 saturation above 88%. On room air at rest is 73%  Patient will need to continue supplemental 02 per [place] at home.   COPD exacerbation (HCC) Improved gas exchange.   Plan to continue revefenacin  and arfomoterol Inhaled corticosteroid with budesonide  Airway clearing techniques with flutter valve and incentive spirometer.  Continue 02 monitoring and supplemental 02 per [place] to keep 02 saturation 88% or greater.  Hypertension Continue blood pressure monitoring, resumed carvedilol  with good toleration   Chronic kidney disease, stage 3a (HCC) AKI, hyperkalemia, hyponatremia   Today renal function with serum cr at 2.31 with K at 5.1 and serum bicarbonate at 22  Na  135   Resumed diuretic therapy with furosemide  40 mg po daily.  Follow up renal function and electrolytes in am.   Type 1 diabetes mellitus (HCC) Uncontrolled hyperglycemia  He was diagnosed with DKA in the ICU (now resolved)  Fasting glucose this am 195 mg/dl On insulin   pump with good toleration   Hypercholesteremia Continue with statin   History of simultaneous kidney and pancreas transplant (HCC) Immunosuppressive therapy with prednisone , cyclosporine  and azathioprine .   History of DVT (deep vein thrombosis) Continue anticoagulation with apixaban   History of stroke Continue blood pressure control and statin therapy.  Patient has bee on aspirin          Subjective: Patient with no chest pain, continue to have 02 desaturation on ambulation, no chest pain, no PND, orthopnea or lower extremity edema   Physical Exam: Vitals:   07/21/24 0800 07/21/24 0905 07/21/24 0907 07/21/24 1249  BP: (!) 150/66 (!) 118/58 (!) 118/58 139/65  Pulse: 76 73 74 76  Resp:    18  Temp: 98.1 F (36.7 C)   98.3 F (36.8 C)  TempSrc: Oral   Oral  SpO2: 92% 99%  95%  Weight:      Height:       Neurology awake and alert ENT with mild pallor with no icterus Cardiovascular with S1 and S2 present and regular with no gallops, rubs or murmurs Respiratory with mild rales with no wheezing, no rhonchi, prolonged expiratory phase Abdomen with no distention, soft and non tender No lower extremity edema   Data Reviewed:    Family Communication: no family at the bedside   Disposition: Status is: Inpatient Remains inpatient appropriate because: recovering heart failure   Planned Discharge Destination: Home     Author: Elidia Toribio Furnace, MD 07/21/2024 3:33 PM  For on call review [email or web address].

## 2024-07-21 NOTE — Evaluation (Signed)
 Physical Therapy Evaluation Patient Details Name: Daniel Santos. 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 Pt reports he was able to get around without AD but that getting up the stairs and taking showers were increasingly difficult. Pt requiring 6L O2 via [place] for mobility today. Pt is mod I for bed mobility, supervision for transfers and contact guard for ambulation with RW. Pt provided with increased education about energy conservation and need for outsourcing things like bringing groceries up the stairs. Pt voices understanding. Pt appointment with Pulmonologist is not until 08/23/24 so PT recommending HHPT to work with pt on home specific opportunities for energy conservation. Plan for pt to discahrge tomorrow.        If plan is discharge home, recommend the following: A little help with walking and/or transfers;A little help with bathing/dressing/bathroom;Assistance with cooking/housework;Assist for transportation;Help with stairs or ramp for entrance   Can travel by private vehicle   Yes     Equipment Recommendations None recommended by PT     Functional Status Assessment Patient has had a recent decline in their functional status and demonstrates the ability to make significant improvements in function in a reasonable and predictable amount of time.     Precautions / Restrictions Precautions Precautions: Fall Recall of Precautions/Restrictions: Intact Restrictions Weight Bearing Restrictions Per Provider Order: No      Mobility  Bed Mobility Overal bed mobility: Modified Independent                   Transfers Overall transfer level: Needs assistance Equipment used: None, Rolling walker (2 wheels) Transfers: Sit to/from Stand Sit to Stand: Supervision           General transfer comment: supervision for safety to power up to RW.    Ambulation/Gait Ambulation/Gait assistance: Contact guard assist Gait Distance (Feet): 100 Feet (with standing rest break) Assistive device: Rolling walker (2 wheels) Gait Pattern/deviations: Step-through pattern, Decreased step length - right, Decreased step length - left, Decreased stance time - right, Antalgic Gait velocity: slowed Gait velocity interpretation: <1.31 ft/sec, indicative of household ambulator   General Gait Details: contact guard for safety, cuing for standing rest break, SpO2 on 6L O2 dropped to 86%O2, PT suggested standing rest break with purse lip breathing rebounded to 92%O2 before ambulation back to bed        Balance Overall balance assessment: Needs assistance Sitting-balance support: No upper extremity supported, Feet supported Sitting balance-Leahy Scale: Good     Standing balance support: No upper extremity supported, During functional activity Standing balance-Leahy Scale: Fair Standing balance comment: able to stand/ambulate without RW or assistance                             Pertinent Vitals/Pain  No pain     Home Living Family/patient expects to be discharged to:: Private residence Living Arrangements: Spouse/significant other Available Help at Discharge: Friend(s);Family;Available PRN/intermittently Type of Home: Apartment Home Access: Stairs to enter Entrance Stairs-Rails: Can reach both Entrance Stairs-Number of Steps: 3+5   Home Layout: One level Home Equipment: Agricultural Consultant (2 wheels);Rollator (  4 wheels);Shower seat;BSC/3in1 Additional Comments: Pt lives with significant other, she works from [employer],    Prior Function Prior Level of Function : Independent/Modified  Independent             Mobility Comments: ind, no falls ADLs Comments: mod I, uses shower seat     Extremity/Trunk Assessment   Upper Extremity Assessment Upper Extremity Assessment: Defer to OT evaluation    Lower Extremity Assessment Lower Extremity Assessment: Generalized weakness    Cervical / Trunk Assessment Cervical / Trunk Assessment: Kyphotic  Communication   Communication Communication: No apparent difficulties    Cognition Arousal: Alert Behavior During Therapy: WFL for tasks assessed/performed                             Following commands: Intact       Cueing Cueing Techniques: Verbal cues     General Comments General comments (skin integrity, edema, etc.): Extensive converstation with patient and his daughter about energy conservatil        Assessment/Plan    PT Assessment Patient needs continued PT services  PT Problem List Decreased activity tolerance;Decreased mobility;Cardiopulmonary status limiting activity       PT Treatment Interventions DME instruction;Gait training;Stair training;Functional mobility training;Therapeutic activities;Therapeutic exercise;Balance training;Patient/family education    PT Goals (Current goals can be found in the Care Plan section)  Acute Rehab PT Goals PT Goal Formulation: With patient Time For Goal Achievement: 08/04/24 Potential to Achieve Goals: Fair    Frequency Min 2X/week        AM-PAC PT 6 Clicks Mobility  Outcome Measure Help needed turning from your back to your side while in a flat bed without using bedrails?: None Help needed moving from lying on your back to sitting on the side of a flat bed without using bedrails?: A Little Help needed moving to and from a bed to a chair (including a wheelchair)?: A Little Help needed standing up from a chair using your arms (e.g., wheelchair or bedside chair)?: A Little Help needed to walk in hospital room?: A Little Help needed  climbing 3-5 steps with a railing? : A Little 6 Click Score: 19    End of Session Equipment Utilized During Treatment: Gait belt;Oxygen Activity Tolerance: Patient limited by fatigue Patient left: in bed;with call bell/phone within reach;with bed alarm set Nurse Communication: Mobility status PT Visit Diagnosis: Other abnormalities of gait and mobility (R26.89);Muscle weakness (generalized) (M62.81);Difficulty in walking, not elsewhere classified (R26.2)    Time: 8342-8260 PT Time Calculation (min) (ACUTE ONLY): 42 min   Charges:   PT Evaluation $PT Eval Moderate Complexity: 1 Mod PT Treatments $Gait Training: 8-22 mins $Self Care/Home Management: 8-22 PT General Charges $$ ACUTE PT VISIT: 1 Visit         Rashaunda Rahl B. Fleeta Lapidus PT, DPT Acute Rehabilitation Services Please use secure chat or  Call Office [phone]   Almarie KATHEE Fleeta [facility] 07/21/2024, 6:17 PM

## 2024-07-21 NOTE — TOC CM/SW Note (Signed)
 Per OT documentation on 07/21/24   SATURATION QUALIFICATIONS: (This note is used to comply with regulatory documentation for home oxygen)  Patient Saturations on Room Air at Rest = 73%  Patient Saturations on Room Air while Ambulating = N/A Patient saturations on 3L at rest- 95%  Patient Saturations on 6 Liters of oxygen while Ambulating/activity= 88%  Please briefly explain why patient needs home oxygen: Pt will need 3L of 02 at rest and 6L with exertion/activity.

## 2024-07-21 NOTE — TOC Initial Note (Addendum)
 Transition of Care (TOC) - Initial/Assessment Note    Patient Details  Name: Daniel Santos. MRN: [id number] Date of Birth: 07/08/1961  [facility]) CM/SW Contact:    Justina Delcia Czar, RN Phone Number: [phone] 07/21/2024, 10:27 AM  Clinical Narrative:                 Spoke to pt and states he was independent pta. Lives at home with SO. Pt drives to his appts. Has RW and scale at home. Educated pt on daily weights, low sodium diet/heart healthy diet and medication adherence. Provided pt with Living Better with HF booklet.   Pt will need prior auth on his transplant rejection medications. Has an appt with PCP but in hospital. Pharmacist will follow up with [facility] about prior auth.  Pt will need oxygen for home. Will need qualifying sats.   Will schedule hospital follow up appt. Sent to CMA basket to arrange hospital follow up appt. Scheduled dc tomorrow.   Expected Discharge Plan: Home w Home Health Services Barriers to Discharge: Continued Medical Work up   Patient Goals and CMS Choice Patient states their goals for this hospitalization and ongoing recovery are:: wants to remain independent CMS Medicare.gov Compare Post Acute Care list provided to:: Patient Choice offered to / list presented to : Patient      Expected Discharge Plan and Services   Discharge Planning Services: CM Consult Post Acute Care Choice: Home Health Living arrangements for the past 2 months: Apartment                                      Prior Living Arrangements/Services Living arrangements for the past 2 months: Apartment Lives with:: Significant Other Patient language and need for interpreter reviewed:: Yes Do you feel safe going back to the place where you live?: Yes      Need for Family Participation in Patient Care: No (Comment) Care giver support system in place?: Yes (comment) Current home services: DME (walker, scale) Criminal Activity/Legal Involvement  Pertinent to Current Situation/Hospitalization: No - Comment as needed  Activities of Daily Living      Permission Sought/Granted Permission sought to share information with : Case Manager, Family Supports, PCP Permission granted to share information with : Yes, Verbal Permission Granted  Share Information with NAME: Shanda Birk  Permission granted to share info w AGENCY: Home Health, DME, PCP  Permission granted to share info w Relationship: SO  Permission granted to share info w Contact Information: [phone]  Emotional Assessment Appearance:: Appears stated age Attitude/Demeanor/Rapport: Engaged Affect (typically observed): Accepting Orientation: : Oriented to Place, Oriented to Self, Oriented to  Time, Oriented to Situation   Psych Involvement: No (comment)  Admission diagnosis:  Acute respiratory failure, unspecified whether with hypoxia or hypercapnia (HCC) [J96.00] Acute on chronic right heart failure (HCC) [I50.813] Heart failure, unspecified HF chronicity, unspecified heart failure type (HCC) [I50.9] Acute on chronic heart failure with preserved ejection fraction (HFpEF) (HCC) [I50.33] Patient Active Problem List   Diagnosis Date Noted   COPD exacerbation (HCC) 07/19/2024   Pulmonary hypertension (HCC) 07/18/2024   Acute respiratory failure (HCC) 07/18/2024   Acute on chronic heart failure with preserved ejection fraction (HFpEF) (HCC) 07/17/2024   Hyperbilirubinemia 07/17/2024   History of simultaneous kidney and pancreas transplant (HCC) 07/17/2024   History of DVT (deep vein thrombosis) 07/17/2024   Hyperkalemia 07/17/2024  Chronic retention of urine 07/17/2024   Acute respiratory failure with hypoxia (HCC) 07/17/2024   Acute on chronic right heart failure (HCC) 07/17/2024   Pleural effusion 07/17/2024   Hematuria of unknown cause 07/08/2019   Elbow swelling, right 07/08/2019   Chronic erosive gastritis 05/27/2019   Dyspepsia 05/27/2019   Loss of weight  05/27/2019   Renal disorder    Hypertension    Hypercholesteremia    CVA (cerebral vascular accident) (HCC) 06/10/2017   Chronic kidney disease, stage 3a (HCC) 06/09/2017   CKD (chronic kidney disease), stage IV (HCC) 03/23/2017   Microcytic anemia 03/23/2017   History of stroke    Palpitations 12/14/2015   Hemiparesis and alteration of sensations as late effects of stroke (HCC) 03/25/2015   Left pontine cerebrovascular accident (HCC) 01/12/2015   Primary hypertension 01/12/2015   Tobacco abuse 01/12/2015   Type 1 diabetes mellitus (HCC) 02/12/2009   PCP:  Lenon Nell SAILOR, FNP Pharmacy:   [facility] [id number] - [address], [place] - [address] [place] [address] Phone: [phone] Fax: [phone]  [place] [facility] [address] [place] [address] Phone: [phone] Fax: [phone]  [facility] - [place], [place] - [address] [place] [address] Phone: [phone] Fax: [phone]     Social Drivers of Health (SDOH) Social History: SDOH Screenings   Food Insecurity: No Food Insecurity (07/19/2024)  Housing: Low Risk (07/19/2024)  Transportation Needs: No Transportation Needs (07/19/2024)  Utilities: Not At Risk (07/19/2024)  Tobacco Use: Medium Risk (07/17/2024)   SDOH Interventions:     Readmission Risk Interventions     No data to display

## 2024-07-21 NOTE — Evaluation (Signed)
 Occupational Therapy Evaluation Patient Details Name: Daniel Santos. 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 Pt desats quickly to 73% on RA, requires 3L O2 to return to 95% saturation or more at rest. Pt requires 6L O2 during activity to remain above 88%. Pt lives with girlfriends who works 3am-4pm. 3+5 [employer], has trouble with them at baseline due to fatigue. Pt currently requires set up/supervision for ADLs and mobility, increased time for activities due to fatigue. Pt IV began to mildly bleed while using the bathroom due to BP cuff squeezing his arm, RN informed. Pt ambulated 40 feet with increased time with and without RW, encouraged use of RW for support due to antalgic gait. Pt able to complete UB/LB dressing at bedside with increased time. Recommending pulm rehab follow up, although Pt reports may not be able to get transportation, if so, recommending HHOT follow up. Will continue to see acutely to progress as able.      If plan is discharge home, recommend the following:   A little help with walking and/or transfers;A little help with bathing/dressing/bathroom;Assistance with cooking/housework;Assist for transportation;Help with stairs or ramp for entrance     Functional Status Assessment   Patient has had a recent decline in their functional status and demonstrates the ability to make significant improvements in function in a reasonable and predictable amount of time.     Equipment Recommendations   None recommended by OT     Recommendations for Other Services         Precautions/Restrictions   Precautions Precautions: Fall Recall of  Precautions/Restrictions: Intact Restrictions Weight Bearing Restrictions Per Provider Order: No     Mobility Bed Mobility Overal bed mobility: Modified Independent                  Transfers Overall transfer level: Needs assistance Equipment used: None, Rolling walker (2 wheels) Transfers: Sit to/from Stand Sit to Stand: Supervision           General transfer comment: supervision for safety, able to ambulate with/without RW, antalgic gait without RW.      Balance Overall balance assessment: Needs assistance Sitting-balance support: No upper extremity supported, Feet supported Sitting balance-Leahy Scale: Good     Standing balance support: No upper extremity supported, During functional activity Standing balance-Leahy Scale: Fair Standing balance comment: able to stand/ambulate without RW or assistance                           ADL either performed or assessed with clinical judgement   ADL Overall ADL's : Needs assistance/impaired                                       General ADL Comments: set up/supervision     Vision Baseline Vision/History: 1 Wears glasses Ability to See in Adequate Light: 0 Adequate Patient Visual Report: No change from baseline       Perception         Praxis         Pertinent  Vitals/Pain Pain Assessment Pain Assessment: No/denies pain     Extremity/Trunk Assessment Upper Extremity Assessment Upper Extremity Assessment: Overall WFL for tasks assessed           Communication Communication Communication: No apparent difficulties   Cognition Arousal: Alert Behavior During Therapy: WFL for tasks assessed/performed Cognition: No apparent impairments                               Following commands: Intact       Cueing  General Comments   Cueing Techniques: Verbal cues  Pt BP sitting EOB 165/64, after activity 170/68. Pt O2 drops to 73% at rest on RA, requires 3L at  rest to return to 95%. Pt requires 6L O2 during activity to maintain above 88% O2 saturation   Exercises     Shoulder Instructions      Home Living Family/patient expects to be discharged to:: Private residence Living Arrangements: Spouse/significant other Available Help at Discharge: Friend(s);Family;Available PRN/intermittently Type of Home: Apartment Home Access: Stairs to enter Entrance Stairs-Number of Steps: 3+5 Entrance Stairs-Rails: Can reach both Home Layout: One level     Bathroom Shower/Tub: Tub/shower unit         Home Equipment: Agricultural Consultant (2 wheels);Rollator (4 wheels);Shower seat;BSC/3in1   Additional Comments: Pt lives with significant other, she works from [employer] to [employer],      Prior Functioning/Environment Prior Level of Function : Independent/Modified Independent             Mobility Comments: ind, no falls ADLs Comments: mod I, uses shower seat    OT Problem List: Decreased strength;Decreased activity tolerance;Impaired balance (sitting and/or standing);Pain;Cardiopulmonary status limiting activity   OT Treatment/Interventions: Self-care/ADL training;Therapeutic exercise;Energy conservation;DME and/or AE instruction;Therapeutic activities;Patient/family education;Balance training      OT Goals(Current goals can be found in the care plan section)   Acute Rehab OT Goals Patient Stated Goal: to improve breathing, strength OT Goal Formulation: With patient Time For Goal Achievement: 08/04/24 Potential to Achieve Goals: Good   OT Frequency:  Min 2X/week    Co-evaluation              AM-PAC OT 6 Clicks Daily Activity     Outcome Measure Help from another person eating meals?: None Help from another person taking care of personal grooming?: A Little Help from another person toileting, which includes using toliet, bedpan, or urinal?: A Little Help from another person bathing (including washing, rinsing, drying)?: A Little Help from  another person to put on and taking off regular upper body clothing?: None Help from another person to put on and taking off regular lower body clothing?: A Little 6 Click Score: 20   End of Session Equipment Utilized During Treatment: Gait belt;Rolling walker (2 wheels) Nurse Communication: Mobility status  Activity Tolerance: Patient tolerated treatment well Patient left: in bed;with call bell/phone within reach  OT Visit Diagnosis: Unsteadiness on feet (R26.81);Other abnormalities of gait and mobility (R26.89);Muscle weakness (generalized) (M62.81);Pain Pain - part of body:  (chest, back)                Time: 1129-1220 OT Time Calculation (min): 51 min Charges:  OT General Charges $OT Visit: 1 Visit OT Evaluation $OT Eval Low Complexity: 1 Low OT Treatments $Self Care/Home Management : 23-37 mins  [address], OTR/L   Daniel Santos 07/21/2024, 12:58 PM

## 2024-07-21 NOTE — Telephone Encounter (Signed)
 Pharmacy Patient Advocate Encounter  Insurance verification completed.    The patient is insured through [employer]. Patient has Medicare and is not eligible for a copay card, but may be able to apply for patient assistance or Medicare RX Payment Plan (Patient Must reach out to their plan, if eligible for payment plan), if available.    Ran test claim for azathioprine  100 MG tablet and the current 30 day co-pay is $33.97.  Ran test claim for cycloSPORINE  100 MG capsule and the current 30 day co-pay is $76.34.  This test claim was processed through  [facility]- copay amounts may vary at other pharmacies due to pharmacy/plan contracts, or as the patient moves through the different stages of their insurance plan.

## 2024-07-22 ENCOUNTER — Other Ambulatory Visit (HOSPITAL_COMMUNITY): Payer: Self-pay

## 2024-07-22 ENCOUNTER — Telehealth (HOSPITAL_COMMUNITY): Payer: Self-pay

## 2024-07-22 LAB — BASIC METABOLIC PANEL WITH GFR
Anion gap: 11 (ref 5–15)
Anion gap: 9 (ref 5–15)
BUN: 79 mg/dL — ABNORMAL HIGH (ref 8–23)
BUN: 70 mg/dL — ABNORMAL HIGH (ref 8–23)
CO2: 22 mmol/L (ref 22–32)
CO2: 25 mmol/L (ref 22–32)
Calcium: 8.3 mg/dL — ABNORMAL LOW (ref 8.9–10.3)
Calcium: 8.7 mg/dL — ABNORMAL LOW (ref 8.9–10.3)
Chloride: 107 mmol/L (ref 98–111)
Chloride: 105 mmol/L (ref 98–111)
Creatinine, Ser: 1.83 mg/dL — ABNORMAL HIGH (ref 0.61–1.24)
Creatinine, Ser: 1.56 mg/dL — ABNORMAL HIGH (ref 0.61–1.24)
GFR, Estimated: 41 mL/min — ABNORMAL LOW (ref >60)
GFR, Estimated: 49 mL/min — ABNORMAL LOW (ref >60)
Glucose, Bld: 114 mg/dL — ABNORMAL HIGH (ref 70–99)
Glucose, Bld: 155 mg/dL — ABNORMAL HIGH (ref 70–99)
Potassium: 5.3 mmol/L — ABNORMAL HIGH (ref 3.5–5.1)
Potassium: 4.9 mmol/L (ref 3.5–5.1)
Sodium: 140 mmol/L (ref 135–145)
Sodium: 140 mmol/L (ref 135–145)

## 2024-07-22 LAB — GLUCOSE, CAPILLARY
Glucose-Capillary: 115 mg/dL — ABNORMAL HIGH (ref 70–99)
Glucose-Capillary: 132 mg/dL — ABNORMAL HIGH (ref 70–99)
Glucose-Capillary: 215 mg/dL — ABNORMAL HIGH (ref 70–99)

## 2024-07-22 LAB — CULTURE, BLOOD (ROUTINE X 2)
Culture: NO GROWTH
Culture: NO GROWTH
Special Requests: ADEQUATE

## 2024-07-22 MED ORDER — AZATHIOPRINE 50 MG PO TABS
100.0000 mg | ORAL_TABLET | Freq: Every day | ORAL | 0 refills | Status: AC
  Filled 2024-07-22: qty 60, 30d supply, fill #0

## 2024-07-22 MED ORDER — FUROSEMIDE 40 MG PO TABS
40.0000 mg | ORAL_TABLET | Freq: Every day | ORAL | 0 refills | Status: AC
  Filled 2024-07-22: qty 30, 30d supply, fill #0

## 2024-07-22 MED ORDER — PREDNISONE 10 MG PO TABS: 10.0000 mg | ORAL_TABLET | Freq: Every day | ORAL | 0 refills | Status: AC

## 2024-07-22 MED ORDER — CYCLOSPORINE MODIFIED (NEORAL) 100 MG PO CAPS
100.0000 mg | ORAL_CAPSULE | Freq: Two times a day (BID) | ORAL | 0 refills | Status: DC
  Filled 2024-07-22: qty 60, 30d supply, fill #0

## 2024-07-22 MED ORDER — FLUTICASONE FUROATE-VILANTEROL 200-25 MCG/ACT IN AEPB
1.0000 | INHALATION_SPRAY | Freq: Every day | RESPIRATORY_TRACT | 0 refills | Status: AC
  Filled 2024-07-22: qty 60, 30d supply, fill #0

## 2024-07-22 MED ORDER — FLUTICASONE FUROATE-VILANTEROL 200-25 MCG/ACT IN AEPB
1.0000 | INHALATION_SPRAY | Freq: Every day | RESPIRATORY_TRACT | Status: DC
  Filled 2024-07-22: qty 28

## 2024-07-22 MED ORDER — PREDNISONE 10 MG PO TABS
10.0000 mg | ORAL_TABLET | Freq: Every day | ORAL | 0 refills | Status: DC
  Filled 2024-07-22: qty 30, 30d supply, fill #0

## 2024-07-22 MED ORDER — SODIUM ZIRCONIUM CYCLOSILICATE 10 G PO PACK
10.0000 g | PACK | Freq: Once | ORAL | Status: AC
  Administered 2024-07-22: 11:00:00 10 g via ORAL
  Filled 2024-07-22: qty 1

## 2024-07-22 MED ORDER — CYCLOSPORINE MODIFIED (NEORAL) 100 MG PO CAPS: 100.0000 mg | ORAL_CAPSULE | Freq: Two times a day (BID) | ORAL | 0 refills | Status: AC

## 2024-07-22 NOTE — TOC Transition Note (Signed)
 Transition of Care [facility]) - Discharge Note Rayfield Gobble RN, BSN Inpatient Care Management Unit 4E- RN Case Manager See Treatment Team for direct phone #   Patient Details  Name: Daniel Santos. MRN: [id number] Date of Birth: 1960/10/21  Transition of Care [facility]) CM/SW Contact:  Gobble Rayfield Hurst, RN Phone Number: 07/22/2024, 3:13 PM   Clinical Narrative:    Per MD pt will likely transition home later this afternoon pending lab results. HH orders have been placed for HHPTOT.   CM spoke with pt this am and provided [facility] choice with list Per CMS guidelines from [email or web address] website with star ratings (copy placed in shadow chart) Pt wanted to review list.   Follow up was done for choice- pt voiced he has selected  [facility].   Home 02 portable device has been delivered by [employer] and pt has spoken with [employer] regarding home delivery and who to contact when he gets home.  Referral for [facility] sent to [facility] via Hub- referral has been accepted- info placed on AVS.   Family to transport home.   IP CM interventions have been completed no further needs noted.   Final next level of care: Home w Home Health Services Barriers to Discharge: Barriers Resolved   Patient Goals and CMS Choice Patient states their goals for this hospitalization and ongoing recovery are:: wants to remain independent CMS Medicare.gov Compare Post Acute Care list provided to:: Patient Choice offered to / list presented to : Patient      Discharge Placement               Home w/ [facility]        Discharge Plan and Services Additional resources added to the After Visit Summary for     Discharge Planning Services: CM Consult Post Acute Care Choice: Home Health          DME Arranged: Oxygen DME Agency: [facility] Date DME Agency Contacted: 07/21/24 Time DME Agency Contacted: 1626 Representative spoke with at DME Agency: [facility] HH Arranged: PT, OT HH Agency: [facility] Date [facility] Agency  Contacted: 07/22/24 Time HH Agency Contacted: 1500 Representative spoke with at [facility] Agency: [facility]  Social Drivers of Health (SDOH) Interventions SDOH Screenings   Food Insecurity: No Food Insecurity (07/19/2024)  Housing: Low Risk (07/19/2024)  Transportation Needs: No Transportation Needs (07/19/2024)  Utilities: Not At Risk (07/19/2024)  Tobacco Use: Medium Risk (07/17/2024)     Readmission Risk Interventions    07/22/2024    3:00 PM  Readmission Risk Prevention Plan  Transportation Screening Complete  PCP or Specialist Appt within 3-5 Days Complete  HRI or Home Care Consult Complete  Social Work Consult for Recovery Care Planning/Counseling Complete  Palliative Care Screening Not Applicable  Medication Review Ocean[employer]) Complete

## 2024-07-22 NOTE — Plan of Care (Signed)
°  Problem: Education: Goal: Ability to describe self-care measures that may prevent or decrease complications (Diabetes Survival Skills Education) will improve Outcome: Adequate for Discharge Goal: Individualized Educational Video(s) Outcome: Adequate for Discharge   Problem: Coping: Goal: Ability to adjust to condition or change in health will improve Outcome: Adequate for Discharge   Problem: Fluid Volume: Goal: Ability to maintain a balanced intake and output will improve Outcome: Adequate for Discharge   Problem: Health Behavior/Discharge Planning: Goal: Ability to identify and utilize available resources and services will improve Outcome: Adequate for Discharge Goal: Ability to manage health-related needs will improve Outcome: Adequate for Discharge   Problem: Metabolic: Goal: Ability to maintain appropriate glucose levels will improve Outcome: Adequate for Discharge   Problem: Nutritional: Goal: Maintenance of adequate nutrition will improve Outcome: Adequate for Discharge Goal: Progress toward achieving an optimal weight will improve Outcome: Adequate for Discharge   Problem: Skin Integrity: Goal: Risk for impaired skin integrity will decrease Outcome: Adequate for Discharge   Problem: Tissue Perfusion: Goal: Adequacy of tissue perfusion will improve Outcome: Adequate for Discharge   Problem: Education: Goal: Ability to demonstrate management of disease process will improve Outcome: Adequate for Discharge Goal: Ability to verbalize understanding of medication therapies will improve Outcome: Adequate for Discharge Goal: Individualized Educational Video(s) Outcome: Adequate for Discharge   Problem: Activity: Goal: Capacity to carry out activities will improve Outcome: Adequate for Discharge   Problem: Cardiac: Goal: Ability to achieve and maintain adequate cardiopulmonary perfusion will improve Outcome: Adequate for Discharge   Problem: Education: Goal:  Knowledge of General Education information will improve Description: Including pain rating scale, medication(s)/side effects and non-pharmacologic comfort measures Outcome: Adequate for Discharge

## 2024-07-22 NOTE — Discharge Summary (Addendum)
 Physician Discharge Summary   Patient: Daniel Santos. MRN: [id number] DOB: [birth year]  Admit date:     07/17/2024  Discharge date: 07/22/2024  Discharge Physician: Elidia Sieving Kynzli Rease   PCP: Lenon Nell SAILOR, FNP   Recommendations at discharge:    Patient has been placed on furosemide  for diuresis.  Added inhaled corticosteroids and long acting b agonist Added supplemental 02 per [place] to keep 02 saturation 88% or grater.  Follow up renal function and electrolytes in 7 days as outpatient  Follow up with Nell Lenon FNP in 7 to 10 days Follow up with Cardiology as scheduled.   Discharge Diagnoses: Principal Problem:   Acute on chronic heart failure with preserved ejection fraction (HFpEF) (HCC) Active Problems:   COPD exacerbation (HCC)   Hypertension   Chronic kidney disease, stage 3a (HCC)   Type 1 diabetes mellitus (HCC)   Hypercholesteremia   History of simultaneous kidney and pancreas transplant (HCC)   History of DVT (deep vein thrombosis)   History of stroke  Resolved Problems:   * No resolved hospital problems. [facility] Course: Mr. Moone was admitted to the hospital with the working diagnosis of acute heart failure decompensation.   63 yo male with the past medical history of T1DM, sp pancreatic and renal transplantation, coronary artery disease, pulmonary hypertension, history of CVA and DVT, who presented with dyspnea. Patient reported worsening dyspnea on exertion along with lower extremity edema for about 2 days prior to admission, to the point where he was dyspneic with minimal efforts.  Because severe symptoms EMS was called, he was found hypoxemic 70% on room air and with increased work of breathing, he was placed on non re breather mask supplemental 02 and was transported to the ED.  On his initial physical examination his blood pressure was 115/63, HR 93, RR 33 and 02 saturation 97% on supplemental 02  Lungs with no wheezing, but positive rales and  increased work of breathing, heart with S1 and S2 present and regular with no gallops or rubs, abdomen with no distention, and bilateral ankle edema.   VBG 7.31/ 42/ 41/ 20/ 71%  Na 137, K 5.8 Cl 105 bicarbonate 20, glucose 213 bun 36 cr 1,8  Anion gap 12  AST 24 ALT 18  BNP 456  High sensitive troponin 50, 118 , 137   Lactic acid 2,9  Wbc 9.9 hgb 14.7 plt 202   Chest radiograph with hyperinflation, no cardiomegaly, bilateral hilar vascular congestion, cephalization of the vasculature, bilateral central interstitial infiltrates.  Right lower lobe atelectasis   CT chest with no evidence of pulmonary embolus Small to moderate bilateral pleural effusions.  Bilateral ground glass opacities with interlobular septal thickening diffuse and bilateral   EKG 95 bpm, normal axis, normal intervals, qtc 477, sinus rhythm with bilateral atrial enlargement, positive LVH with poor RR wave progression, no significant ST segment or T wave changes.   Patient was placed on furosemide .  Transfer to cardiac ICU for possible cardiogenic shock.  Cardiac catheterization with pulmonary hypertension, but preserved cardiac output and index. Normal filling pressures.   12/13 transferred back to [facility]. Discontinue central line  12/14 resume insulin  pump, possible discharge tomorrow.  12/15 continue to have increase 02 requirements.  12/16 patient clinically stable for discharge.   Assessment and Plan: * Acute on chronic heart failure with preserved ejection fraction (HFpEF) (HCC) Echocardiogram with preserved LV systolic function with EF 55 to 60%, indeterminate diastolic parameters, RV systolic function preserved, RVSP 60,8  mmHg, mild mitral valve regurgitation.  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   Continue carvedilol  and furosemide .  Limited medical therapy due to hyperkalemia.   Acute hypoxemic respiratory failure, at rest on 3 L/min with 02 saturation 95%, on ambulation requires 6 L/min to keep 02 saturation above 88%. On room air at rest is 73%  Patient will need to continue supplemental 02 per [place] at home.   COPD exacerbation (HCC) Improved gas exchange.   Plan to continue LABA and ICS Airway clearing techniques with flutter valve and incentive spirometer.  Continue 02 monitoring and supplemental 02 per [place] to keep 02 saturation 88% or greater.    Hypertension Continue with carvedilol  for blood pressure control.    Chronic kidney disease, stage 3a (HCC) AKI, hyperkalemia, hyponatremia   At the time of her discharge his renal function had a serum cr at 1,83 with K at 5,3 and serum bicarbonate at 22 Na 140  He had sodium zirconium this morning and will recheck K prior to his discharge.  Addendum: follow up K is 4,9 with serum cr at 1,56 and bicarbonate at 25, Na 140  Continue furosemide  40 mg po daily and follow up renal function as outpatient   Type 1 diabetes mellitus (HCC) Uncontrolled hyperglycemia  He was diagnosed with DKA in the ICU (now resolved)  Fasting glucose this am 114 mg/dl Plan to continue with insulin   pump with good toleration   Hypercholesteremia Continue with statin   History of simultaneous kidney and pancreas transplant (HCC) Immunosuppressive therapy with prednisone , cyclosporine  and azathioprine .   History of DVT (deep vein thrombosis) Continue anticoagulation with apixaban   History of stroke Continue blood pressure control and statin therapy.  Patient has bee on aspirin           Consultants: cardiology  Procedures performed: cardiac catheterization   Disposition: Home Diet recommendation:  Cardiac and Carb modified diet DISCHARGE MEDICATION: Allergies as  of 07/22/2024       Reactions   Doxycycline  Nausea Only, Other (See Comments)   GI upset, kidney function        Medication List     STOP taking these medications    thiamine  100 MG tablet Commonly known as: VITAMIN B1       TAKE these medications    acetaminophen  500 MG tablet Commonly known as: TYLENOL  Take 1,000 mg by mouth every 6 (six) hours as needed (pain).   apixaban  5 MG Tabs tablet Commonly known as: Eliquis  Take 1 tablet (5 mg total) by mouth 2 (two) times daily. Start taking after completion of starter pack. What changed: Another medication with the same name was removed. Continue taking this medication, and follow the directions you see here.   aspirin  EC 81 MG tablet Take 1 tablet (81 mg total) by mouth daily. Swallow whole.   atorvastatin  40 MG tablet Commonly known as: LIPITOR Take 1 tablet (40 mg total) every evening by mouth. What changed: when to take this   azaTHIOprine  50 MG tablet Commonly known as: IMURAN  Take 2 tablets (100 mg total) by mouth daily. What changed: medication strength   Bactrim 400-80 MG tablet Generic drug: sulfamethoxazole-trimethoprim Take 1 tablet 3 times a week by oral route as  directed for 90 days, for M, W, F.   Butrans  20 MCG/HR Ptwk Generic drug: buprenorphine  Place 20 mcg onto the skin every Monday.   carvedilol  12.5 MG tablet Commonly known as: Coreg  Take 1 tablet (12.5 mg total) by mouth 2 (two) times daily with a meal.   cycloSPORINE  modified 100 MG capsule Commonly known as: NEORAL  Take 1 capsule (100 mg total) by mouth 2 (two) times daily.   fluticasone  furoate-vilanterol 200-25 MCG/ACT Aepb Commonly known as: BREO ELLIPTA  Inhale 1 puff into the lungs daily.   furosemide  40 MG tablet Commonly known as: LASIX  Take 1 tablet (40 mg total) by mouth daily. Start taking on: July 23, 2024   insulin  lispro 100 UNIT/ML injection Commonly known as: HUMALOG See admin instructions. Uses via insulin   pump per bolus rate   Linzess  145 MCG Caps capsule Generic drug: linaclotide  Take 145 mcg by mouth daily as needed (for constipation).   predniSONE  10 MG tablet Commonly known as: DELTASONE  Take 1 tablet (10 mg total) by mouth daily.   tamsulosin  0.4 MG Caps capsule Commonly known as: FLOMAX  Take 0.4 mg by mouth 2 (two) times daily.               Durable Medical Equipment  (From admission, onward)           Start     Ordered   07/21/24 1533  For home use only DME oxygen  Once       Comments: Use 3 L/min at rest and 6 L/min on exertion  Question Answer Comment  Length of Need 12 Months   Mode or (Route) Nasal cannula   Liters per Minute 6   Frequency Continuous (stationary and portable oxygen unit needed)   Oxygen conserving device Yes   Oxygen delivery system: Gas      07/21/24 1532            Follow-up Information     [facility] Follow up on 08/04/2024.   Specialty: Cardiology Why: 08/04/24 at 2 pm Contact information: [address] [place]  [address] [phone]        Gari Lauraine BRAVO, FNP Follow up.   Specialty: Family Medicine Why: TIME : 8:50 AM  PLEASE ARRIVE AT 8:30 AM DATE : August 06, 2024 WEDNESDAY  PLEASE BRING ALL CURRENT MEDICATION, ID and INS CARD , CO-PAY Contact information: [address] [place] [address] [place] [address] [phone]         [facility] (DME) Follow up.   Specialty: DME Services Why: home 02 arranged- they will bring portable 02 for pt to use on transport home- deliver home concentrator to the home Contact information: [address] [facility] [place]  [phone]               Discharge Exam: Filed Weights   07/20/24 0300 07/21/24 0603 07/22/24 0651  Weight: 67.9 kg 68.1 kg 68.3 kg   BP (!) 139/58 (BP Location: Right Arm)   Pulse 78   Temp 98.1 F (36.7 C) (Oral)   Resp 14    Ht 5' 9 (1.753 m)   Wt 68.3 kg   SpO2 97%   BMI 22.24 kg/m   Patient with no chest pain, dyspnea has improved, he has not PND, orthopnea or lower extremity edema  Neurology awake and alert ENT with mild pallor with no icterus Cardiovascular with S1 and S2 present and regular with no  gallops, rubs or murmurs Respiratory with prolonged expiratory phase with no wheezing or rhonchi, no rales Abdomen with no distention, soft and non tender No lower extremity edema   Condition at discharge: stable  The results of significant diagnostics from this hospitalization (including imaging, microbiology, ancillary and laboratory) are listed below for reference.   Imaging Studies: DG CHEST PORT 1 VIEW Result Date: 07/17/2024 EXAM: 1 VIEW(S) XRAY OF THE CHEST 07/17/2024 05:24:00 PM COMPARISON: 07/17/2024 CLINICAL HISTORY: Hypoxia. FINDINGS: LINES, TUBES AND DEVICES: New right IJ Swan-Ganz catheter with its tip directed peripherally into right lower lobe pulmonary artery. Consider withdrawal approximately 7 cm. LUNGS AND PLEURA: Small left pleural effusion. Improved mild pulmonary edema. Persistent interstitial changes. No pneumothorax. HEART AND MEDIASTINUM: No acute abnormality of the cardiac and mediastinal silhouettes. BONES AND SOFT TISSUES: No acute osseous abnormality. IMPRESSION: 1. Right IJ Swan-Ganz catheter with the tip directed peripherally into the right lower lobe pulmonary artery; recommend withdrawing approximately 7 cm. 2. Small left pleural effusion. 3. Mild pulmonary edema has improved. Electronically signed by: Pinkie Pebbles MD 07/17/2024 05:35 PM EST RP Workstation: [id number]   CARDIAC CATHETERIZATION Result Date: 07/17/2024 1. Normal RA pressure and PCWP. 2. Moderate pulmonary arterial hypertension with elevated PVR. 3. Preserved cardiac output and PAPi.   US  Renal Transplant w/Doppler Result Date: 07/17/2024 CLINICAL DATA:  Acute kidney injury. EXAM: ULTRASOUND OF RENAL  TRANSPLANT WITH RENAL DOPPLER ULTRASOUND TECHNIQUE: Ultrasound examination of the renal transplant was performed with gray-scale, color and duplex doppler evaluation. COMPARISON:  None Available. FINDINGS: Transplant kidney location: Left lower quadrant Transplant Kidney: Renal measurements: 8.6 x 5.0 x 5.9 cm = volume: . Mild fullness noted intrarenal collecting system without overt hydronephrosis. Color flow in the main renal artery:  Minimal Color flow in the main renal vein:  Yes Duplex Doppler Evaluation: Main Renal Artery Velocity: 300 cm/sec Main Renal Artery Resistive Index: 0.9 Venous waveform in main renal vein:  Present Intrarenal resistive index in upper pole:  0.8 (normal 0.6-0.8; equivocal 0.8-0.9; abnormal >= 0.9) Intrarenal resistive index in lower pole: 0.8 (normal 0.6-0.8; equivocal 0.8-0.9; abnormal >= 0.9) Bladder: Decompressed by Foley catheter. Other findings:  No peritransplant fluid collection. IMPRESSION: 1. Mild fullness of the intrarenal collecting system of the left lower quadrant renal transplant without overt hydronephrosis. 2. Borderline elevated resistive indices in the main renal artery and intrarenal arteries. Electronically Signed   By: Camellia Candle M.D.   On: 07/17/2024 07:24   CT Angio Chest PE W and/or Wo Contrast Result Date: 07/17/2024 EXAM: CTA of the Chest with contrast for PE 07/17/2024 02:46:06 AM TECHNIQUE: CTA of the chest was performed without and with the administration of 75 mL of iohexol  (OMNIPAQUE ) 350 MG/ML injection. Multiplanar reformatted images are provided for review. MIP images are provided for review. Automated exposure control, iterative reconstruction, and/or weight based adjustment of the mA/kV was utilized to reduce the radiation dose to as low as reasonably achievable. COMPARISON: 04/29/2024 CLINICAL HISTORY: Pulmonary embolism (PE) suspected, high prob. FINDINGS: PULMONARY ARTERIES: Pulmonary arteries are adequately opacified for evaluation.  No pulmonary embolism. Main pulmonary artery is normal in caliber. MEDIASTINUM: Coronary artery atherosclerosis. There is aortic atherosclerosis. LYMPH NODES: No mediastinal, hilar or axillary lymphadenopathy. LUNGS AND PLEURA: Compressive atelectasis in the lower lobes. Small to moderate bilateral pleural effusions. No focal consolidation or pulmonary edema. No pneumothorax. UPPER ABDOMEN: Limited images of the upper abdomen are unremarkable. SOFT TISSUES AND BONES: No acute bone or soft tissue abnormality. IMPRESSION: 1. No evidence of pulmonary  embolus. 2. Small to moderate bilateral pleural effusions. 3. Coronary artery disease, aortic atherosclerosis. Electronically signed by: Franky Crease MD 07/17/2024 03:01 AM EST RP Workstation: [id number]   DG Chest Port 1 View Result Date: 07/17/2024 EXAM: 1 VIEW(S) XRAY OF THE CHEST 07/17/2024 12:47:00 AM COMPARISON: PA and lateral chest 02/03/2023. CLINICAL HISTORY: Dyspnea. FINDINGS: LINES, TUBES AND DEVICES: Overlying telemetry leads. LUNGS AND PLEURA: Increased vascular engorgement, flow cephalization, and development of diffuse interstitial edema. Small pleural effusions are developing. No acute airspace infiltrate is seen. No pneumothorax. Findings consistent with Congestive Heart Failure (CHF) or fluid overload. Follow up as indicated. HEART AND MEDIASTINUM: The cardiac size is borderline prominent. The mediastinum is normally outlined. BONES AND SOFT TISSUES: No acute osseous abnormality. IMPRESSION: 1. Findings consistent with congestive heart failure or fluid overload. 2. No acute airspace infiltrate. Small pleural effusions. 3. Borderline cardiomegaly. Electronically signed by: Francis Quam MD 07/17/2024 01:05 AM EST RP Workstation: [id number]    Microbiology: Results for orders placed or performed during the hospital encounter of 07/17/24  Resp panel by RT-PCR (RSV, Flu A&B, Covid) Anterior Nasal Swab     Status: None   Collection Time: 07/17/24  12:41 AM   Specimen: Anterior Nasal Swab  Result Value Ref Range Status   SARS Coronavirus 2 by RT PCR NEGATIVE NEGATIVE Final   Influenza A by PCR NEGATIVE NEGATIVE Final   Influenza B by PCR NEGATIVE NEGATIVE Final    Comment: (NOTE) The Xpert Xpress SARS-CoV-2/FLU/RSV plus assay is intended as an aid in the diagnosis of influenza from Nasopharyngeal swab specimens and should not be used as a sole basis for treatment. Nasal washings and aspirates are unacceptable for Xpert Xpress SARS-CoV-2/FLU/RSV testing.  Fact Sheet for Patients: bloggercourse.com  Fact Sheet for Healthcare Providers: seriousbroker.it  This test is not yet approved or cleared by the [place]  FDA and has been authorized for detection and/or diagnosis of SARS-CoV-2 by FDA under an Emergency Use Authorization (EUA). This EUA will remain in effect (meaning this test can be used) for the duration of the COVID-19 declaration under Section 564(b)(1) of the Act, 21 U.S.C. section 360bbb-3(b)(1), unless the authorization is terminated or revoked.     Resp Syncytial Virus by PCR NEGATIVE NEGATIVE Final    Comment: (NOTE) Fact Sheet for Patients: bloggercourse.com  Fact Sheet for Healthcare Providers: seriousbroker.it  This test is not yet approved or cleared by the [place]  FDA and has been authorized for detection and/or diagnosis of SARS-CoV-2 by FDA under an Emergency Use Authorization (EUA). This EUA will remain in effect (meaning this test can be used) for the duration of the COVID-19 declaration under Section 564(b)(1) of the Act, 21 U.S.C. section 360bbb-3(b)(1), unless the authorization is terminated or revoked.  Performed at [facility], [address], [place], [place] [address]   Blood culture (routine x 2)     Status: None   Collection Time: 07/17/24 12:42 AM   Specimen: BLOOD   Result Value Ref Range Status   Specimen Description BLOOD SITE NOT SPECIFIED  Final   Special Requests   Final    BOTTLES DRAWN AEROBIC AND ANAEROBIC Blood Culture adequate volume   Culture   Final    NO GROWTH 5 DAYS Performed at [facility], [address], [place], [place] [address]    Report Status 07/22/2024 FINAL  Final  Blood culture (routine x 2)     Status: None   Collection Time: 07/17/24  2:13 AM  Specimen: BLOOD RIGHT HAND  Result Value Ref Range Status   Specimen Description BLOOD RIGHT HAND  Final   Special Requests   Final    BOTTLES DRAWN AEROBIC ONLY Blood Culture results may not be optimal due to an inadequate volume of blood received in culture bottles   Culture   Final    NO GROWTH 5 DAYS Performed at [facility], [address] [place], [place] [address]    Report Status 07/22/2024 FINAL  Final  MRSA Next Gen by PCR, Nasal     Status: None   Collection Time: 07/17/24  1:04 PM   Specimen: Nasal Mucosa; Nasal Swab  Result Value Ref Range Status   MRSA by PCR Next Gen NOT DETECTED NOT DETECTED Final    Comment: (NOTE) The GeneXpert MRSA Assay (FDA approved for NASAL specimens only), is one component of a comprehensive MRSA colonization surveillance program. It is not intended to diagnose MRSA infection nor to guide or monitor treatment for MRSA infections. Test performance is not FDA approved in patients less than 22 years old. Performed at [facility], [address] [place], [place] [address]   Respiratory (~20 pathogens) panel by PCR     Status: None   Collection Time: 07/17/24  5:01 PM   Specimen: Nasopharyngeal Swab; Respiratory  Result Value Ref Range Status   Adenovirus NOT DETECTED NOT DETECTED Final   Coronavirus 229E NOT DETECTED NOT DETECTED Final    Comment: (NOTE) The Coronavirus on the Respiratory Panel, DOES NOT test for the novel  Coronavirus (2019 nCoV)    Coronavirus HKU1 NOT DETECTED NOT DETECTED Final    Coronavirus NL63 NOT DETECTED NOT DETECTED Final   Coronavirus OC43 NOT DETECTED NOT DETECTED Final   Metapneumovirus NOT DETECTED NOT DETECTED Final   Rhinovirus / Enterovirus NOT DETECTED NOT DETECTED Final   Influenza A NOT DETECTED NOT DETECTED Final   Influenza B NOT DETECTED NOT DETECTED Final   Parainfluenza Virus 1 NOT DETECTED NOT DETECTED Final   Parainfluenza Virus 2 NOT DETECTED NOT DETECTED Final   Parainfluenza Virus 3 NOT DETECTED NOT DETECTED Final   Parainfluenza Virus 4 NOT DETECTED NOT DETECTED Final   Respiratory Syncytial Virus NOT DETECTED NOT DETECTED Final   Bordetella pertussis NOT DETECTED NOT DETECTED Final   Bordetella Parapertussis NOT DETECTED NOT DETECTED Final   Chlamydophila pneumoniae NOT DETECTED NOT DETECTED Final   Mycoplasma pneumoniae NOT DETECTED NOT DETECTED Final    Comment: Performed at [facility], [address] [place], [place] [address]    Labs: CBC: Recent Labs  Lab 07/17/24 0112 07/17/24 0122 07/17/24 1419 07/18/24 0258 07/19/24 0305 07/20/24 0259  WBC 9.9  --   --  7.8 11.3* 11.5*  NEUTROABS 8.9*  --   --   --   --   --   HGB 14.7 16.3  16.3 15.0  15.0 12.7* 12.5* 12.2*  HCT 47.3 48.0  48.0 44.0  44.0 39.9 39.1 38.2*  MCV 86.5  --   --  83.8 83.5 84.3  PLT 202  --   --  133* 184 197   Basic Metabolic Panel: Recent Labs  Lab 07/18/24 0258 07/18/24 1206 07/19/24 0630 07/19/24 1215 07/20/24 0945 07/21/24 0258 07/22/24 0254  NA 131*   < > 133* 132* 134* 135 140  K 5.4*   < > 4.3 4.8 5.0 5.1 5.3*  CL 101   < > 104 100 105 107 107  CO2  19*   < > 22 19* 19* 22 22  GLUCOSE 468*   < > 228* 450* 180* 195* 114*  BUN 66*   < > 71* 76* 83* 92* 79*  CREATININE 2.72*   < > 2.43* 2.45* 2.37* 2.31* 1.83*  CALCIUM  7.6*   < > 7.9* 7.6* 8.0* 8.0* 8.3*  MG 1.7  --   --   --   --   --   --   PHOS 5.0*  --   --   --   --   --   --    < > = values in this interval not displayed.   Liver Function Tests: Recent Labs  Lab  07/17/24 0112 07/18/24 0258 07/19/24 0305  AST 24 16 27   ALT 18 16 13   ALKPHOS 65 58 55  BILITOT 5.4* 5.0* 3.1*  PROT 5.6* 5.1* 5.1*  ALBUMIN 2.6* 2.3* 2.3*   CBG: Recent Labs  Lab 07/21/24 1238 07/21/24 1619 07/21/24 2045 07/22/24 0648 07/22/24 1130  GLUCAP 186* 221* 154* 115* 132*    Discharge time spent: greater than 30 minutes.  Signed: Elidia Toribio Furnace, MD [facility] 07/22/2024

## 2024-07-22 NOTE — Telephone Encounter (Signed)
 Pharmacy Patient Advocate Encounter  Insurance verification completed.    The patient is insured through [employer]. Patient has Medicare and is not eligible for a copay card, but may be able to apply for patient assistance or Medicare RX Payment Plan (Patient Must reach out to their plan, if eligible for payment plan), if available.    Ran test claim for Eliquis  5mg  tablet and the current 30 day co-pay is $47.   This test claim was processed through [facility]- copay amounts may vary at other pharmacies due to [employer], or as the patient moves through the different stages of their insurance plan.

## 2024-07-22 NOTE — Progress Notes (Signed)
 Mobility Specialist Progress Note;   07/22/24 1056  Mobility  Activity Ambulated with assistance  Level of Assistance Standby assist, set-up cues, supervision of patient - no hands on  Assistive Device Front wheel walker  Distance Ambulated (ft) 120 ft  Activity Response Tolerated well  Mobility Referral Yes  Mobility visit 1 Mobility  Mobility Specialist Start Time (ACUTE ONLY) 1056  Mobility Specialist Stop Time (ACUTE ONLY) 1111  Mobility Specialist Time Calculation (min) (ACUTE ONLY) 15 min   Pt agreeable to mobility. Required SV for bed mobility and during ambulation. Ambulated on 6LO2, minor SPO2 desat to 86% however recovered within ~30 sec to 90% w/ PLB and standing. No c/o when asked. Pt deferred sitting in the chair at this time and was returned to bed. Left with all needs met, call bell in reach.   Lauraine Erm Mobility Specialist Please contact via SecureChat or [employer] [phone]

## 2024-07-22 NOTE — Plan of Care (Signed)
°  Problem: Education: Goal: Ability to describe self-care measures that may prevent or decrease complications (Diabetes Survival Skills Education) will improve 07/22/2024 1946 by Ann Nena HERO, RN Outcome: Completed/Met 07/22/2024 1946 by Ann Nena HERO, RN Outcome: Adequate for Discharge 07/22/2024 1945 by Ann Nena HERO, RN Outcome: Adequate for Discharge Goal: Individualized Educational Video(s) 07/22/2024 1946 by Ann Nena HERO, RN Outcome: Completed/Met 07/22/2024 1946 by Ann Nena HERO, RN Outcome: Adequate for Discharge 07/22/2024 1945 by Ann Nena HERO, RN Outcome: Adequate for Discharge   Problem: Coping: Goal: Ability to adjust to condition or change in health will improve 07/22/2024 1946 by Ann Nena HERO, RN Outcome: Completed/Met 07/22/2024 1946 by Ann Nena HERO, RN Outcome: Adequate for Discharge 07/22/2024 1945 by Ann Nena HERO, RN Outcome: Adequate for Discharge   Problem: Fluid Volume: Goal: Ability to maintain a balanced intake and output will improve 07/22/2024 1946 by Ann Nena HERO, RN Outcome: Completed/Met 07/22/2024 1946 by Ann Nena HERO, RN Outcome: Adequate for Discharge 07/22/2024 1945 by Ann Nena HERO, RN Outcome: Adequate for Discharge   Problem: Health Behavior/Discharge Planning: Goal: Ability to identify and utilize available resources and services will improve 07/22/2024 1946 by Ann Nena HERO, RN Outcome: Completed/Met 07/22/2024 1946 by Ann Nena HERO, RN Outcome: Adequate for Discharge 07/22/2024 1945 by Ann Nena HERO, RN Outcome: Adequate for Discharge Goal: Ability to manage health-related needs will improve 07/22/2024 1946 by Ann Nena HERO, RN Outcome: Adequate for Discharge 07/22/2024 1945 by Ann Nena HERO, RN Outcome: Adequate for Discharge

## 2024-07-22 NOTE — Plan of Care (Signed)
  Problem: Education: Goal: Ability to describe self-care measures that may prevent or decrease complications (Diabetes Survival Skills Education) will improve Outcome: Not Progressing Goal: Individualized Educational Video(s) Outcome: Not Progressing   Problem: Coping: Goal: Ability to adjust to condition or change in health will improve Outcome: Not Progressing   Problem: Fluid Volume: Goal: Ability to maintain a balanced intake and output will improve Outcome: Not Progressing   Problem: Health Behavior/Discharge Planning: Goal: Ability to identify and utilize available resources and services will improve Outcome: Not Progressing Goal: Ability to manage health-related needs will improve Outcome: Not Progressing   Problem: Nutritional: Goal: Maintenance of adequate nutrition will improve Outcome: Not Progressing Goal: Progress toward achieving an optimal weight will improve Outcome: Not Progressing   Problem: Skin Integrity: Goal: Risk for impaired skin integrity will decrease Outcome: Not Progressing   Problem: Tissue Perfusion: Goal: Adequacy of tissue perfusion will improve Outcome: Not Progressing

## 2024-07-23 ENCOUNTER — Inpatient Hospital Stay: Admitting: Hematology and Oncology

## 2024-07-23 ENCOUNTER — Inpatient Hospital Stay

## 2024-07-23 VITALS — BP 148/69 | HR 75 | Temp 97.1°F | Resp 15 | Wt 150.1 lb

## 2024-07-23 DIAGNOSIS — I82411 Acute embolism and thrombosis of right femoral vein: Secondary | ICD-10-CM

## 2024-07-23 DIAGNOSIS — Z7901 Long term (current) use of anticoagulants: Secondary | ICD-10-CM | POA: Insufficient documentation

## 2024-07-23 DIAGNOSIS — F17210 Nicotine dependence, cigarettes, uncomplicated: Secondary | ICD-10-CM | POA: Diagnosis not present

## 2024-07-23 LAB — CBC WITH DIFFERENTIAL (CANCER CENTER ONLY)
Abs Immature Granulocytes: 0.19 K/uL — ABNORMAL HIGH (ref 0.00–0.07)
Basophils Absolute: 0 K/uL (ref 0.0–0.1)
Basophils Relative: 0 %
Eosinophils Absolute: 0 K/uL (ref 0.0–0.5)
Eosinophils Relative: 0 %
HCT: 38 % — ABNORMAL LOW (ref 39.0–52.0)
Hemoglobin: 11.9 g/dL — ABNORMAL LOW (ref 13.0–17.0)
Immature Granulocytes: 2 %
Lymphocytes Relative: 1 %
Lymphs Abs: 0.1 K/uL — ABNORMAL LOW (ref 0.7–4.0)
MCH: 26.4 pg (ref 26.0–34.0)
MCHC: 31.3 g/dL (ref 30.0–36.0)
MCV: 84.3 fL (ref 80.0–100.0)
Monocytes Absolute: 0.6 K/uL (ref 0.1–1.0)
Monocytes Relative: 6 %
Neutro Abs: 8.3 K/uL — ABNORMAL HIGH (ref 1.7–7.7)
Neutrophils Relative %: 91 %
Platelet Count: 273 K/uL (ref 150–400)
RBC: 4.51 MIL/uL (ref 4.22–5.81)
RDW: 16.5 % — ABNORMAL HIGH (ref 11.5–15.5)
WBC Count: 9.2 K/uL (ref 4.0–10.5)
nRBC: 0.2 % (ref 0.0–0.2)

## 2024-07-23 LAB — CMP (CANCER CENTER ONLY)
ALT: 32 U/L (ref 0–44)
AST: 37 U/L (ref 15–41)
Albumin: 3.5 g/dL (ref 3.5–5.0)
Alkaline Phosphatase: 66 U/L (ref 38–126)
Anion gap: 10 (ref 5–15)
BUN: 54 mg/dL — ABNORMAL HIGH (ref 8–23)
CO2: 25 mmol/L (ref 22–32)
Calcium: 9 mg/dL (ref 8.9–10.3)
Chloride: 105 mmol/L (ref 98–111)
Creatinine: 1.48 mg/dL — ABNORMAL HIGH (ref 0.61–1.24)
GFR, Estimated: 53 mL/min — ABNORMAL LOW (ref >60)
Glucose, Bld: 148 mg/dL — ABNORMAL HIGH (ref 70–99)
Potassium: 5.3 mmol/L — ABNORMAL HIGH (ref 3.5–5.1)
Sodium: 140 mmol/L (ref 135–145)
Total Bilirubin: 3.1 mg/dL — ABNORMAL HIGH (ref 0.0–1.2)
Total Protein: 6.1 g/dL — ABNORMAL LOW (ref 6.5–8.1)

## 2024-07-23 NOTE — Progress Notes (Signed)
 [facility] [phone]   [phone]  INITIAL CONSULT NOTE  Patient Care Team: Lenon Nell SAILOR, FNP as PCP - General (Nurse Practitioner) Rayburn Pac, MD as Consulting Physician (Nephrology) Raford Riggs, MD as Attending Physician (Cardiology) Tommas Pears, MD as Consulting Physician (Endocrinology) [facility], [facility]  Hematological/Oncological History # Right Lower Extremity DVT  05/02/2024: RLE US  showed acute deep vein thrombosis involving the distal common femoral vein and right femoral vein proximal segment to the distal segment. Additionally an acute superficial vein thrombosis involving the right great saphenous vein in the proximal segment with extention on distally, age undetermined was noted.   07/23/2024: Establish care with Dr. Federico.   CHIEF COMPLAINTS/PURPOSE OF CONSULTATION:  Right Lower Extremity DVT    HISTORY OF PRESENTING ILLNESS:  Daniel Santos. 63 y.o. male with medical history significant for CKD, type 1 diabetes, hypertension, TIAs/stroke, and supplemental oxygen usage who presents for evaluation of an unprovoked right lower extremity DVT.  On review of the previous records Daniel Santos underwent a ultrasound of his right lower extremity on 05/02/2024.  It showed acute deep vein thrombosis involving the distal common femoral vein and right femoral vein proximal segment to the distal segment. Additionally an acute superficial vein thrombosis involving the right great saphenous vein in the proximal segment with extention on distally, age undetermined was noted.  He was subsequent started on Eliquis  therapy and referred to hematology for further evaluation and management.   On exam today Daniel Santos reports that he originally had a discolored knot on his right lower extremity.  He reports it was painful and could become painful for him to walk at 1 point.  He notes that eventually became unbearable.   He underwent an ultrasound which showed evidence of the DVT.  He is been taking Eliquis  therapy and tolerating well with no bleeding, bruising, or dark stools.  He reports that his leg has recovered and he is not having any swelling, discoloration, or pain.  He reports that he is not having any difficulty affording Eliquis  at this time.  He notes that he has not been prickly active after his kidney transplant.  He reports that it is quite taxing for him to do simple tasks such as taking a shower.  On further discussion he reports that his mother died due to bone cancer about 2025 years ago.  His father is 50 years old and healthy.  He had a paternal grandmother had a stroke.  He has 3 children living and 1 child who passed away from HIV.  He was a heavier smoker but stopped mostly in 2018.  He is still smoking some at this point.  He reports is not currently drinking alcohol and previously worked for [employer].  He otherwise denies any fevers, chills, sweats, nausea, vomiting or diarrhea.  Full 10 point ROS otherwise negative.  MEDICAL HISTORY:  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) 03/25/2015   History of non-insulin  dependent diabetes mellitus    Hypercholesteremia    Hypertension    Left arm weakness    Palpitations 12/14/2015   Stroke [facility])     SURGICAL HISTORY: Past  Surgical History:  Procedure Laterality Date   AV FISTULA PLACEMENT     BASCILIC VEIN TRANSPOSITION Left 10/26/2017   Procedure: BASILIC VEIN TRANSPOSITION FIRST STAGE LEFT;  Surgeon: Oris Krystal FALCON, MD;  Location: [facility] OR;  Service: Vascular;  Laterality: Left;   BASCILIC VEIN TRANSPOSITION Left 01/18/2018   Procedure: SECOND STAGEBASILIC VEIN  TRANSPOSITION LEFT ARM;  Surgeon: Oris Krystal FALCON, MD;  Location: [facility] OR;  Service: Vascular;  Laterality: Left;   COLONOSCOPY     DIRECT LARYNGOSCOPY N/A 09/24/2018   Procedure: DIRECT LARYNGOSCOPY WITH BIOPSY,BRONCHOSOPY;  Surgeon: Terri Alan PARAS, MD;  Location: [facility] OR;  Service: ENT;  Laterality: N/A;   DRAINAGE AND CLOSURE OF LYMPHOCELE Left 11/21/2017   Procedure: DRAINAGE AND CLOSURE OF LYMPHOCELE OF LEFT UPPER ARM;  Surgeon: Harvey Carlin BRAVO, MD;  Location: [facility] OR;  Service: Vascular;  Laterality: Left;   ESOPHAGOGASTRODUODENOSCOPY     LUMBAR SPINE SURGERY  2005, 2008, 2010   REPAIR OF FRACTURED PENIS  05/2000   with cystoscopy   RIGHT HEART CATH N/A 07/17/2024   Procedure: RIGHT HEART CATH;  Surgeon: Rolan Ezra RAMAN, MD;  Location: [facility] INVASIVE CV LAB;  Service: Cardiovascular;  Laterality: N/A;    SOCIAL HISTORY: Social History   Socioeconomic History   Marital status: Single    Spouse name: Not on file   Number of children: 3   Years of education: 12   Highest education level: Not on file  Occupational History   Not on file  Tobacco Use   Smoking status: Former    Current packs/day: 0.00    Average packs/day: 1 pack/day for 40.0 years (40.0 ttl pk-yrs)    Types: Cigarettes    Start date: 06/06/1977    Quit date: 06/06/2017    Years since quitting: 7.1   Smokeless tobacco: Never  Vaping Use   Vaping status: Never Used  Substance and Sexual Activity   Alcohol use: Not Currently    Comment: 2 X A YEAR   Drug use: No   Sexual activity: Not on file  Other Topics Concern   Not on file  Social History Narrative   Patient drinks about 2 cups of caffeine daily.   Patient is right handed.   Social Drivers of Health   Tobacco Use: Medium Risk (07/17/2024)   Patient History    Smoking Tobacco Use: Former    Smokeless Tobacco Use: Never    Passive Exposure: Not on Actuary Strain: Not on file  Food Insecurity: No Food Insecurity (07/19/2024)   Epic     Worried About Programme Researcher, Broadcasting/film/video in the Last Year: Never true    Ran Out of Food in the Last Year: Never true  Transportation Needs: No Transportation Needs (07/19/2024)   Epic    Lack of Transportation (Medical): No    Lack of Transportation (Non-Medical): No  Physical Activity: Not on file  Stress: Not on file  Social Connections: Not on file  Intimate Partner Violence: Unknown (07/19/2024)   Epic    Fear of Current or Ex-Partner: Not on file    Emotionally Abused: No    Physically Abused: No    Sexually Abused: No  Depression (PHQ2-9): Not on file  Alcohol Screen: Not on file  Housing: Low Risk (07/19/2024)   Epic    Unable to Pay for Housing in the Last Year: No    Number of Times Moved in the Last Year: 0    Homeless in the  Last Year: No  Utilities: Not At Risk (07/19/2024)   Epic    Threatened with loss of utilities: No  Health Literacy: Not on file    FAMILY HISTORY: Family History  Problem Relation Age of Onset   Cancer Mother    Leukemia Paternal Uncle    Stroke Paternal Grandmother    Diabetes Paternal Grandmother    Breast cancer Paternal Grandmother    Kidney disease Father    Diabetes Paternal Aunt        x2   Breast cancer Maternal Grandmother    Colon cancer Neg Hx    Esophageal cancer Neg Hx    Stomach cancer Neg Hx    Rectal cancer Neg Hx    Colon polyps Neg Hx     ALLERGIES:  is allergic to doxycycline .  MEDICATIONS:  Current Outpatient Medications  Medication Sig Dispense Refill   acetaminophen  (TYLENOL ) 500 MG tablet Take 1,000 mg by mouth every 6 (six) hours as needed (pain).     apixaban  (ELIQUIS ) 5 MG TABS tablet Take 1 tablet (5 mg total) by mouth 2 (two) times daily. Start taking after completion of starter pack. 60 tablet 1   aspirin  EC 81 MG tablet Take 1 tablet (81 mg total) by mouth daily. Swallow whole.     atorvastatin  (LIPITOR) 40 MG tablet Take 1 tablet (40 mg total) every evening by mouth. (Patient taking differently: Take  40 mg by mouth daily.) 30 tablet 2   azaTHIOprine  (IMURAN ) 50 MG tablet Take 2 tablets (100 mg total) by mouth daily. 60 tablet 0   BACTRIM 400-80 MG tablet Take 1 tablet 3 times a week by oral route as directed for 90 days, for M, W, F.     BUTRANS  20 MCG/HR PTWK patch Place 20 mcg onto the skin every Monday.      carvedilol  (COREG ) 12.5 MG tablet Take 1 tablet (12.5 mg total) by mouth 2 (two) times daily with a meal. 90 tablet 2   cycloSPORINE  modified (NEORAL ) 100 MG capsule Take 1 capsule (100 mg total) by mouth 2 (two) times daily. 60 capsule 0   fluticasone  furoate-vilanterol (BREO ELLIPTA ) 200-25 MCG/ACT AEPB Inhale 1 puff into the lungs daily. 60 each 0   furosemide  (LASIX ) 40 MG tablet Take 1 tablet (40 mg total) by mouth daily. 30 tablet 0   insulin  lispro (HUMALOG) 100 UNIT/ML injection See admin instructions. Uses via insulin  pump per bolus rate     LINZESS  145 MCG CAPS capsule Take 145 mcg by mouth daily as needed (for constipation).      predniSONE  (DELTASONE ) 10 MG tablet Take 1 tablet (10 mg total) by mouth daily. 30 tablet 0   tamsulosin  (FLOMAX ) 0.4 MG CAPS capsule Take 0.4 mg by mouth 2 (two) times daily.     No current facility-administered medications for this visit.    REVIEW OF SYSTEMS:   Constitutional: ( - ) fevers, ( - )  chills , ( - ) night sweats Eyes: ( - ) blurriness of vision, ( - ) double vision, ( - ) watery eyes Ears, nose, mouth, throat, and face: ( - ) mucositis, ( - ) sore throat Respiratory: ( - ) cough, ( - ) dyspnea, ( - ) wheezes Cardiovascular: ( - ) palpitation, ( - ) chest discomfort, ( - ) lower extremity swelling Gastrointestinal:  ( - ) nausea, ( - ) heartburn, ( - ) change in bowel habits Skin: ( - ) abnormal skin rashes Lymphatics: ( - )  new lymphadenopathy, ( - ) easy bruising Neurological: ( - ) numbness, ( - ) tingling, ( - ) new weaknesses Behavioral/Psych: ( - ) mood change, ( - ) new changes  All other systems were reviewed with the  patient and are negative.  PHYSICAL EXAMINATION:  There were no vitals filed for this visit. There were no vitals filed for this visit.  GENERAL: well appearing middle-age African-American male in NAD  SKIN: skin color, texture, turgor are normal, no rashes or significant lesions EYES: conjunctiva are pink and non-injected, sclera clear LUNGS: clear to auscultation and percussion with normal breathing effort HEART: regular rate & rhythm and no murmurs and no lower extremity edema Musculoskeletal: no cyanosis of digits and no clubbing  PSYCH: alert & oriented x 3, fluent speech NEURO: no focal motor/sensory deficits  LABORATORY DATA:  I have reviewed the data as listed    Latest Ref Rng & Units 07/20/2024    2:59 AM 07/19/2024    3:05 AM 07/18/2024    2:58 AM  CBC  WBC 4.0 - 10.5 K/uL 11.5  11.3  7.8   Hemoglobin 13.0 - 17.0 g/dL 87.7  87.4  87.2   Hematocrit 39.0 - 52.0 % 38.2  39.1  39.9   Platelets 150 - 400 K/uL 197  184  133        Latest Ref Rng & Units 07/22/2024    2:13 PM 07/22/2024    2:54 AM 07/21/2024    2:58 AM  CMP  Glucose 70 - 99 mg/dL 844  885  804   BUN 8 - 23 mg/dL 70  79  92   Creatinine 0.61 - 1.24 mg/dL 8.43  8.16  7.68   Sodium 135 - 145 mmol/L 140  140  135   Potassium 3.5 - 5.1 mmol/L 4.9  5.3  5.1   Chloride 98 - 111 mmol/L 105  107  107   CO2 22 - 32 mmol/L 25  22  22    Calcium  8.9 - 10.3 mg/dL 8.7  8.3  8.0      ASSESSMENT & PLAN Daniel Santos. 63 y.o. male with medical history significant for CKD, type 1 diabetes, hypertension, TIAs/stroke, and supplemental oxygen usage who presents for evaluation of an unprovoked right lower extremity DVT.  After review of the labs, review of the records, and discussion with the patient the patients findings are most consistent with a unprvoked RLE DVT.   A provoked venous thromboembolism (VTE) is one that has a clear inciting factor or event. Provoking factors include prolonged  travel/immobility, surgery (particular abdominal or orthropedic), trauma,  and pregnancy/ estrogen containing birth control. After a detailed history and review of the records there is no clear provoking factor for this patients VTE.  Patients with unprovoked VTEs have up to 25% recurrence after 5 years and 36% at 10 years, with 4% of these clots being fatal (BMJ?2019;366:l4363). Therefore the formal recommendation for unprovoked VTEs is lifelong anticoagulation, as the cause may not be transient or reversible. We recommend 6 months or full strength anticoagulation with a re-evaluation after that time.  The patients will then have a choice of maintenance dose DOAC (preferred, recommended), 81mg  ASA PO daily (non-preferred), or no further anticoagulation (not recommended).    #Unprovoked DVT of RLE   --findings at this time are consistent with a unprovoked VTE  --will order baseline CMP and CBC to assure labs are adequate for DOAC therapy  --rule out APS with anticardiolipin and  anti beta2 glycoprotein antibodies.  Lupus anticoagulant panel would be altered by presence of blood thinner, will hold on this testing.   --recommend the patient continue eliquis  5mg  BID PO. Do no recommend dropping to maintenance dose due to history of TIAs in 2018 (additionally he developed clot on Plavix /ASA) --patient denies any bleeding, bruising, or dark stools on this medication. It is well tolerated. No difficulties accessing/affording the medication  --RTC in 6 months time with strict return precautions for overt signs of bleeding.     No orders of the defined types were placed in this encounter.   All questions were answered. The patient knows to call the clinic with any problems, questions or concerns.  A total of more than 60 minutes were spent on this encounter with face-to-face time and non-face-to-face time, including preparing to see the patient, ordering tests and/or medications, counseling the patient  and coordination of care as outlined above.   Daniel IVAR Kidney, MD Department of Hematology/Oncology [facility] Phone: [phone] Pager: [phone] Email: [email or web address]  07/23/2024 1:25 PM

## 2024-07-23 NOTE — Progress Notes (Unsigned)
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  Daniel Santos. is a 63 year old male presenting for follow up. He has persistent shortness of breath that has improved since leaving the hospital but not resolved. He remains oxygen dependent at 5 L/min continuously. Attempts to decrease to 3 L/min worsen symptoms, and he sometimes increases flow with exertion to prevent dyspnea. He was recently evaluated by hematology for a blood clot in his leg and has not yet seen a pulmonologist following discharge. He takes Lasix , which increases urine output but causes mild dehydration. He denies any LE edema, orthopnea, or PND.  He was diagnosed with emphysema in 2018. He quit smoking after a mini-stroke in 2017 or 2018.     ROS: Otherwise negative  Physical Exam VS:  BP (!) 120/56 (BP Location: Right Arm, Patient Position: Sitting, Cuff Size: Normal)    Pulse 75   Ht 5' 9 (1.753 m)   Wt 148 lb (67.1 kg)   SpO2 96%   BMI 21.86 kg/m  , BMI Body mass index is 21.86 kg/m. GEN: Well nourished, well developed, in no acute distress HEENT: normal Neck: no JVD, carotid bruits, or masses Cardiac: RRR w/ HSM loudest in RLSB; no murmurs, rubs, or gallops,no edema  Respiratory:  CTAB bilaterally, normal work of breathing GI: soft, nontender, nondistended, + BS Extremities: No LE edema Skin: warm and dry, no rash Neuro:  Strength and sensation are intact  Recent Labs: Reviewed  ASSESSMENT AND PLAN Daniel Santos. is a 63 y.o. male who presents for follow up.     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 Requires supplemental oxygen, currently using 5 L/min. - Ordered pulmonary function tests to assess COPD severity. - Referred to pulmonologist for further evaluation and management. - Continue supplemental oxygen therapy. - Continue Lasix  for fluid management. - Follow up in 6 months.   Moderate tricuspid regurgitation Likely secondary to pulmonary hypertension, contributing to fluid retention and requiring diuretic therapy. - Continue Lasix  for fluid management.      Signed, Joelle VEAR Ren Donley, MD  07/24/2024 1:12 PM     [facility]

## 2024-07-24 ENCOUNTER — Telehealth: Payer: Self-pay | Admitting: Hematology and Oncology

## 2024-07-24 ENCOUNTER — Ambulatory Visit: Attending: Cardiovascular Disease

## 2024-07-24 VITALS — BP 120/56 | HR 75 | Ht 69.0 in | Wt 148.0 lb

## 2024-07-24 DIAGNOSIS — Z87891 Personal history of nicotine dependence: Secondary | ICD-10-CM | POA: Diagnosis not present

## 2024-07-24 DIAGNOSIS — M7989 Other specified soft tissue disorders: Secondary | ICD-10-CM | POA: Diagnosis not present

## 2024-07-24 DIAGNOSIS — R0602 Shortness of breath: Secondary | ICD-10-CM | POA: Diagnosis not present

## 2024-07-24 DIAGNOSIS — J449 Chronic obstructive pulmonary disease, unspecified: Secondary | ICD-10-CM

## 2024-07-24 DIAGNOSIS — I2510 Atherosclerotic heart disease of native coronary artery without angina pectoris: Secondary | ICD-10-CM | POA: Diagnosis not present

## 2024-07-24 DIAGNOSIS — I071 Rheumatic tricuspid insufficiency: Secondary | ICD-10-CM | POA: Diagnosis not present

## 2024-07-24 DIAGNOSIS — I272 Pulmonary hypertension, unspecified: Secondary | ICD-10-CM

## 2024-07-24 DIAGNOSIS — I639 Cerebral infarction, unspecified: Secondary | ICD-10-CM | POA: Diagnosis not present

## 2024-07-24 NOTE — Telephone Encounter (Signed)
 I spoke with patient and he is aware of lab and MD visit on 01/22/2025.

## 2024-07-24 NOTE — Patient Instructions (Signed)
° °  Testing/Procedures:  Your physician has recommended that you have a pulmonary function test. Pulmonary Function Tests are a group of tests that measure how well air moves in and out of your lungs. [facility]  Follow-Up: At [facility], you and your health needs are our priority.  As part of our continuing mission to provide you with exceptional heart care, our providers are all part of one team.  This team includes your primary Cardiologist (physician) and Advanced Practice Providers or APPs (Physician Assistants and Nurse Practitioners) who all work together to provide you with the care you need, when you need it.  Your next appointment:   6 month(s)  Provider:   One of our Advanced Practice Providers (APPs): Morse Clause, PA-C  Lamarr Satterfield, NP Miriam Shams, NP  Olivia Pavy, PA-C Josefa Beauvais, NP  Leontine Salen, PA-C Orren Fabry, PA-C  [place], PA-C Ernest Dick, NP  Damien Braver, NP Jon Hails, PA-C  Waddell Donath, PA-C    Dayna Dunn, PA-C  Scott Weaver, PA-C Lum Louis, NP Katlyn West, NP Callie Goodrich, PA-C  Xika Zhao, NP Sheng Haley, PA-C    Kathleen Johnson, PA-C

## 2024-07-25 ENCOUNTER — Ambulatory Visit: Payer: Self-pay

## 2024-07-25 LAB — LIPID PANEL
Chol/HDL Ratio: 3.5 ratio (ref 0.0–5.0)
Cholesterol, Total: 122 mg/dL (ref 100–199)
HDL: 35 mg/dL — ABNORMAL LOW
LDL Chol Calc (NIH): 60 mg/dL (ref 0–99)
Triglycerides: 156 mg/dL — ABNORMAL HIGH (ref 0–149)
VLDL Cholesterol Cal: 27 mg/dL (ref 5–40)

## 2024-07-26 LAB — BETA-2-GLYCOPROTEIN I ABS, IGG/M/A
Beta-2 Glyco I IgG: <9 GPI IgG units (ref 0–20)
Beta-2-Glycoprotein I IgA: <9 GPI IgA units (ref 0–25)
Beta-2-Glycoprotein I IgM: <9 GPI IgM units (ref 0–32)

## 2024-07-28 LAB — CARDIOLIPIN ANTIBODIES, IGG, IGM, IGA
Anticardiolipin IgA: <9 U/mL (ref 0–11)
Anticardiolipin IgG: <9 GPL U/mL (ref 0–14)
Anticardiolipin IgM: <9 [MPL'U]/mL (ref 0–12)

## 2024-08-04 ENCOUNTER — Ambulatory Visit (HOSPITAL_COMMUNITY): Payer: Self-pay | Admitting: Family Medicine

## 2024-08-04 ENCOUNTER — Ambulatory Visit (HOSPITAL_COMMUNITY)
Admit: 2024-08-04 | Discharge: 2024-08-04 | Disposition: A | Discharge status: Home / Self Care | Attending: Family Medicine | Admitting: Family Medicine

## 2024-08-04 ENCOUNTER — Encounter (HOSPITAL_COMMUNITY): Payer: Self-pay

## 2024-08-04 VITALS — BP 118/60 | HR 70 | Wt 147.0 lb

## 2024-08-04 DIAGNOSIS — Q2112 Patent foramen ovale: Secondary | ICD-10-CM | POA: Diagnosis not present

## 2024-08-04 DIAGNOSIS — Z86718 Personal history of other venous thrombosis and embolism: Secondary | ICD-10-CM | POA: Diagnosis not present

## 2024-08-04 DIAGNOSIS — I2510 Atherosclerotic heart disease of native coronary artery without angina pectoris: Secondary | ICD-10-CM | POA: Insufficient documentation

## 2024-08-04 DIAGNOSIS — N183 Chronic kidney disease, stage 3 unspecified: Secondary | ICD-10-CM | POA: Diagnosis not present

## 2024-08-04 DIAGNOSIS — Z940 Kidney transplant status: Secondary | ICD-10-CM | POA: Diagnosis not present

## 2024-08-04 DIAGNOSIS — Z79899 Other long term (current) drug therapy: Secondary | ICD-10-CM | POA: Insufficient documentation

## 2024-08-04 DIAGNOSIS — J9611 Chronic respiratory failure with hypoxia: Secondary | ICD-10-CM | POA: Insufficient documentation

## 2024-08-04 DIAGNOSIS — Z7952 Long term (current) use of systemic steroids: Secondary | ICD-10-CM | POA: Insufficient documentation

## 2024-08-04 DIAGNOSIS — Z9641 Presence of insulin pump (external) (internal): Secondary | ICD-10-CM | POA: Insufficient documentation

## 2024-08-04 DIAGNOSIS — Z9981 Dependence on supplemental oxygen: Secondary | ICD-10-CM | POA: Diagnosis not present

## 2024-08-04 DIAGNOSIS — I5032 Chronic diastolic (congestive) heart failure: Secondary | ICD-10-CM | POA: Insufficient documentation

## 2024-08-04 DIAGNOSIS — Z87891 Personal history of nicotine dependence: Secondary | ICD-10-CM | POA: Diagnosis not present

## 2024-08-04 DIAGNOSIS — I130 Hypertensive heart and chronic kidney disease with heart failure and stage 1 through stage 4 chronic kidney disease, or unspecified chronic kidney disease: Secondary | ICD-10-CM | POA: Diagnosis not present

## 2024-08-04 DIAGNOSIS — I82419 Acute embolism and thrombosis of unspecified femoral vein: Secondary | ICD-10-CM | POA: Insufficient documentation

## 2024-08-04 DIAGNOSIS — Z9483 Pancreas transplant status: Secondary | ICD-10-CM | POA: Diagnosis not present

## 2024-08-04 DIAGNOSIS — Z79624 Long term (current) use of inhibitors of nucleotide synthesis: Secondary | ICD-10-CM | POA: Diagnosis not present

## 2024-08-04 DIAGNOSIS — N1832 Chronic kidney disease, stage 3b: Secondary | ICD-10-CM | POA: Diagnosis not present

## 2024-08-04 DIAGNOSIS — I2781 Cor pulmonale (chronic): Secondary | ICD-10-CM | POA: Diagnosis not present

## 2024-08-04 DIAGNOSIS — I2723 Pulmonary hypertension due to lung diseases and hypoxia: Secondary | ICD-10-CM | POA: Diagnosis not present

## 2024-08-04 DIAGNOSIS — E1022 Type 1 diabetes mellitus with diabetic chronic kidney disease: Secondary | ICD-10-CM | POA: Insufficient documentation

## 2024-08-04 DIAGNOSIS — Z79621 Long term (current) use of calcineurin inhibitor: Secondary | ICD-10-CM | POA: Insufficient documentation

## 2024-08-04 DIAGNOSIS — I69359 Hemiplegia and hemiparesis following cerebral infarction affecting unspecified side: Secondary | ICD-10-CM | POA: Insufficient documentation

## 2024-08-04 DIAGNOSIS — Z7951 Long term (current) use of inhaled steroids: Secondary | ICD-10-CM | POA: Insufficient documentation

## 2024-08-04 DIAGNOSIS — J439 Emphysema, unspecified: Secondary | ICD-10-CM | POA: Insufficient documentation

## 2024-08-04 DIAGNOSIS — Z7982 Long term (current) use of aspirin: Secondary | ICD-10-CM | POA: Insufficient documentation

## 2024-08-04 DIAGNOSIS — Z794 Long term (current) use of insulin: Secondary | ICD-10-CM | POA: Insufficient documentation

## 2024-08-04 DIAGNOSIS — Z7901 Long term (current) use of anticoagulants: Secondary | ICD-10-CM | POA: Insufficient documentation

## 2024-08-04 LAB — CBC
HCT: 37.1 % — ABNORMAL LOW (ref 39.0–52.0)
Hemoglobin: 11.2 g/dL — ABNORMAL LOW (ref 13.0–17.0)
MCH: 26.5 pg (ref 26.0–34.0)
MCHC: 30.2 g/dL (ref 30.0–36.0)
MCV: 87.9 fL (ref 80.0–100.0)
Platelets: 252 K/uL (ref 150–400)
RBC: 4.22 MIL/uL (ref 4.22–5.81)
RDW: 17.2 % — ABNORMAL HIGH (ref 11.5–15.5)
WBC: 7.6 K/uL (ref 4.0–10.5)
nRBC: 0.4 % — ABNORMAL HIGH (ref 0.0–0.2)

## 2024-08-04 LAB — BASIC METABOLIC PANEL WITH GFR
Anion gap: 8 (ref 5–15)
BUN: 30 mg/dL — ABNORMAL HIGH (ref 8–23)
CO2: 28 mmol/L (ref 22–32)
Calcium: 8.6 mg/dL — ABNORMAL LOW (ref 8.9–10.3)
Chloride: 105 mmol/L (ref 98–111)
Creatinine, Ser: 1.63 mg/dL — ABNORMAL HIGH (ref 0.61–1.24)
GFR, Estimated: 47 mL/min — ABNORMAL LOW
Glucose, Bld: 167 mg/dL — ABNORMAL HIGH (ref 70–99)
Potassium: 5.9 mmol/L — ABNORMAL HIGH (ref 3.5–5.1)
Sodium: 140 mmol/L (ref 135–145)

## 2024-08-04 NOTE — Patient Instructions (Signed)
 There has been no changes to your medications.  Labs done today, your results will be available in MyChart, we will contact you for abnormal readings.  Your physician recommends that you schedule a follow-up appointment in: 4 months ( April 206) ** PLEASE CALL THE OFFICE IN [place] TO ARRANGE YOUR FOLLOW UP APPOINTMENT.**  If you have any questions or concerns before your next appointment please send us  a message through [facility] or call our office at [phone].    TO LEAVE A MESSAGE FOR THE NURSE SELECT OPTION 2, PLEASE LEAVE A MESSAGE INCLUDING: YOUR NAME DATE OF BIRTH CALL BACK NUMBER REASON FOR CALL**this is important as we prioritize the call backs  YOU WILL RECEIVE A CALL BACK THE SAME DAY AS LONG AS YOU CALL BEFORE 4:00 PM  At the [facility], you and your health needs are our priority. As part of our continuing mission to provide you with exceptional heart care, we have created designated Provider Care Teams. These Care Teams include your primary Cardiologist (physician) and Advanced Practice Providers (APPs- Physician Assistants and Nurse Practitioners) who all work together to provide you with the care you need, when you need it.   You may see any of the following providers on your designated Care Team at your next follow up: Dr Toribio Fuel Dr Ezra Shuck Dr. Morene Brownie Greig Mosses, NP Caffie Shed, [place] [facility] [place], [place] Beckey Coe, NP Jordan Lee, NP Ellouise Class, NP Tinnie Redman, PharmD Jaun Bash, PharmD   Please be sure to bring in all your medications bottles to every appointment.    Thank you for choosing  [facility]

## 2024-08-04 NOTE — Telephone Encounter (Signed)
 Medication Samples have been provided to the patient.  Drug name: Lokelma        Strength: 10g        Qty: 1 packet  LOT: [id number]  Exp.Date: 01/04/25  Dosing instructions: Take 1 packet as directed by HF Clinic  The patient has been instructed regarding the correct time, dose, and frequency of taking this medication, including desired effects and most common side effects.   Daniel Santos 5:18 PM 08/04/2024

## 2024-08-04 NOTE — Progress Notes (Signed)
 "  [facility] CONSULT NOTE   Primary Care: Lenon Nell SAILOR, FNP HF Cardiologist: Dr. Zenaida  HPI: 63 y.o. male with history of pulmonary hypertension, CKD IV d/t IDDM type 1, HTN, kidney/pancreas transplant in 2021, multiple CVAs, DVT, CAD, 80 pack yr smoking history (previously worked in a cigarette factory).   Echo 4/21 ([facility]): mild pHTN with mild RV dysfunction, RVSP 49   Found to have DVT in 9/25 to the RLE. He was referred to DVT Clinic and was started on Eliquis .   Echo 10/25 showed EF 55-60%, RV normal, PASP 60.8 mmHg, moderate TR   Admitted 12/25 with acute hypoxic respiratory failure and pulmonary hypertension. Diuresed with IV lasix  and underwent RHC showing normal RA and PCWP, moderate PAH with elevated PVR, preserved CO and PAPi. 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 Hospitalization complicated by AKI, received gentle IVF. He was discharged home on new oxygen, weight 148 lbs.  Today he returns for HF follow up with his wife. Overall feeling fine. He is not SOB walking on flat ground, has difficulty walking up steps. Remains on 4 L oxygen. Denies palpitations, abnormal bleeding, CP, dizziness, edema, or PND/Orthopnea. Appetite ok. Weight at home 145 pounds. Taking all medications.    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) 03/25/2015   History of non-insulin  dependent diabetes mellitus    Hypercholesteremia    Hypertension    Left arm weakness    Palpitations 12/14/2015   Stroke [facility])     Current Outpatient Medications  Medication Sig Dispense Refill   acetaminophen  (TYLENOL ) 500 MG tablet Take 1,000 mg by mouth every 6 (six) hours as needed (pain).     apixaban  (ELIQUIS ) 5 MG TABS tablet  Take 1 tablet (5 mg total) by mouth 2 (two) times daily. Start taking after completion of starter pack. 60 tablet 1   aspirin  EC 81 MG tablet Take 1 tablet (81 mg total) by mouth daily. Swallow whole.     atorvastatin  (LIPITOR) 40 MG tablet Take 1 tablet (40 mg total) every evening by mouth. 30 tablet 2   azaTHIOprine  (IMURAN ) 50 MG tablet Take 2 tablets (100 mg total) by mouth daily. 60 tablet 0   BACTRIM 400-80 MG tablet Take 1 tablet 3 times a week by oral route as directed for 90 days, for M, W, F.     BUTRANS  20 MCG/HR PTWK patch Place 20 mcg onto the skin every Monday.      carvedilol  (COREG ) 12.5 MG tablet Take 1 tablet (12.5 mg total) by mouth 2 (two) times daily with a meal. 90 tablet 2   cycloSPORINE  modified (NEORAL ) 100 MG capsule Take 1 capsule (100 mg total) by mouth 2 (two) times daily. 60 capsule 0   fluticasone  furoate-vilanterol (BREO ELLIPTA ) 200-25 MCG/ACT AEPB Inhale 1 puff into the lungs daily. 60 each 0   furosemide  (LASIX ) 40 MG tablet Take 1 tablet (40 mg total) by mouth daily. 30 tablet 0   insulin  lispro (HUMALOG) 100 UNIT/ML injection See admin instructions. Uses via insulin  pump per bolus rate     LINZESS  145 MCG CAPS capsule Take 145 mcg by mouth daily as needed (for  constipation).      predniSONE  (DELTASONE ) 10 MG tablet Take 1 tablet (10 mg total) by mouth daily. 30 tablet 0   tamsulosin  (FLOMAX ) 0.4 MG CAPS capsule Take 0.4 mg by mouth 2 (two) times daily.     No current facility-administered medications for this encounter.   Allergies[1]  Social History   Socioeconomic History   Marital status: Single    Spouse name: Not on file   Number of children: 3   Years of education: 12   Highest education level: Not on file  Occupational History   Not on file  Tobacco Use   Smoking status: Former    Current packs/day: 0.00    Average packs/day: 1 pack/day for 40.0 years (40.0 ttl pk-yrs)    Types: Cigarettes    Start date: 06/06/1977    Quit date:  06/06/2017    Years since quitting: 7.1   Smokeless tobacco: Never  Vaping Use   Vaping status: Never Used  Substance and Sexual Activity   Alcohol use: Not Currently    Comment: 2 X A YEAR   Drug use: No   Sexual activity: Not on file  Other Topics Concern   Not on file  Social History Narrative   Patient drinks about 2 cups of caffeine daily.   Patient is right handed.   Social Drivers of Health   Tobacco Use: Medium Risk (08/04/2024)   Patient History    Smoking Tobacco Use: Former    Smokeless Tobacco Use: Never    Passive Exposure: Not on [place] Strain: Not on file  Food Insecurity: No Food Insecurity (07/23/2024)   Epic    Worried About Programme Researcher, Broadcasting/film/video in the Last Year: Never true    Ran Out of Food in the Last Year: Never true  Transportation Needs: Unknown (07/23/2024)   Epic    Lack of Transportation (Medical): No    Lack of Transportation (Non-Medical): Not on file  Physical Activity: Not on file  Stress: Not on file  Social Connections: Not on file  Intimate Partner Violence: Not At Risk (07/23/2024)   Epic    Fear of Current or Ex-Partner: No    Emotionally Abused: No    Physically Abused: No    Sexually Abused: No  Depression (PHQ2-9): Low Risk (07/23/2024)   Depression (PHQ2-9)    PHQ-2 Score: 0  Alcohol Screen: Not on file  Housing: Unknown (07/23/2024)   Epic    Unable to Pay for Housing in the Last Year: No    Number of Times Moved in the Last Year: Not on file    Homeless in the Last Year: No  Utilities: Not At Risk (07/19/2024)   Epic    Threatened with loss of utilities: No  Health Literacy: Not on file   Family History  Problem Relation Age of Onset   Cancer Mother    Leukemia Paternal Uncle    Stroke Paternal Grandmother    Diabetes Paternal Grandmother    Breast cancer Paternal Grandmother    Kidney disease Father    Diabetes Paternal Aunt        x2   Breast cancer Maternal Grandmother    Colon cancer Neg  Hx    Esophageal cancer Neg Hx    Stomach cancer Neg Hx    Rectal cancer Neg Hx    Colon polyps Neg Hx     Wt Readings from Last 3 Encounters:  08/04/24 66.7 kg (147 lb)  07/24/24 67.1 kg (148 lb)  07/23/24 68.1 kg (150 lb 1.6 oz)   BP 118/60   Pulse 70   Wt 66.7 kg (147 lb)   SpO2 100% Comment: 4 l n/c  BMI 21.71 kg/m   PHYSICAL EXAM: General:  NAD. No resp difficulty, arrived in [facility] on oxygen HEENT: Normal Neck: Supple. No JVD. Cor: Regular rate & rhythm. No rubs, gallops or murmurs. Lungs: Clear Abdomen: Soft, nontender, nondistended.  Extremities: No cyanosis, clubbing, rash, edema Neuro: Alert & oriented x 3, moves all 4 extremities w/o difficulty. Affect pleasant.  ECG (personally reviewed): NSR 70 bpm  ASSESSMENT & PLAN: Pulmonary Hypertension with Cor Pulmonale/Chronic Hypoxic Respiratory Failure:  - CTA negative for PE, has no history of prior PEs. He does have emphysema, long history of smoking. - Echo 4/21: mild pHTN with mild RV dysfunction, RVSP 49 - RHC 5/21: RA 7, PA 34/8, PCW 13, CO/CI 4.67/2.69 - Echo 10/25: EF 55-60% severely elevated PA pressures, mod TR, RVSP 61 - RHC 07/17/24: Normal RA pressure and PCWP.  Moderate PAH with elevated PVR. Preserved CO and PAPi.  - Likely WHO Group 3 in setting of COPD - Continue oxygen; he has been referred to Pulmonary.   - NYHA II, volume ok today - Continue Lasix  40 mg daily. - No SGLT2i with recent DKA and insulin  pump - Labs today.   CAD - Coronary CTA 9/25: calcium  score 315 with mild nonobstructive CAD - No chest pain - He is on ASA - Continue atorva 40 mg daily - Lipids followed by Dr. Azobou Tonleu   CKD 3b - h/o kidney tx - avoid hypotension - BMET today   DVT - US  9/25: acute deep DVT in common femoral and superficial DVT in the great saphenous - Follows with Heme/Onc - Continue Eliquis  + ASA - CBC today.   PFO  - noted on CTA  H/o Dual Organ Transplant: pancreas and kidney in 2021 - He  is on azathioprine , cyclosporine  and prednisone   Follow up in 4 months with Dr. Zenaida Harlene Gainer, FNP-BC 08/04/2024      [1]  Allergies Allergen Reactions   Doxycycline  Nausea Only and Other (See Comments)    GI upset, kidney function   "

## 2024-08-05 ENCOUNTER — Other Ambulatory Visit: Payer: Self-pay | Admitting: Vascular Surgery

## 2024-08-05 ENCOUNTER — Ambulatory Visit (HOSPITAL_COMMUNITY): Payer: Self-pay | Admitting: Family Medicine

## 2024-08-05 ENCOUNTER — Other Ambulatory Visit: Payer: Self-pay

## 2024-08-05 ENCOUNTER — Ambulatory Visit (HOSPITAL_COMMUNITY)
Admission: RE | Admit: 2024-08-05 | Discharge: 2024-08-05 | Disposition: A | Discharge status: Home / Self Care | Source: Ambulatory Visit | Attending: Cardiology | Admitting: Cardiology

## 2024-08-05 DIAGNOSIS — I82411 Acute embolism and thrombosis of right femoral vein: Secondary | ICD-10-CM

## 2024-08-05 DIAGNOSIS — I5032 Chronic diastolic (congestive) heart failure: Secondary | ICD-10-CM | POA: Diagnosis present

## 2024-08-05 LAB — BASIC METABOLIC PANEL WITH GFR
Anion gap: 8 (ref 5–15)
BUN: 31 mg/dL — ABNORMAL HIGH (ref 8–23)
CO2: 29 mmol/L (ref 22–32)
Calcium: 8.6 mg/dL — ABNORMAL LOW (ref 8.9–10.3)
Chloride: 106 mmol/L (ref 98–111)
Creatinine, Ser: 1.61 mg/dL — ABNORMAL HIGH (ref 0.61–1.24)
GFR, Estimated: 47 mL/min — ABNORMAL LOW
Glucose, Bld: 78 mg/dL (ref 70–99)
Potassium: 4.4 mmol/L (ref 3.5–5.1)
Sodium: 143 mmol/L (ref 135–145)

## 2024-08-06 ENCOUNTER — Other Ambulatory Visit: Payer: Self-pay | Admitting: *Deleted

## 2024-08-06 DIAGNOSIS — I82411 Acute embolism and thrombosis of right femoral vein: Secondary | ICD-10-CM

## 2024-08-06 MED ORDER — APIXABAN 5 MG PO TABS: 5.0000 mg | ORAL_TABLET | Freq: Two times a day (BID) | ORAL | 5 refills | Status: AC

## 2024-08-06 MED ORDER — APIXABAN 5 MG PO TABS: 5.0000 mg | ORAL_TABLET | Freq: Two times a day (BID) | ORAL | 5 refills | Status: DC

## 2024-08-14 ENCOUNTER — Ambulatory Visit: Admitting: Internal Medicine

## 2024-08-14 ENCOUNTER — Encounter: Payer: Self-pay | Admitting: Internal Medicine

## 2024-08-14 ENCOUNTER — Ambulatory Visit

## 2024-08-14 VITALS — BP 104/56 | HR 76 | Temp 97.2°F | Ht 69.0 in | Wt 149.4 lb

## 2024-08-14 DIAGNOSIS — Z87891 Personal history of nicotine dependence: Secondary | ICD-10-CM | POA: Diagnosis not present

## 2024-08-14 DIAGNOSIS — J9601 Acute respiratory failure with hypoxia: Secondary | ICD-10-CM

## 2024-08-14 DIAGNOSIS — R0609 Other forms of dyspnea: Secondary | ICD-10-CM | POA: Diagnosis not present

## 2024-08-14 NOTE — Patient Instructions (Addendum)
 BREO one click 1st  thing in AM  Make sure you check your oxygen saturation  AT  your highest level of activity (not after you stop)   to be sure it stays over 90% and adjust  02 flow upward to maintain this level if needed but remember to turn it back to previous settings when you stop (to conserve your supply).   Please remember to go to the  x-ray department  for your tests - we will call you with the results when they are available     Please schedule a follow up office visit in 4 weeks, sooner if needed with  PFTS  and  with all medications /inhalers/ solutions in hand so we can verify exactly what you are taking. This includes all medications from all doctors and over the counters

## 2024-08-14 NOTE — Progress Notes (Signed)
 "  Daniel Santos., male    DOB: 1960/11/03   MRN: [id number]   Brief patient profile:  63 yobm  quit smoking 2018  prev seen as inpt only  referred to pulmonary clinic 08/14/2024 by Dr Gretta admitted with new onset resp failure/ started on BREO   Onset of doe at time of transplants of pancreas/ kidney 2021   much worse since  Nov 2025   Admit date:     07/17/2024  Discharge date: 07/22/2024    Recommendations at discharge:     Patient has been placed on furosemide  for diuresis.  Added inhaled corticosteroids and long acting b agonist Added supplemental 02 per [place] to keep 02 saturation 88% or greater.  Follow up renal function and electrolytes in 7 days as outpatient  Follow up with Nell Piety FNP in 7 to 10 days Follow up with Cardiology as scheduled.    Discharge Diagnoses: Principal Problem:   Acute on chronic heart failure with preserved ejection fraction (HFpEF) (HCC)   COPD exacerbation (HCC)   Hypertension   Chronic kidney disease, stage 3a (HCC)   Type 1 diabetes mellitus (HCC)   Hypercholesteremia   History of simultaneous kidney and pancreas transplant (HCC)   History of DVT (deep vein thrombosis)   History of stroke       Hospital Course: Daniel Santos was admitted to the hospital with the working diagnosis of acute heart failure decompensation.    64 yo male with the past medical history of T1DM, sp pancreatic and renal transplantation, coronary artery disease, pulmonary hypertension, history of CVA and DVT, who presented with dyspnea. Patient reported worsening dyspnea on exertion along with lower extremity edema for about 2 days prior to admission, to the point where he was dyspneic with minimal efforts.  Because severe symptoms EMS was called, he was found hypoxemic 70% on room air and with increased work of breathing, he was placed on non re breather mask supplemental 02 and was transported to the ED.  On his initial physical examination his blood pressure  was 115/63, HR 93, RR 33 and 02 saturation 97% on supplemental 02  Lungs with no wheezing, but positive rales and increased work of breathing, heart with S1 and S2 present and regular with no gallops or rubs, abdomen with no distention, and bilateral ankle edema.    VBG 7.31/ 42/ 41/ 20/ 71%  Na 137, K 5.8 Cl 105 bicarbonate 20, glucose 213 bun 36 cr 1,8  Anion gap 12  AST 24 ALT 18  BNP 456  High sensitive troponin 50, 118 , 137   Lactic acid 2,9  Wbc 9.9 hgb 14.7 plt 202    Chest radiograph with hyperinflation, no cardiomegaly, bilateral hilar vascular congestion, cephalization of the vasculature, bilateral central interstitial infiltrates.  Right lower lobe atelectasis    CT chest with no evidence of pulmonary embolus Small to moderate bilateral pleural effusions.  Bilateral ground glass opacities with interlobular septal thickening diffuse and bilateral    EKG 95 bpm, normal axis, normal intervals, qtc 477, sinus rhythm with bilateral atrial enlargement, positive LVH with poor RR wave progression, no significant ST segment or T wave changes.    Patient was placed on furosemide .  Transfer to cardiac ICU for possible cardiogenic shock.  Cardiac catheterization with pulmonary hypertension, but preserved cardiac output and index. Normal filling pressures.    12/13 transferred back to [facility]. Discontinue central line  12/14 resume insulin  pump, possible discharge tomorrow.  12/15 continue  to have increase 02 requirements.  12/16 patient clinically stable for discharge.    Assessment and Plan: * Acute on chronic heart failure with preserved ejection fraction (HFpEF) (HCC) Echocardiogram with preserved LV systolic function with EF 55 to 60%, indeterminate diastolic parameters, RV systolic function preserved, RVSP 60,8 mmHg, mild mitral valve regurgitation.   12/11 cardiac catheterization  RA 7  RV 51/6 PA 53/22 mean 37  PCWP 11 Cardiac output 6.0 and index 3.3 (Fick) Cardiac output  4.4 and index 2.42 (thermodilution)  PVR 5.9 wood units   Precapillary pulmonary hypertension Group 3 pulmonary hypertension    Patient with improved volume status after diuresis.    Continue carvedilol  and furosemide .  Limited medical therapy due to hyperkalemia.    Acute hypoxemic respiratory failure, at rest on 3 L/min with 02 saturation 95%, on ambulation requires 6 L/min to keep 02 saturation above 88%. On room air at rest is 73%  Patient will need to continue supplemental 02 per [place] at home.    COPD exacerbation (HCC) Improved gas exchange.    Plan to continue LABA and ICS Airway clearing techniques with flutter valve and incentive spirometer.  Continue 02 monitoring and supplemental 02 per [place] to keep 02 saturation 88% or greater.     Hypertension Continue with carvedilol  for blood pressure control.     Chronic kidney disease, stage 3a (HCC) AKI, hyperkalemia, hyponatremia    At the time of her discharge his renal function had a serum cr at 1,83 with K at 5,3 and serum bicarbonate at 22 Na 140  He had sodium zirconium this morning and will recheck K prior to his discharge.  Addendum: follow up K is 4,9 with serum cr at 1,56 and bicarbonate at 25, Na 140  Continue furosemide  40 mg po daily and follow up renal function as outpatient    Type 1 diabetes mellitus (HCC) Uncontrolled hyperglycemia  He was diagnosed with DKA in the ICU (now resolved)   Fasting glucose this am 114 mg/dl Plan to continue with insulin   pump with good toleration    Hypercholesteremia Continue with statin    History of simultaneous kidney and pancreas transplant (HCC) Immunosuppressive therapy with prednisone , cyclosporine  and azathioprine .    History of DVT (deep vein thrombosis) Continue anticoagulation with apixaban    History of stroke Continue blood pressure control and statin therapy.   Patient has bee on aspirin                   History of Present Illness  08/14/2024   Pulmonary/ 1st office eval/Fal[facility]  [facility]  Chief Complaint  Patient presents with   COPD    Does okay with light activities as long as uses oxygen.  States some activity does cause stress and patient feels like he is not getting enough oxygen  Dyspnea:  room at room at home on 4-5 lpm  Cough: none  Sleep: flat bed one and a half pillows  sleeps ok SABA use: BREO only     No obvious day to day or daytime pattern/variability or assoc excess/ purulent sputum or mucus plugs or hemoptysis or cp or chest tightness, subjective wheeze or overt sinus or hb symptoms.    Also denies any obvious fluctuation of symptoms with weather or environmental changes or other aggravating or alleviating factors except as outlined above   No unusual exposure hx or h/o childhood pna/ asthma or knowledge of premature birth.  Current Allergies, Complete Past Medical History, Past Surgical History, Family  History, and Social History were reviewed in [employer] record.  ROS  The following are not active complaints unless bolded Hoarseness, sore throat, dysphagia, dental problems, itching, sneezing,  nasal congestion or discharge of excess mucus or purulent secretions, ear ache,   fever, chills, sweats, unintended wt loss or wt gain, classically pleuritic or exertional cp,  orthopnea pnd or arm/hand swelling  or leg swelling, presyncope, palpitations, abdominal pain, anorexia, nausea, vomiting, diarrhea  or change in bowel habits or change in bladder habits, change in stools or change in urine, dysuria, hematuria,  rash, arthralgias, visual complaints, headache, numbness, weakness or ataxia or problems with walking or coordination,  change in mood or  memory.             Outpatient Medications Prior to Visit  Medication Sig Dispense Refill   acetaminophen  (TYLENOL ) 500 MG tablet Take 1,000 mg by mouth every 6 (six) hours as needed (pain).     apixaban  (ELIQUIS ) 5 MG TABS tablet Take 1 tablet (5  mg total) by mouth 2 (two) times daily. Start taking after completion of starter pack. 60 tablet 5   aspirin  EC 81 MG tablet Take 1 tablet (81 mg total) by mouth daily. Swallow whole.     atorvastatin  (LIPITOR) 40 MG tablet Take 1 tablet (40 mg total) every evening by mouth. 30 tablet 2   azaTHIOprine  (IMURAN ) 50 MG tablet Take 2 tablets (100 mg total) by mouth daily. 60 tablet 0   BACTRIM 400-80 MG tablet Take 1 tablet 3 times a week by oral route as directed for 90 days, for M, W, F.     BUTRANS  20 MCG/HR PTWK patch Place 20 mcg onto the skin every Monday.      carvedilol  (COREG ) 12.5 MG tablet TAKE 1 TABLET TWICE DAILY WITH MEALS 180 tablet 3   cycloSPORINE  modified (NEORAL ) 100 MG capsule Take 1 capsule (100 mg total) by mouth 2 (two) times daily. 60 capsule 0   fluticasone  furoate-vilanterol (BREO ELLIPTA ) 200-25 MCG/ACT AEPB Inhale 1 puff into the lungs daily. 60 each 0   furosemide  (LASIX ) 40 MG tablet Take 1 tablet (40 mg total) by mouth daily. 30 tablet 0   insulin  lispro (HUMALOG) 100 UNIT/ML injection See admin instructions. Uses via insulin  pump per bolus rate     LINZESS  145 MCG CAPS capsule Take 145 mcg by mouth daily as needed (for constipation).      predniSONE  (DELTASONE ) 10 MG tablet Take 1 tablet (10 mg total) by mouth daily. 30 tablet 0   tamsulosin  (FLOMAX ) 0.4 MG CAPS capsule Take 0.4 mg by mouth 2 (two) times daily.     No facility-administered medications prior to visit.    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) 03/25/2015   History of non-insulin  dependent diabetes mellitus    Hypercholesteremia    Hypertension    Left arm weakness    Palpitations 12/14/2015   Stroke (HCC)       Objective:     BP  (!) 104/56   Pulse 76   Temp (!) 97.2 F (36.2 C) (Temporal)   Ht 5' 9 (1.753 m)   Wt 149 lb 6.4 oz (  67.8 kg)   SpO2 98% Comment: 4L continuous oxygen (tank)  BMI 22.06 kg/m   SpO2: 98 % (4L continuous oxygen (tank)) w/c elderly bm nad   HEENT : Oropharynx  clear   Nasal turbinates nl    NECK :  without  apparent JVD/ palpable Nodes/TM    LUNGS: no acc muscle use,  Mild barrel  contour chest wall with bilateral  Distant bs s audible wheeze and  without cough on insp or exp maneuvers  and mild  Hyperresonant  to  percussion bilaterally     CV:  RRR  no s3 or murmur or increase in P2, and no edema   ABD:  soft and nontender   MS:  unsteady wide based gait ext warm without deformities Or obvious joint restrictions  calf tenderness, cyanosis or clubbing     SKIN: warm and dry without lesions    NEURO:  alert, approp, nl sensorium with  no motor or cerebellar deficits apparent.    Ct chest w/o contrast  08/12/2021 Confluent centrilobular emphysema. Similar scarring versus chronic atelectasis of the bilateral lower lobes. No suspicious pulmonary nodules. No pleural fusion. No pneumothora   CXR PA and Lateral:   08/14/2024 :    I personally reviewed images and impression is as follows:       Mild copd with non-specific mild  increase in markings Assessment     Assessment & Plan DOE (dyspnea on exertion) Quit smoking 2018 - Ct chest w/o contrast  08/12/2021 Confluent centrilobular emphysema. Similar scarring versus chronic atelectasis of the bilateral lower lobes. No suspicious pulmonary nodules. No pleural fusion. No pneumothorax - See admit  08/18/23 with dx  Pulmonary Hypertensin ? CTEPAH  not excluded > d/c on apixaban  with h/o DVT  - 08/14/2024 Patient able to walk on RA  500 ft  with frequent stops due to fatigue, weakness and unsteady gait. SaO2 97% at lowest   At this point pt is more deconditioned than limited by doe but will need f/u ECHO to see if PH improving and if not  then proceed with VQ and w/u for [facility]  >>>   F/u with PFTs and for now just continue BREO 100  / apixiban and approp 02     Acute respiratory failure with hypoxia (HCC) See admit 07/17/24 - 08/14/2024 no desats walking 500 ft   Advised 2lpm hs for now and daytime continue to monitor with goal of sats > 90% at all times due to [facility]   Retuirn in 4 weeks with all meds in hand using a trust but verify approach to confirm accurate Medication  Reconciliation The principal here is that until we are certain that the  patients are doing what we've asked, it makes no sense to ask them to do more.   Each maintenance medication was reviewed in detail including emphasizing most importantly the difference between maintenance and prns and under what circumstances the prns are to be triggered using an action plan format where appropriate.  Total time for H and P, chart review, counseling, reviewing dpi/ hfa/02/pulse ox  device(s) , directly observing portions of ambulatory 02 saturation study/ and generating customized AVS unique to this office visit / same day charting = 43 min post hosp pt new to me                  AVS  Patient Instructions  BREO one click 1st  thing in AM  Make sure you check your oxygen  saturation  AT  your highest level of activity (not after you stop)   to be sure it stays over 90% and adjust  02 flow upward to maintain this level if needed but remember to turn it back to previous settings when you stop (to conserve your supply).   Please remember to go to the  x-ray department  for your tests - we will call you with the results when they are available     Please schedule a follow up office visit in 4 weeks, sooner if needed with  PFTS  and  with all medications /inhalers/ solutions in hand so we can verify exactly what you are taking. This includes all medications from all doctors and over the counters      Ozell America, MD 08/15/2024     "

## 2024-08-15 NOTE — Assessment & Plan Note (Addendum)
 Quit smoking 2018 - Ct chest w/o contrast  08/12/2021 Confluent centrilobular emphysema. Similar scarring versus chronic atelectasis of the bilateral lower lobes. No suspicious pulmonary nodules. No pleural fusion. No pneumothorax - See admit  08/18/23 with dx  Pulmonary Hypertensin ? CTEPAH  not excluded > d/c on apixaban  with h/o DVT  - 08/14/2024 Patient able to walk on RA  500 ft  with frequent stops due to fatigue, weakness and unsteady gait. SaO2 97% at lowest   At this point pt is more deconditioned than limited by doe but will need f/u ECHO to see if PH improving and if not then proceed with VQ and w/u for [facility]  >>>   F/u with PFTs and for now just continue BREO 100  / apixiban and approp 02

## 2024-08-15 NOTE — Assessment & Plan Note (Signed)
 See admit 07/17/24 - 08/14/2024 no desats walking 500 ft   Advised 2lpm hs for now and daytime continue to monitor with goal of sats > 90% at all times due to [facility]   Retuirn in 4 weeks with all meds in hand using a trust but verify approach to confirm accurate Medication  Reconciliation The principal here is that until we are certain that the  patients are doing what we've asked, it makes no sense to ask them to do more.   Each maintenance medication was reviewed in detail including emphasizing most importantly the difference between maintenance and prns and under what circumstances the prns are to be triggered using an action plan format where appropriate.  Total time for H and P, chart review, counseling, reviewing dpi/ hfa/02/pulse ox  device(s) , directly observing portions of ambulatory 02 saturation study/ and generating customized AVS unique to this office visit / same day charting = 43 min post hosp pt new to me

## 2024-08-22 ENCOUNTER — Ambulatory Visit: Payer: Self-pay | Admitting: Internal Medicine

## 2024-09-17 ENCOUNTER — Encounter

## 2024-09-18 ENCOUNTER — Ambulatory Visit: Admitting: Internal Medicine

## 2025-01-22 ENCOUNTER — Inpatient Hospital Stay: Admitting: Hematology and Oncology

## 2025-01-22 ENCOUNTER — Inpatient Hospital Stay
